# Patient Record
Sex: Male | Born: 1970 | Race: White | Hispanic: No | Marital: Married | Smoking: Current every day smoker
Health system: Southern US, Community
[De-identification: ages and names within clinical notes are randomized; demographics above are authoritative.]

## PROBLEM LIST (undated history)

## (undated) DIAGNOSIS — A0472 Enterocolitis due to Clostridium difficile, not specified as recurrent: Secondary | ICD-10-CM

## (undated) DIAGNOSIS — F10931 Alcohol use, unspecified with withdrawal delirium: Secondary | ICD-10-CM

## (undated) DIAGNOSIS — E039 Hypothyroidism, unspecified: Secondary | ICD-10-CM

## (undated) DIAGNOSIS — F10231 Alcohol dependence with withdrawal delirium: Secondary | ICD-10-CM

## (undated) DIAGNOSIS — B1920 Unspecified viral hepatitis C without hepatic coma: Secondary | ICD-10-CM

## (undated) DIAGNOSIS — R569 Unspecified convulsions: Secondary | ICD-10-CM

## (undated) DIAGNOSIS — G629 Polyneuropathy, unspecified: Secondary | ICD-10-CM

## (undated) DIAGNOSIS — D61818 Other pancytopenia: Secondary | ICD-10-CM

## (undated) DIAGNOSIS — I10 Essential (primary) hypertension: Secondary | ICD-10-CM

## (undated) DIAGNOSIS — K769 Liver disease, unspecified: Secondary | ICD-10-CM

## (undated) DIAGNOSIS — K7031 Alcoholic cirrhosis of liver with ascites: Secondary | ICD-10-CM

## (undated) HISTORY — PX: JOINT REPLACEMENT: SHX530

---

## 2007-05-10 ENCOUNTER — Ambulatory Visit: Payer: Self-pay | Admitting: Emergency Medicine

## 2010-03-19 ENCOUNTER — Ambulatory Visit: Payer: Self-pay | Admitting: Gastroenterology

## 2010-08-07 ENCOUNTER — Ambulatory Visit: Payer: Self-pay | Admitting: Gastroenterology

## 2011-03-28 ENCOUNTER — Ambulatory Visit: Payer: Self-pay | Admitting: Gastroenterology

## 2011-04-22 ENCOUNTER — Ambulatory Visit: Payer: Self-pay | Admitting: Gastroenterology

## 2011-04-23 LAB — PATHOLOGY REPORT

## 2011-09-05 ENCOUNTER — Ambulatory Visit: Payer: Self-pay | Admitting: Internal Medicine

## 2011-09-12 ENCOUNTER — Ambulatory Visit: Payer: Self-pay | Admitting: Internal Medicine

## 2011-09-15 ENCOUNTER — Ambulatory Visit: Payer: Self-pay | Admitting: Internal Medicine

## 2011-10-16 ENCOUNTER — Ambulatory Visit: Payer: Self-pay | Admitting: Internal Medicine

## 2012-10-03 ENCOUNTER — Emergency Department: Payer: Self-pay | Admitting: Emergency Medicine

## 2012-10-03 LAB — CBC WITH DIFFERENTIAL/PLATELET
Basophil #: 0 10*3/uL (ref 0.0–0.1)
Eosinophil #: 0 10*3/uL (ref 0.0–0.7)
Lymphocyte %: 7.4 %
MCH: 38.6 pg — ABNORMAL HIGH (ref 26.0–34.0)
MCV: 111 fL — ABNORMAL HIGH (ref 80–100)
Monocyte %: 8.3 %
Neutrophil %: 83.5 %
Platelet: 64 10*3/uL — ABNORMAL LOW (ref 150–440)
RBC: 3.38 10*6/uL — ABNORMAL LOW (ref 4.40–5.90)
RDW: 14.1 % (ref 11.5–14.5)

## 2012-10-03 LAB — DRUG SCREEN, URINE
Barbiturates, Ur Screen: NEGATIVE (ref ?–200)
Benzodiazepine, Ur Scrn: NEGATIVE (ref ?–200)
Methadone, Ur Screen: NEGATIVE (ref ?–300)
Phencyclidine (PCP) Ur S: NEGATIVE (ref ?–25)
Tricyclic, Ur Screen: NEGATIVE (ref ?–1000)

## 2012-10-03 LAB — BASIC METABOLIC PANEL
Anion Gap: 15 (ref 7–16)
BUN: 15 mg/dL (ref 7–18)
Calcium, Total: 9.3 mg/dL (ref 8.5–10.1)
Creatinine: 1.22 mg/dL (ref 0.60–1.30)
EGFR (African American): >60
EGFR (Non-African Amer.): >60
Glucose: 160 mg/dL — ABNORMAL HIGH (ref 65–99)
Sodium: 139 mmol/L (ref 136–145)

## 2012-10-03 LAB — ETHANOL: Ethanol: <3 mg/dL

## 2012-10-03 LAB — URINALYSIS, COMPLETE
Bacteria: NONE SEEN
Bilirubin,UR: NEGATIVE
Glucose,UR: NEGATIVE mg/dL (ref 0–75)
Nitrite: NEGATIVE
Ph: 9 (ref 4.5–8.0)
Specific Gravity: 1.023 (ref 1.003–1.030)
WBC UR: 2 /HPF (ref 0–5)

## 2012-10-12 ENCOUNTER — Ambulatory Visit: Payer: Self-pay | Admitting: Neurology

## 2012-10-13 ENCOUNTER — Ambulatory Visit: Payer: Self-pay | Admitting: Neurology

## 2014-01-05 ENCOUNTER — Inpatient Hospital Stay: Payer: Self-pay | Admitting: Internal Medicine

## 2014-01-05 LAB — PROTIME-INR
INR: 1.3
PROTHROMBIN TIME: 15.5 s — AB (ref 11.5–14.7)

## 2014-01-05 LAB — COMPREHENSIVE METABOLIC PANEL
ALT: 119 U/L — AB (ref 12–78)
Albumin: 2 g/dL — ABNORMAL LOW (ref 3.4–5.0)
Alkaline Phosphatase: 278 U/L — ABNORMAL HIGH
Anion Gap: 12 (ref 7–16)
BUN: 33 mg/dL — AB (ref 7–18)
Bilirubin,Total: 10.7 mg/dL — ABNORMAL HIGH (ref 0.2–1.0)
CALCIUM: 9.4 mg/dL (ref 8.5–10.1)
CHLORIDE: 86 mmol/L — AB (ref 98–107)
CO2: 23 mmol/L (ref 21–32)
CREATININE: 1.06 mg/dL (ref 0.60–1.30)
EGFR (Non-African Amer.): >60
GLUCOSE: 110 mg/dL — AB (ref 65–99)
OSMOLALITY: 252 (ref 275–301)
POTASSIUM: 4.7 mmol/L (ref 3.5–5.1)
SGOT(AST): 267 U/L — ABNORMAL HIGH (ref 15–37)
Sodium: 121 mmol/L — ABNORMAL LOW (ref 136–145)
Total Protein: 6.5 g/dL (ref 6.4–8.2)

## 2014-01-05 LAB — CBC WITH DIFFERENTIAL/PLATELET
BANDS NEUTROPHIL: 4 %
HCT: 29.1 % — AB (ref 40.0–52.0)
HGB: 9.7 g/dL — ABNORMAL LOW (ref 13.0–18.0)
Lymphocytes: 9 %
MCH: 41.4 pg — ABNORMAL HIGH (ref 26.0–34.0)
MCHC: 33.3 g/dL (ref 32.0–36.0)
MCV: 124 fL — ABNORMAL HIGH (ref 80–100)
Metamyelocyte: 3 %
Monocytes: 5 %
Platelet: 118 10*3/uL — ABNORMAL LOW (ref 150–440)
RBC: 2.34 10*6/uL — ABNORMAL LOW (ref 4.40–5.90)
RDW: 16.5 % — AB (ref 11.5–14.5)
SEGMENTED NEUTROPHILS: 79 %
WBC: 16.4 10*3/uL — AB (ref 3.8–10.6)

## 2014-01-05 LAB — LIPASE, BLOOD: LIPASE: 808 U/L — AB (ref 73–393)

## 2014-01-05 LAB — MAGNESIUM: MAGNESIUM: 1.8 mg/dL

## 2014-01-05 LAB — APTT: Activated PTT: 42.7 secs — ABNORMAL HIGH (ref 23.6–35.9)

## 2014-01-05 LAB — FOLATE: FOLIC ACID: 2.6 ng/mL — AB (ref 3.1–100.0)

## 2014-01-05 LAB — ETHANOL: Ethanol: <3 mg/dL

## 2014-01-05 LAB — TROPONIN I: Troponin-I: <0.02 ng/mL

## 2014-01-05 LAB — AMMONIA: Ammonia, Plasma: 46 mcmol/L — ABNORMAL HIGH (ref 11–32)

## 2014-01-06 LAB — DRUG SCREEN, URINE
AMPHETAMINES, UR SCREEN: NEGATIVE (ref ?–1000)
Barbiturates, Ur Screen: NEGATIVE (ref ?–200)
Benzodiazepine, Ur Scrn: NEGATIVE (ref ?–200)
Cannabinoid 50 Ng, Ur ~~LOC~~: NEGATIVE (ref ?–50)
Cocaine Metabolite,Ur ~~LOC~~: NEGATIVE (ref ?–300)
MDMA (ECSTASY) UR SCREEN: NEGATIVE (ref ?–500)
METHADONE, UR SCREEN: NEGATIVE (ref ?–300)
OPIATE, UR SCREEN: NEGATIVE (ref ?–300)
Phencyclidine (PCP) Ur S: NEGATIVE (ref ?–25)
TRICYCLIC, UR SCREEN: NEGATIVE (ref ?–1000)

## 2014-01-06 LAB — IRON AND TIBC
IRON: 111 ug/dL (ref 65–175)
Iron Bind.Cap.(Total): 96 ug/dL — ABNORMAL LOW (ref 250–450)

## 2014-01-06 LAB — CBC WITH DIFFERENTIAL/PLATELET
Bands: 8 %
HCT: 24.9 % — ABNORMAL LOW (ref 40.0–52.0)
HGB: 8.4 g/dL — ABNORMAL LOW (ref 13.0–18.0)
Lymphocytes: 14 %
MCH: 41.6 pg — ABNORMAL HIGH (ref 26.0–34.0)
MCHC: 33.7 g/dL (ref 32.0–36.0)
MCV: 124 fL — ABNORMAL HIGH (ref 80–100)
Metamyelocyte: 2 %
Monocytes: 3 %
NRBC/100 WBC: 2 /
Platelet: 99 10*3/uL — ABNORMAL LOW (ref 150–440)
RBC: 2.02 10*6/uL — ABNORMAL LOW (ref 4.40–5.90)
RDW: 16.5 % — ABNORMAL HIGH (ref 11.5–14.5)
Segmented Neutrophils: 73 %
WBC: 17.4 10*3/uL — ABNORMAL HIGH (ref 3.8–10.6)

## 2014-01-06 LAB — COMPREHENSIVE METABOLIC PANEL
ALBUMIN: 1.7 g/dL — AB (ref 3.4–5.0)
Alkaline Phosphatase: 236 U/L — ABNORMAL HIGH
Anion Gap: 9 (ref 7–16)
BILIRUBIN TOTAL: 10 mg/dL — AB (ref 0.2–1.0)
BUN: 39 mg/dL — ABNORMAL HIGH (ref 7–18)
CALCIUM: 8.8 mg/dL (ref 8.5–10.1)
Chloride: 89 mmol/L — ABNORMAL LOW (ref 98–107)
Co2: 23 mmol/L (ref 21–32)
Creatinine: 1.14 mg/dL (ref 0.60–1.30)
EGFR (African American): >60
EGFR (Non-African Amer.): >60
Glucose: 126 mg/dL — ABNORMAL HIGH (ref 65–99)
OSMOLALITY: 255 (ref 275–301)
Potassium: 5.1 mmol/L (ref 3.5–5.1)
SGOT(AST): 232 U/L — ABNORMAL HIGH (ref 15–37)
SGPT (ALT): 100 U/L — ABNORMAL HIGH (ref 12–78)
Sodium: 121 mmol/L — ABNORMAL LOW (ref 136–145)
Total Protein: 5.9 g/dL — ABNORMAL LOW (ref 6.4–8.2)

## 2014-01-06 LAB — URINALYSIS, COMPLETE
BACTERIA: NONE SEEN
GLUCOSE, UR: NEGATIVE mg/dL (ref 0–75)
LEUKOCYTE ESTERASE: NEGATIVE
Nitrite: NEGATIVE
PROTEIN: NEGATIVE
Ph: 6 (ref 4.5–8.0)
RBC,UR: 1 /HPF (ref 0–5)
Specific Gravity: 1.011 (ref 1.003–1.030)
Squamous Epithelial: 2
WBC UR: 4 /HPF (ref 0–5)

## 2014-01-06 LAB — FERRITIN: Ferritin (ARMC): 1303 ng/mL — ABNORMAL HIGH (ref 8–388)

## 2014-01-06 LAB — TSH: Thyroid Stimulating Horm: 24.4 u[IU]/mL — ABNORMAL HIGH

## 2014-01-06 LAB — OSMOLALITY, URINE: Osmolality: 291 mOsm/kg

## 2014-01-06 LAB — OSMOLALITY: Osmolality: 276 mOsm/kg (ref 275–295)

## 2014-01-07 LAB — BASIC METABOLIC PANEL
Anion Gap: 10 (ref 7–16)
BUN: 39 mg/dL — ABNORMAL HIGH (ref 7–18)
CHLORIDE: 94 mmol/L — AB (ref 98–107)
Calcium, Total: 9 mg/dL (ref 8.5–10.1)
Co2: 22 mmol/L (ref 21–32)
Creatinine: 1.19 mg/dL (ref 0.60–1.30)
EGFR (Non-African Amer.): >60
Glucose: 120 mg/dL — ABNORMAL HIGH (ref 65–99)
OSMOLALITY: 264 (ref 275–301)
POTASSIUM: 4.4 mmol/L (ref 3.5–5.1)
Sodium: 126 mmol/L — ABNORMAL LOW (ref 136–145)

## 2014-01-07 LAB — CBC WITH DIFFERENTIAL/PLATELET
Bands: 3 %
HCT: 24.1 % — AB (ref 40.0–52.0)
HGB: 8 g/dL — ABNORMAL LOW (ref 13.0–18.0)
LYMPHS PCT: 11 %
MCH: 40.9 pg — ABNORMAL HIGH (ref 26.0–34.0)
MCHC: 33.2 g/dL (ref 32.0–36.0)
MCV: 123 fL — AB (ref 80–100)
Monocytes: 10 %
NRBC/100 WBC: 11 /
PLATELETS: 100 10*3/uL — AB (ref 150–440)
RBC: 1.96 10*6/uL — ABNORMAL LOW (ref 4.40–5.90)
RDW: 16.5 % — ABNORMAL HIGH (ref 11.5–14.5)
Segmented Neutrophils: 76 %
WBC: 18.6 10*3/uL — AB (ref 3.8–10.6)

## 2014-01-07 LAB — URINALYSIS, COMPLETE
Glucose,UR: 50 mg/dL (ref 0–75)
Nitrite: NEGATIVE
Ph: 5 (ref 4.5–8.0)
Protein: NEGATIVE
RBC,UR: 78 /HPF (ref 0–5)
Specific Gravity: 1.036 (ref 1.003–1.030)
Squamous Epithelial: 1

## 2014-01-07 LAB — AMMONIA: Ammonia, Plasma: <10 mcmol/L (ref 11–32)

## 2014-01-10 LAB — CBC WITH DIFFERENTIAL/PLATELET
BANDS NEUTROPHIL: 3 %
HCT: 25.5 % — ABNORMAL LOW (ref 40.0–52.0)
HGB: 8.2 g/dL — ABNORMAL LOW (ref 13.0–18.0)
Lymphocytes: 3 %
MCH: 40.5 pg — ABNORMAL HIGH (ref 26.0–34.0)
MCHC: 32.1 g/dL (ref 32.0–36.0)
MCV: 126 fL — ABNORMAL HIGH (ref 80–100)
Monocytes: 3 %
NRBC/100 WBC: 1 /
PLATELETS: 92 10*3/uL — AB (ref 150–440)
RBC: 2.02 10*6/uL — ABNORMAL LOW (ref 4.40–5.90)
RDW: 18.1 % — ABNORMAL HIGH (ref 11.5–14.5)
Segmented Neutrophils: 91 %
WBC: 30.2 10*3/uL — ABNORMAL HIGH (ref 3.8–10.6)

## 2014-01-10 LAB — COMPREHENSIVE METABOLIC PANEL
ALT: 85 U/L — AB (ref 12–78)
Albumin: 1.9 g/dL — ABNORMAL LOW (ref 3.4–5.0)
Alkaline Phosphatase: 201 U/L — ABNORMAL HIGH
Anion Gap: 9 (ref 7–16)
BUN: 40 mg/dL — ABNORMAL HIGH (ref 7–18)
Bilirubin,Total: 11.5 mg/dL — ABNORMAL HIGH (ref 0.2–1.0)
CALCIUM: 8.8 mg/dL (ref 8.5–10.1)
Chloride: 98 mmol/L (ref 98–107)
Co2: 27 mmol/L (ref 21–32)
Creatinine: 1.18 mg/dL (ref 0.60–1.30)
EGFR (Non-African Amer.): >60
GLUCOSE: 151 mg/dL — AB (ref 65–99)
OSMOLALITY: 281 (ref 275–301)
POTASSIUM: 3.4 mmol/L — AB (ref 3.5–5.1)
SGOT(AST): 151 U/L — ABNORMAL HIGH (ref 15–37)
Sodium: 134 mmol/L — ABNORMAL LOW (ref 136–145)
Total Protein: 5.7 g/dL — ABNORMAL LOW (ref 6.4–8.2)

## 2014-01-10 LAB — OCCULT BLOOD X 1 CARD TO LAB, STOOL: OCCULT BLOOD, FECES: POSITIVE

## 2014-01-10 LAB — AMMONIA: Ammonia, Plasma: <10 mcmol/L (ref 11–32)

## 2014-01-11 LAB — CBC WITH DIFFERENTIAL/PLATELET
BANDS NEUTROPHIL: 5 %
Comment - H1-Com6: NORMAL
HCT: 29.4 % — ABNORMAL LOW (ref 40.0–52.0)
HGB: 9.3 g/dL — ABNORMAL LOW (ref 13.0–18.0)
Lymphocytes: 2 %
MCH: 39.5 pg — ABNORMAL HIGH (ref 26.0–34.0)
MCHC: 31.5 g/dL — ABNORMAL LOW (ref 32.0–36.0)
MCV: 126 fL — ABNORMAL HIGH (ref 80–100)
MYELOCYTE: 1 %
Metamyelocyte: 2 %
Monocytes: 2 %
NRBC/100 WBC: 1 /
Platelet: 110 10*3/uL — ABNORMAL LOW (ref 150–440)
RBC: 2.34 10*6/uL — ABNORMAL LOW (ref 4.40–5.90)
RDW: 17.3 % — AB (ref 11.5–14.5)
Segmented Neutrophils: 88 %
WBC: 31.1 10*3/uL — AB (ref 3.8–10.6)

## 2014-01-11 LAB — OCCULT BLOOD X 1 CARD TO LAB, STOOL: Occult Blood, Feces: POSITIVE

## 2014-01-13 LAB — CBC WITH DIFFERENTIAL/PLATELET
Bands: 2 %
HCT: 27.5 % — ABNORMAL LOW (ref 40.0–52.0)
HGB: 9 g/dL — AB (ref 13.0–18.0)
LYMPHS PCT: 2 %
MCH: 40.5 pg — AB (ref 26.0–34.0)
MCHC: 32.5 g/dL (ref 32.0–36.0)
MCV: 124 fL — AB (ref 80–100)
MONOS PCT: 4 %
Myelocyte: 2 %
NRBC/100 WBC: 3 /
PLATELETS: 122 10*3/uL — AB (ref 150–440)
RBC: 2.21 10*6/uL — ABNORMAL LOW (ref 4.40–5.90)
RDW: 17 % — ABNORMAL HIGH (ref 11.5–14.5)
SEGMENTED NEUTROPHILS: 90 %
WBC: 30.7 10*3/uL — ABNORMAL HIGH (ref 3.8–10.6)

## 2014-01-13 LAB — COMPREHENSIVE METABOLIC PANEL
ALT: 109 U/L — AB (ref 12–78)
AST: 172 U/L — AB (ref 15–37)
Albumin: 1.9 g/dL — ABNORMAL LOW (ref 3.4–5.0)
Alkaline Phosphatase: 208 U/L — ABNORMAL HIGH
Anion Gap: 8 (ref 7–16)
BILIRUBIN TOTAL: 11.3 mg/dL — AB (ref 0.2–1.0)
BUN: 47 mg/dL — ABNORMAL HIGH (ref 7–18)
Calcium, Total: 8.9 mg/dL (ref 8.5–10.1)
Chloride: 96 mmol/L — ABNORMAL LOW (ref 98–107)
Co2: 29 mmol/L (ref 21–32)
Creatinine: 1.47 mg/dL — ABNORMAL HIGH (ref 0.60–1.30)
EGFR (African American): >60
EGFR (Non-African Amer.): 58 — ABNORMAL LOW
GLUCOSE: 157 mg/dL — AB (ref 65–99)
Osmolality: 282 (ref 275–301)
Potassium: 3.1 mmol/L — ABNORMAL LOW (ref 3.5–5.1)
SODIUM: 133 mmol/L — AB (ref 136–145)
Total Protein: 5.8 g/dL — ABNORMAL LOW (ref 6.4–8.2)

## 2014-01-13 LAB — PROTIME-INR
INR: 1.3
Prothrombin Time: 15.9 secs — ABNORMAL HIGH (ref 11.5–14.7)

## 2014-01-14 LAB — BASIC METABOLIC PANEL
ANION GAP: 7 (ref 7–16)
BUN: 42 mg/dL — AB (ref 7–18)
CREATININE: 1.31 mg/dL — AB (ref 0.60–1.30)
Calcium, Total: 8.6 mg/dL (ref 8.5–10.1)
Chloride: 95 mmol/L — ABNORMAL LOW (ref 98–107)
Co2: 29 mmol/L (ref 21–32)
EGFR (African American): >60
EGFR (Non-African Amer.): >60
Glucose: 205 mg/dL — ABNORMAL HIGH (ref 65–99)
OSMOLALITY: 279 (ref 275–301)
Potassium: 3.1 mmol/L — ABNORMAL LOW (ref 3.5–5.1)
SODIUM: 131 mmol/L — AB (ref 136–145)

## 2014-01-14 LAB — OCCULT BLOOD X 1 CARD TO LAB, STOOL: Occult Blood, Feces: NEGATIVE

## 2014-01-14 LAB — CLOSTRIDIUM DIFFICILE(ARMC)

## 2014-01-15 LAB — BASIC METABOLIC PANEL
Anion Gap: 7 (ref 7–16)
BUN: 35 mg/dL — ABNORMAL HIGH (ref 7–18)
CALCIUM: 8.9 mg/dL (ref 8.5–10.1)
CHLORIDE: 97 mmol/L — AB (ref 98–107)
CO2: 31 mmol/L (ref 21–32)
Creatinine: 0.97 mg/dL (ref 0.60–1.30)
EGFR (Non-African Amer.): >60
Glucose: 162 mg/dL — ABNORMAL HIGH (ref 65–99)
OSMOLALITY: 282 (ref 275–301)
Potassium: 3.2 mmol/L — ABNORMAL LOW (ref 3.5–5.1)
SODIUM: 135 mmol/L — AB (ref 136–145)

## 2014-01-15 LAB — CBC WITH DIFFERENTIAL/PLATELET
Bands: 2 %
Comment - H1-Com3: NORMAL
HCT: 28.9 % — ABNORMAL LOW (ref 40.0–52.0)
HGB: 9.3 g/dL — ABNORMAL LOW (ref 13.0–18.0)
LYMPHS PCT: 3 %
MCH: 39.6 pg — ABNORMAL HIGH (ref 26.0–34.0)
MCHC: 32.1 g/dL (ref 32.0–36.0)
MCV: 124 fL — AB (ref 80–100)
METAMYELOCYTE: 2 %
MONOS PCT: 7 %
Myelocyte: 1 %
Platelet: 116 10*3/uL — ABNORMAL LOW (ref 150–440)
RBC: 2.34 10*6/uL — AB (ref 4.40–5.90)
RDW: 17 % — ABNORMAL HIGH (ref 11.5–14.5)
Segmented Neutrophils: 85 %
WBC: 32 10*3/uL — AB (ref 3.8–10.6)

## 2014-01-16 LAB — CBC WITH DIFFERENTIAL/PLATELET
BASOS ABS: 2.9 10*3/uL — AB (ref 0.0–0.1)
BASOS PCT: 9.8 %
Bands: 4 %
Comment - H1-Com1: NORMAL
Eosinophil #: 0 10*3/uL (ref 0.0–0.7)
Eosinophil %: 0 %
HCT: 26.9 % — AB (ref 40.0–52.0)
HGB: 8.8 g/dL — AB (ref 13.0–18.0)
LYMPHS ABS: 0.5 10*3/uL — AB (ref 1.0–3.6)
LYMPHS PCT: 4 %
Lymphocyte %: 1.8 %
MCH: 40.5 pg — AB (ref 26.0–34.0)
MCHC: 32.8 g/dL (ref 32.0–36.0)
MCV: 124 fL — AB (ref 80–100)
Metamyelocyte: 2 %
Monocyte #: 1.4 x10 3/mm — ABNORMAL HIGH (ref 0.2–1.0)
Monocyte %: 4.8 %
Monocytes: 4 %
NEUTROS ABS: 24.4 10*3/uL — AB (ref 1.4–6.5)
NEUTROS PCT: 83.6 %
Platelet: 109 10*3/uL — ABNORMAL LOW (ref 150–440)
RBC: 2.17 10*6/uL — AB (ref 4.40–5.90)
RDW: 16.6 % — ABNORMAL HIGH (ref 11.5–14.5)
Segmented Neutrophils: 82 %
WBC: 29.2 10*3/uL — ABNORMAL HIGH (ref 3.8–10.6)

## 2014-01-16 LAB — HEPATIC FUNCTION PANEL A (ARMC)
AST: 155 U/L — AB (ref 15–37)
Albumin: 1.8 g/dL — ABNORMAL LOW (ref 3.4–5.0)
Alkaline Phosphatase: 189 U/L — ABNORMAL HIGH
BILIRUBIN DIRECT: 8 mg/dL — AB (ref 0.00–0.20)
BILIRUBIN TOTAL: 9.9 mg/dL — AB (ref 0.2–1.0)
SGPT (ALT): 125 U/L — ABNORMAL HIGH (ref 12–78)
Total Protein: 5.6 g/dL — ABNORMAL LOW (ref 6.4–8.2)

## 2014-01-17 LAB — CBC WITH DIFFERENTIAL/PLATELET
BASOS ABS: 0.4 10*3/uL — AB (ref 0.0–0.1)
Basophil %: 1.2 %
Eosinophil #: 0 10*3/uL (ref 0.0–0.7)
Eosinophil %: 0 %
HCT: 28.1 % — ABNORMAL LOW (ref 40.0–52.0)
HGB: 9.3 g/dL — AB (ref 13.0–18.0)
LYMPHS PCT: 1.9 %
Lymphocyte #: 0.6 10*3/uL — ABNORMAL LOW (ref 1.0–3.6)
MCH: 40.7 pg — ABNORMAL HIGH (ref 26.0–34.0)
MCHC: 33 g/dL (ref 32.0–36.0)
MCV: 123 fL — ABNORMAL HIGH (ref 80–100)
MONO ABS: 1.2 x10 3/mm — AB (ref 0.2–1.0)
Monocyte %: 3.9 %
Neutrophil #: 28.5 10*3/uL — ABNORMAL HIGH (ref 1.4–6.5)
Neutrophil %: 93 %
Platelet: 100 10*3/uL — ABNORMAL LOW (ref 150–440)
RBC: 2.28 10*6/uL — ABNORMAL LOW (ref 4.40–5.90)
RDW: 16.4 % — ABNORMAL HIGH (ref 11.5–14.5)
WBC: 30.7 10*3/uL — ABNORMAL HIGH (ref 3.8–10.6)

## 2014-01-17 LAB — CULTURE, BLOOD (SINGLE)

## 2014-02-06 ENCOUNTER — Inpatient Hospital Stay: Payer: Self-pay | Admitting: Internal Medicine

## 2014-02-06 LAB — COMPREHENSIVE METABOLIC PANEL
ANION GAP: 6 — AB (ref 7–16)
AST: 116 U/L — AB (ref 15–37)
Albumin: 2.1 g/dL — ABNORMAL LOW (ref 3.4–5.0)
Alkaline Phosphatase: 213 U/L — ABNORMAL HIGH
BUN: 29 mg/dL — ABNORMAL HIGH (ref 7–18)
Bilirubin,Total: 3.8 mg/dL — ABNORMAL HIGH (ref 0.2–1.0)
Calcium, Total: 8.2 mg/dL — ABNORMAL LOW (ref 8.5–10.1)
Chloride: 103 mmol/L (ref 98–107)
Co2: 30 mmol/L (ref 21–32)
Creatinine: 0.84 mg/dL (ref 0.60–1.30)
Glucose: 166 mg/dL — ABNORMAL HIGH (ref 65–99)
Osmolality: 287 (ref 275–301)
Potassium: 3.5 mmol/L (ref 3.5–5.1)
SGPT (ALT): 127 U/L — ABNORMAL HIGH (ref 12–78)
Sodium: 139 mmol/L (ref 136–145)
Total Protein: 5.9 g/dL — ABNORMAL LOW (ref 6.4–8.2)

## 2014-02-06 LAB — PROTIME-INR
INR: 1.2
PROTHROMBIN TIME: 14.7 s (ref 11.5–14.7)

## 2014-02-06 LAB — URINALYSIS, COMPLETE
BACTERIA: NONE SEEN
Bilirubin,UR: NEGATIVE
Blood: NEGATIVE
Glucose,UR: NEGATIVE mg/dL (ref 0–75)
Hyaline Cast: 3
KETONE: NEGATIVE
Leukocyte Esterase: NEGATIVE
NITRITE: NEGATIVE
PROTEIN: NEGATIVE
Ph: 5 (ref 4.5–8.0)
RBC,UR: <1 /HPF (ref 0–5)
Specific Gravity: 1.011 (ref 1.003–1.030)
Squamous Epithelial: <1
WBC UR: 1 /HPF (ref 0–5)

## 2014-02-06 LAB — CBC WITH DIFFERENTIAL/PLATELET
Basophil #: 0 10*3/uL (ref 0.0–0.1)
Basophil %: 0.2 %
EOS ABS: 0 10*3/uL (ref 0.0–0.7)
EOS PCT: 0.1 %
HCT: 29.6 % — ABNORMAL LOW (ref 40.0–52.0)
HGB: 9.3 g/dL — AB (ref 13.0–18.0)
Lymphocyte #: 0.6 10*3/uL — ABNORMAL LOW (ref 1.0–3.6)
Lymphocyte %: 2.4 %
MCH: 36.9 pg — AB (ref 26.0–34.0)
MCHC: 31.5 g/dL — AB (ref 32.0–36.0)
MCV: 117 fL — AB (ref 80–100)
MONO ABS: 0.7 x10 3/mm (ref 0.2–1.0)
Monocyte %: 3.2 %
NEUTROS ABS: 21.5 10*3/uL — AB (ref 1.4–6.5)
Neutrophil %: 94.1 %
Platelet: 78 10*3/uL — ABNORMAL LOW (ref 150–440)
RBC: 2.53 10*6/uL — ABNORMAL LOW (ref 4.40–5.90)
RDW: 14.7 % — ABNORMAL HIGH (ref 11.5–14.5)
WBC: 22.9 10*3/uL — ABNORMAL HIGH (ref 3.8–10.6)

## 2014-02-06 LAB — ETHANOL

## 2014-02-06 LAB — DRUG SCREEN, URINE

## 2014-02-06 LAB — LIPASE, BLOOD: Lipase: 169 U/L (ref 73–393)

## 2014-02-06 LAB — TROPONIN I: Troponin-I: 0.03 ng/mL

## 2014-02-06 LAB — AMMONIA: Ammonia, Plasma: 43 mcmol/L — ABNORMAL HIGH (ref 11–32)

## 2014-02-06 LAB — APTT: Activated PTT: 35.7 secs (ref 23.6–35.9)

## 2014-02-06 LAB — MAGNESIUM: Magnesium: 1.2 mg/dL — ABNORMAL LOW

## 2014-02-07 LAB — COMPREHENSIVE METABOLIC PANEL
ALBUMIN: 1.8 g/dL — AB (ref 3.4–5.0)
ALK PHOS: 227 U/L — AB
Anion Gap: 6 — ABNORMAL LOW (ref 7–16)
BUN: 25 mg/dL — AB (ref 7–18)
Bilirubin,Total: 3.3 mg/dL — ABNORMAL HIGH (ref 0.2–1.0)
Calcium, Total: 7.9 mg/dL — ABNORMAL LOW (ref 8.5–10.1)
Chloride: 101 mmol/L (ref 98–107)
Co2: 32 mmol/L (ref 21–32)
Creatinine: 0.88 mg/dL (ref 0.60–1.30)
EGFR (African American): >60
Glucose: 134 mg/dL — ABNORMAL HIGH (ref 65–99)
Osmolality: 284 (ref 275–301)
POTASSIUM: 3.2 mmol/L — AB (ref 3.5–5.1)
SGOT(AST): 92 U/L — ABNORMAL HIGH (ref 15–37)
SGPT (ALT): 106 U/L — ABNORMAL HIGH (ref 12–78)
Sodium: 139 mmol/L (ref 136–145)
Total Protein: 5.5 g/dL — ABNORMAL LOW (ref 6.4–8.2)

## 2014-02-07 LAB — CBC WITH DIFFERENTIAL/PLATELET
Basophil #: 0.1 10*3/uL (ref 0.0–0.1)
Basophil %: 0.3 %
Eosinophil #: 0 10*3/uL (ref 0.0–0.7)
Eosinophil %: 0 %
HCT: 24.7 % — AB (ref 40.0–52.0)
HGB: 8.2 g/dL — AB (ref 13.0–18.0)
Lymphocyte #: 0.8 10*3/uL — ABNORMAL LOW (ref 1.0–3.6)
Lymphocyte %: 4 %
MCH: 38.6 pg — ABNORMAL HIGH (ref 26.0–34.0)
MCHC: 33 g/dL (ref 32.0–36.0)
MCV: 117 fL — ABNORMAL HIGH (ref 80–100)
MONO ABS: 0.9 x10 3/mm (ref 0.2–1.0)
MONOS PCT: 4.4 %
Neutrophil #: 17.7 10*3/uL — ABNORMAL HIGH (ref 1.4–6.5)
Neutrophil %: 91.3 %
PLATELETS: 63 10*3/uL — AB (ref 150–440)
RBC: 2.11 10*6/uL — AB (ref 4.40–5.90)
RDW: 14.6 % — ABNORMAL HIGH (ref 11.5–14.5)
WBC: 19.4 10*3/uL — AB (ref 3.8–10.6)

## 2014-02-07 LAB — AMMONIA: Ammonia, Plasma: 81 mcmol/L — ABNORMAL HIGH (ref 11–32)

## 2014-02-07 LAB — MAGNESIUM: Magnesium: 1.6 mg/dL — ABNORMAL LOW

## 2014-02-08 LAB — CBC WITH DIFFERENTIAL/PLATELET
Basophil #: 0 10*3/uL (ref 0.0–0.1)
Basophil %: 0.3 %
Eosinophil #: 0 10*3/uL (ref 0.0–0.7)
Eosinophil %: 0.1 %
HCT: 26.1 % — AB (ref 40.0–52.0)
HGB: 8.8 g/dL — AB (ref 13.0–18.0)
Lymphocyte #: 0.9 10*3/uL — ABNORMAL LOW (ref 1.0–3.6)
Lymphocyte %: 6.3 %
MCH: 38.9 pg — AB (ref 26.0–34.0)
MCHC: 33.6 g/dL (ref 32.0–36.0)
MCV: 116 fL — AB (ref 80–100)
Monocyte #: 0.9 x10 3/mm (ref 0.2–1.0)
Monocyte %: 5.9 %
NEUTROS ABS: 13.1 10*3/uL — AB (ref 1.4–6.5)
Neutrophil %: 87.4 %
Platelet: 59 10*3/uL — ABNORMAL LOW (ref 150–440)
RBC: 2.26 10*6/uL — ABNORMAL LOW (ref 4.40–5.90)
RDW: 14.4 % (ref 11.5–14.5)
WBC: 14.9 10*3/uL — ABNORMAL HIGH (ref 3.8–10.6)

## 2014-02-08 LAB — BODY FLUID CELL COUNT WITH DIFFERENTIAL
Basophil: 0 %
EOS PCT: 0 %
LYMPHS PCT: 21 %
Neutrophils: 56 %
Nucleated Cell Count: 277 /mm3
OTHER CELLS BF: 0 %
OTHER MONONUCLEAR CELLS: 23 %

## 2014-02-08 LAB — ALBUMIN, FLUID (OTHER): Body Fluid Albumin: 0.6 g/dL

## 2014-02-08 LAB — PROTEIN, BODY FLUID: Protein, Body Fluid: 1.2 g/dL

## 2014-02-09 LAB — MAGNESIUM: MAGNESIUM: 1 mg/dL — AB

## 2014-02-09 LAB — COMPREHENSIVE METABOLIC PANEL
ALK PHOS: 225 U/L — AB
ANION GAP: 3 — AB (ref 7–16)
Albumin: 2 g/dL — ABNORMAL LOW (ref 3.4–5.0)
BUN: 21 mg/dL — ABNORMAL HIGH (ref 7–18)
Bilirubin,Total: 2.8 mg/dL — ABNORMAL HIGH (ref 0.2–1.0)
Calcium, Total: 7.8 mg/dL — ABNORMAL LOW (ref 8.5–10.1)
Chloride: 101 mmol/L (ref 98–107)
Co2: 36 mmol/L — ABNORMAL HIGH (ref 21–32)
Creatinine: 0.91 mg/dL (ref 0.60–1.30)
EGFR (African American): >60
EGFR (Non-African Amer.): >60
Glucose: 177 mg/dL — ABNORMAL HIGH (ref 65–99)
Osmolality: 287 (ref 275–301)
Potassium: 3.2 mmol/L — ABNORMAL LOW (ref 3.5–5.1)
SGOT(AST): 74 U/L — ABNORMAL HIGH (ref 15–37)
SGPT (ALT): 86 U/L — ABNORMAL HIGH (ref 12–78)
Sodium: 140 mmol/L (ref 136–145)
TOTAL PROTEIN: 5.6 g/dL — AB (ref 6.4–8.2)

## 2014-02-09 LAB — AMMONIA: Ammonia, Plasma: 66 mcmol/L — ABNORMAL HIGH (ref 11–32)

## 2014-02-10 LAB — CBC WITH DIFFERENTIAL/PLATELET
BASOS PCT: 0.4 %
Basophil #: 0.1 10*3/uL (ref 0.0–0.1)
Eosinophil #: 0 10*3/uL (ref 0.0–0.7)
Eosinophil %: 0.1 %
HCT: 27 % — ABNORMAL LOW (ref 40.0–52.0)
HGB: 8.9 g/dL — ABNORMAL LOW (ref 13.0–18.0)
LYMPHS ABS: 1 10*3/uL (ref 1.0–3.6)
LYMPHS PCT: 4.7 %
MCH: 38.1 pg — ABNORMAL HIGH (ref 26.0–34.0)
MCHC: 33 g/dL (ref 32.0–36.0)
MCV: 115 fL — AB (ref 80–100)
MONO ABS: 1 x10 3/mm (ref 0.2–1.0)
Monocyte %: 4.7 %
Neutrophil #: 18.5 10*3/uL — ABNORMAL HIGH (ref 1.4–6.5)
Neutrophil %: 90.1 %
PLATELETS: 61 10*3/uL — AB (ref 150–440)
RBC: 2.34 10*6/uL — AB (ref 4.40–5.90)
RDW: 14.7 % — ABNORMAL HIGH (ref 11.5–14.5)
WBC: 20.6 10*3/uL — ABNORMAL HIGH (ref 3.8–10.6)

## 2014-02-10 LAB — COMPREHENSIVE METABOLIC PANEL
ALK PHOS: 236 U/L — AB
ALT: 87 U/L — AB (ref 12–78)
ANION GAP: 2 — AB (ref 7–16)
AST: 66 U/L — AB (ref 15–37)
Albumin: 2 g/dL — ABNORMAL LOW (ref 3.4–5.0)
BUN: 18 mg/dL (ref 7–18)
Bilirubin,Total: 2.6 mg/dL — ABNORMAL HIGH (ref 0.2–1.0)
CALCIUM: 8.3 mg/dL — AB (ref 8.5–10.1)
CO2: 38 mmol/L — AB (ref 21–32)
Chloride: 103 mmol/L (ref 98–107)
Creatinine: 0.79 mg/dL (ref 0.60–1.30)
EGFR (African American): >60
Glucose: 108 mg/dL — ABNORMAL HIGH (ref 65–99)
Osmolality: 287 (ref 275–301)
Potassium: 3.1 mmol/L — ABNORMAL LOW (ref 3.5–5.1)
SODIUM: 143 mmol/L (ref 136–145)
TOTAL PROTEIN: 6 g/dL — AB (ref 6.4–8.2)

## 2014-02-10 LAB — AMMONIA: Ammonia, Plasma: 49 mcmol/L — ABNORMAL HIGH (ref 11–32)

## 2014-02-11 LAB — POTASSIUM: POTASSIUM: 3.2 mmol/L — AB (ref 3.5–5.1)

## 2014-02-11 LAB — MAGNESIUM: MAGNESIUM: 1.1 mg/dL — AB

## 2014-02-11 LAB — CULTURE, BLOOD (SINGLE)

## 2014-02-12 ENCOUNTER — Ambulatory Visit: Payer: Self-pay | Admitting: Internal Medicine

## 2014-02-12 LAB — CBC WITH DIFFERENTIAL/PLATELET
BASOS PCT: 0.9 %
Basophil #: 0.2 10*3/uL — ABNORMAL HIGH (ref 0.0–0.1)
EOS ABS: 0.1 10*3/uL (ref 0.0–0.7)
EOS PCT: 0.5 %
HCT: 25.6 % — ABNORMAL LOW (ref 40.0–52.0)
HGB: 8.5 g/dL — ABNORMAL LOW (ref 13.0–18.0)
LYMPHS ABS: 1.1 10*3/uL (ref 1.0–3.6)
Lymphocyte %: 7 %
MCH: 38.3 pg — ABNORMAL HIGH (ref 26.0–34.0)
MCHC: 33.3 g/dL (ref 32.0–36.0)
MCV: 115 fL — AB (ref 80–100)
MONOS PCT: 4.1 %
Monocyte #: 0.7 x10 3/mm (ref 0.2–1.0)
Neutrophil #: 14.3 10*3/uL — ABNORMAL HIGH (ref 1.4–6.5)
Neutrophil %: 87.5 %
PLATELETS: 69 10*3/uL — AB (ref 150–440)
RBC: 2.23 10*6/uL — ABNORMAL LOW (ref 4.40–5.90)
RDW: 15.1 % — ABNORMAL HIGH (ref 11.5–14.5)
WBC: 16.3 10*3/uL — AB (ref 3.8–10.6)

## 2014-02-12 LAB — BASIC METABOLIC PANEL
Anion Gap: 1 — ABNORMAL LOW (ref 7–16)
BUN: 15 mg/dL (ref 7–18)
CHLORIDE: 101 mmol/L (ref 98–107)
CREATININE: 0.73 mg/dL (ref 0.60–1.30)
Calcium, Total: 7.9 mg/dL — ABNORMAL LOW (ref 8.5–10.1)
Co2: 39 mmol/L — ABNORMAL HIGH (ref 21–32)
EGFR (African American): >60
GLUCOSE: 93 mg/dL (ref 65–99)
Osmolality: 282 (ref 275–301)
POTASSIUM: 3.1 mmol/L — AB (ref 3.5–5.1)
SODIUM: 141 mmol/L (ref 136–145)

## 2014-02-12 LAB — MAGNESIUM: Magnesium: 1.2 mg/dL — ABNORMAL LOW

## 2014-02-12 LAB — TSH: Thyroid Stimulating Horm: 60.4 u[IU]/mL — ABNORMAL HIGH

## 2014-02-12 LAB — BODY FLUID CULTURE

## 2014-02-12 LAB — AMMONIA: Ammonia, Plasma: 44 mcmol/L — ABNORMAL HIGH (ref 11–32)

## 2014-02-13 LAB — MAGNESIUM: MAGNESIUM: 1.3 mg/dL — AB

## 2014-02-13 LAB — POTASSIUM: Potassium: 3.3 mmol/L — ABNORMAL LOW (ref 3.5–5.1)

## 2014-02-14 LAB — CBC WITH DIFFERENTIAL/PLATELET
Basophil #: 0 10*3/uL (ref 0.0–0.1)
Basophil %: 0.2 %
Eosinophil #: 0.1 10*3/uL (ref 0.0–0.7)
Eosinophil %: 0.5 %
HCT: 26.7 % — ABNORMAL LOW (ref 40.0–52.0)
HGB: 8.9 g/dL — ABNORMAL LOW (ref 13.0–18.0)
Lymphocyte #: 1.1 10*3/uL (ref 1.0–3.6)
Lymphocyte %: 5.7 %
MCH: 38.2 pg — ABNORMAL HIGH (ref 26.0–34.0)
MCHC: 33.1 g/dL (ref 32.0–36.0)
MCV: 115 fL — ABNORMAL HIGH (ref 80–100)
MONOS PCT: 3 %
Monocyte #: 0.6 x10 3/mm (ref 0.2–1.0)
NEUTROS PCT: 90.6 %
Neutrophil #: 18 10*3/uL — ABNORMAL HIGH (ref 1.4–6.5)
PLATELETS: 87 10*3/uL — AB (ref 150–440)
RBC: 2.32 10*6/uL — ABNORMAL LOW (ref 4.40–5.90)
RDW: 15.2 % — ABNORMAL HIGH (ref 11.5–14.5)
WBC: 19.8 10*3/uL — ABNORMAL HIGH (ref 3.8–10.6)

## 2014-02-14 LAB — BASIC METABOLIC PANEL
Anion Gap: 3 — ABNORMAL LOW (ref 7–16)
BUN: 13 mg/dL (ref 7–18)
CO2: 38 mmol/L — AB (ref 21–32)
Calcium, Total: 8.1 mg/dL — ABNORMAL LOW (ref 8.5–10.1)
Chloride: 99 mmol/L (ref 98–107)
Creatinine: 0.77 mg/dL (ref 0.60–1.30)
EGFR (African American): >60
EGFR (Non-African Amer.): >60
Glucose: 115 mg/dL — ABNORMAL HIGH (ref 65–99)
Osmolality: 280 (ref 275–301)
Potassium: 3.3 mmol/L — ABNORMAL LOW (ref 3.5–5.1)
Sodium: 140 mmol/L (ref 136–145)

## 2014-02-14 LAB — MAGNESIUM: MAGNESIUM: 1.3 mg/dL — AB

## 2014-02-15 LAB — BASIC METABOLIC PANEL
Anion Gap: 2 — ABNORMAL LOW (ref 7–16)
BUN: 14 mg/dL (ref 7–18)
Calcium, Total: 8.3 mg/dL — ABNORMAL LOW (ref 8.5–10.1)
Chloride: 100 mmol/L (ref 98–107)
Co2: 38 mmol/L — ABNORMAL HIGH (ref 21–32)
Creatinine: 0.72 mg/dL (ref 0.60–1.30)
EGFR (Non-African Amer.): >60
Glucose: 153 mg/dL — ABNORMAL HIGH (ref 65–99)
OSMOLALITY: 283 (ref 275–301)
Potassium: 3.7 mmol/L (ref 3.5–5.1)
Sodium: 140 mmol/L (ref 136–145)

## 2014-02-15 LAB — MAGNESIUM: MAGNESIUM: 1.9 mg/dL

## 2014-02-22 ENCOUNTER — Inpatient Hospital Stay: Payer: Self-pay | Admitting: Internal Medicine

## 2014-02-22 LAB — LIPASE, BLOOD: LIPASE: 248 U/L (ref 73–393)

## 2014-02-22 LAB — CBC WITH DIFFERENTIAL/PLATELET
BASOS ABS: 0.1 10*3/uL (ref 0.0–0.1)
Basophil %: 0.4 %
Eosinophil #: 0.1 10*3/uL (ref 0.0–0.7)
Eosinophil %: 0.3 %
HCT: 29.1 % — ABNORMAL LOW (ref 40.0–52.0)
HGB: 9.3 g/dL — ABNORMAL LOW (ref 13.0–18.0)
LYMPHS ABS: 0.6 10*3/uL — AB (ref 1.0–3.6)
Lymphocyte %: 2.3 %
MCH: 36.4 pg — ABNORMAL HIGH (ref 26.0–34.0)
MCHC: 32.1 g/dL (ref 32.0–36.0)
MCV: 113 fL — ABNORMAL HIGH (ref 80–100)
MONOS PCT: 1.9 %
Monocyte #: 0.5 x10 3/mm (ref 0.2–1.0)
Neutrophil #: 23.9 10*3/uL — ABNORMAL HIGH (ref 1.4–6.5)
Neutrophil %: 95.1 %
Platelet: 113 10*3/uL — ABNORMAL LOW (ref 150–440)
RBC: 2.57 10*6/uL — AB (ref 4.40–5.90)
RDW: 15.5 % — AB (ref 11.5–14.5)
WBC: 25.1 10*3/uL — ABNORMAL HIGH (ref 3.8–10.6)

## 2014-02-22 LAB — COMPREHENSIVE METABOLIC PANEL
ALBUMIN: 2.6 g/dL — AB (ref 3.4–5.0)
ALT: 54 U/L (ref 12–78)
ANION GAP: 5 — AB (ref 7–16)
Alkaline Phosphatase: 196 U/L — ABNORMAL HIGH
BUN: 8 mg/dL (ref 7–18)
Bilirubin,Total: 2.3 mg/dL — ABNORMAL HIGH (ref 0.2–1.0)
CHLORIDE: 101 mmol/L (ref 98–107)
CREATININE: 0.72 mg/dL (ref 0.60–1.30)
Calcium, Total: 8.6 mg/dL (ref 8.5–10.1)
Co2: 30 mmol/L (ref 21–32)
EGFR (Non-African Amer.): >60
GLUCOSE: 111 mg/dL — AB (ref 65–99)
Osmolality: 271 (ref 275–301)
Potassium: 4.4 mmol/L (ref 3.5–5.1)
SGOT(AST): 44 U/L — ABNORMAL HIGH (ref 15–37)
Sodium: 136 mmol/L (ref 136–145)
TOTAL PROTEIN: 6.2 g/dL — AB (ref 6.4–8.2)

## 2014-02-23 LAB — CBC WITH DIFFERENTIAL/PLATELET
BASOS ABS: 0 10*3/uL (ref 0.0–0.1)
Basophil %: 0.3 %
Eosinophil #: 0 10*3/uL (ref 0.0–0.7)
Eosinophil %: 0.1 %
HCT: 23.3 % — AB (ref 40.0–52.0)
HGB: 7.6 g/dL — AB (ref 13.0–18.0)
LYMPHS ABS: 0.8 10*3/uL — AB (ref 1.0–3.6)
Lymphocyte %: 5.3 %
MCH: 36.6 pg — AB (ref 26.0–34.0)
MCHC: 32.6 g/dL (ref 32.0–36.0)
MCV: 112 fL — AB (ref 80–100)
MONO ABS: 0.6 x10 3/mm (ref 0.2–1.0)
MONOS PCT: 4 %
Neutrophil #: 12.9 10*3/uL — ABNORMAL HIGH (ref 1.4–6.5)
Neutrophil %: 90.3 %
PLATELETS: 82 10*3/uL — AB (ref 150–440)
RBC: 2.08 10*6/uL — ABNORMAL LOW (ref 4.40–5.90)
RDW: 15.5 % — ABNORMAL HIGH (ref 11.5–14.5)
WBC: 14.3 10*3/uL — ABNORMAL HIGH (ref 3.8–10.6)

## 2014-02-23 LAB — BASIC METABOLIC PANEL
Anion Gap: 5 — ABNORMAL LOW (ref 7–16)
BUN: 10 mg/dL (ref 7–18)
Calcium, Total: 7.6 mg/dL — ABNORMAL LOW (ref 8.5–10.1)
Chloride: 102 mmol/L (ref 98–107)
Co2: 29 mmol/L (ref 21–32)
Creatinine: 0.73 mg/dL (ref 0.60–1.30)
EGFR (African American): >60
EGFR (Non-African Amer.): >60
GLUCOSE: 130 mg/dL — AB (ref 65–99)
Osmolality: 273 (ref 275–301)
Potassium: 3.6 mmol/L (ref 3.5–5.1)
Sodium: 136 mmol/L (ref 136–145)

## 2014-02-23 LAB — APTT: Activated PTT: 39.9 secs — ABNORMAL HIGH (ref 23.6–35.9)

## 2014-02-23 LAB — ALBUMIN, FLUID (OTHER): BODY FLUID ALBUMIN: 1.1 g/dL

## 2014-02-23 LAB — BODY FLUID CELL COUNT WITH DIFFERENTIAL
BASOS ABS: 0 %
EOS PCT: 0 %
Lymphocytes: 9 %
NEUTROS PCT: 32 %
Nucleated Cell Count: 256 /mm3
OTHER CELLS BF: 0 %
OTHER MONONUCLEAR CELLS: 59 %

## 2014-02-23 LAB — PROTIME-INR
INR: 1.5
PROTHROMBIN TIME: 17.7 s — AB (ref 11.5–14.7)

## 2014-02-23 LAB — TSH: THYROID STIMULATING HORM: 50 u[IU]/mL — AB

## 2014-02-23 LAB — FIBRINOGEN: FIBRINOGEN: 333 mg/dL (ref 210–470)

## 2014-02-24 LAB — COMPREHENSIVE METABOLIC PANEL
ALBUMIN: 2.3 g/dL — AB (ref 3.4–5.0)
ALK PHOS: 142 U/L — AB
ANION GAP: 4 — AB (ref 7–16)
AST: 23 U/L (ref 15–37)
BUN: 9 mg/dL (ref 7–18)
Bilirubin,Total: 1.6 mg/dL — ABNORMAL HIGH (ref 0.2–1.0)
Calcium, Total: 7.6 mg/dL — ABNORMAL LOW (ref 8.5–10.1)
Chloride: 102 mmol/L (ref 98–107)
Co2: 31 mmol/L (ref 21–32)
Creatinine: 0.78 mg/dL (ref 0.60–1.30)
EGFR (Non-African Amer.): >60
Glucose: 116 mg/dL — ABNORMAL HIGH (ref 65–99)
Osmolality: 273 (ref 275–301)
Potassium: 3 mmol/L — ABNORMAL LOW (ref 3.5–5.1)
SGPT (ALT): 43 U/L (ref 12–78)
Sodium: 137 mmol/L (ref 136–145)
TOTAL PROTEIN: 5.6 g/dL — AB (ref 6.4–8.2)

## 2014-02-24 LAB — CBC WITH DIFFERENTIAL/PLATELET
BASOS ABS: 0 10*3/uL (ref 0.0–0.1)
Basophil %: 0.4 %
EOS PCT: 2.3 %
Eosinophil #: 0.2 10*3/uL (ref 0.0–0.7)
HCT: 24.7 % — ABNORMAL LOW (ref 40.0–52.0)
HGB: 8.3 g/dL — ABNORMAL LOW (ref 13.0–18.0)
LYMPHS ABS: 0.6 10*3/uL — AB (ref 1.0–3.6)
LYMPHS PCT: 5.8 %
MCH: 37.6 pg — AB (ref 26.0–34.0)
MCHC: 33.7 g/dL (ref 32.0–36.0)
MCV: 112 fL — AB (ref 80–100)
Monocyte #: 0.5 x10 3/mm (ref 0.2–1.0)
Monocyte %: 5.5 %
NEUTROS PCT: 86 %
Neutrophil #: 8.4 10*3/uL — ABNORMAL HIGH (ref 1.4–6.5)
PLATELETS: 85 10*3/uL — AB (ref 150–440)
RBC: 2.21 10*6/uL — AB (ref 4.40–5.90)
RDW: 15.7 % — AB (ref 11.5–14.5)
WBC: 9.7 10*3/uL (ref 3.8–10.6)

## 2014-02-24 LAB — GLUCOSE, SEROUS FLUID: GLUCOSE, BODY FLUID: 121 mg/dL

## 2014-02-24 LAB — APTT: ACTIVATED PTT: 41.2 s — AB (ref 23.6–35.9)

## 2014-02-24 LAB — PROTEIN, BODY FLUID: Protein, Body Fluid: 2.2 g/dL

## 2014-02-24 LAB — PROTIME-INR
INR: 1.4
Prothrombin Time: 16.5 secs — ABNORMAL HIGH (ref 11.5–14.7)

## 2014-02-24 LAB — LACTATE DEHYDROGENASE, PLEURAL OR PERITONEAL FLUID: LDH, Body Fluid: 183 U/L

## 2014-02-25 LAB — URINALYSIS, COMPLETE
Bacteria: NONE SEEN
Bilirubin,UR: NEGATIVE
GLUCOSE, UR: NEGATIVE mg/dL (ref 0–75)
Ketone: NEGATIVE
Leukocyte Esterase: NEGATIVE
Nitrite: NEGATIVE
PH: 5 (ref 4.5–8.0)
PROTEIN: NEGATIVE
RBC,UR: 3 /HPF (ref 0–5)
SPECIFIC GRAVITY: 1.015 (ref 1.003–1.030)
Squamous Epithelial: 1

## 2014-02-25 LAB — BASIC METABOLIC PANEL
Anion Gap: 7 (ref 7–16)
BUN: 5 mg/dL — ABNORMAL LOW (ref 7–18)
CALCIUM: 7.7 mg/dL — AB (ref 8.5–10.1)
CREATININE: 0.64 mg/dL (ref 0.60–1.30)
Chloride: 101 mmol/L (ref 98–107)
Co2: 30 mmol/L (ref 21–32)
EGFR (African American): >60
EGFR (Non-African Amer.): >60
GLUCOSE: 100 mg/dL — AB (ref 65–99)
OSMOLALITY: 273 (ref 275–301)
POTASSIUM: 3.2 mmol/L — AB (ref 3.5–5.1)
Sodium: 138 mmol/L (ref 136–145)

## 2014-02-26 LAB — URINALYSIS, COMPLETE
BLOOD: NEGATIVE
Bacteria: NONE SEEN
Glucose,UR: NEGATIVE mg/dL (ref 0–75)
Leukocyte Esterase: NEGATIVE
Nitrite: NEGATIVE
PH: 5 (ref 4.5–8.0)
Protein: 30
RBC,UR: 10 /HPF (ref 0–5)
Specific Gravity: 1.028 (ref 1.003–1.030)
Squamous Epithelial: 2
WBC UR: 4 /HPF (ref 0–5)

## 2014-02-26 LAB — COMPREHENSIVE METABOLIC PANEL
ALBUMIN: 2.2 g/dL — AB (ref 3.4–5.0)
ALT: 35 U/L (ref 12–78)
ANION GAP: 5 — AB (ref 7–16)
Alkaline Phosphatase: 137 U/L — ABNORMAL HIGH
BUN: 3 mg/dL — ABNORMAL LOW (ref 7–18)
Bilirubin,Total: 1.3 mg/dL — ABNORMAL HIGH (ref 0.2–1.0)
Calcium, Total: 8 mg/dL — ABNORMAL LOW (ref 8.5–10.1)
Chloride: 100 mmol/L (ref 98–107)
Co2: 32 mmol/L (ref 21–32)
Creatinine: 0.7 mg/dL (ref 0.60–1.30)
Glucose: 118 mg/dL — ABNORMAL HIGH (ref 65–99)
Osmolality: 271 (ref 275–301)
POTASSIUM: 3.4 mmol/L — AB (ref 3.5–5.1)
SGOT(AST): 20 U/L (ref 15–37)
SODIUM: 137 mmol/L (ref 136–145)
Total Protein: 5.5 g/dL — ABNORMAL LOW (ref 6.4–8.2)

## 2014-02-26 LAB — AMMONIA: Ammonia, Plasma: 61 mcmol/L — ABNORMAL HIGH (ref 11–32)

## 2014-02-27 LAB — PROTIME-INR
INR: 1.2
PROTHROMBIN TIME: 15 s — AB (ref 11.5–14.7)

## 2014-02-27 LAB — POTASSIUM: POTASSIUM: 3.1 mmol/L — AB (ref 3.5–5.1)

## 2014-02-27 LAB — BODY FLUID CULTURE

## 2014-02-27 LAB — CULTURE, BLOOD (SINGLE)

## 2014-02-27 LAB — PLATELET COUNT: Platelet: 91 10*3/uL — ABNORMAL LOW (ref 150–440)

## 2014-02-28 LAB — COMPREHENSIVE METABOLIC PANEL
ALT: 33 U/L (ref 12–78)
ANION GAP: 3 — AB (ref 7–16)
Albumin: 2.2 g/dL — ABNORMAL LOW (ref 3.4–5.0)
Alkaline Phosphatase: 141 U/L — ABNORMAL HIGH
BUN: 4 mg/dL — AB (ref 7–18)
Bilirubin,Total: 1.2 mg/dL — ABNORMAL HIGH (ref 0.2–1.0)
CALCIUM: 7.6 mg/dL — AB (ref 8.5–10.1)
CHLORIDE: 100 mmol/L (ref 98–107)
CREATININE: 0.65 mg/dL (ref 0.60–1.30)
Co2: 32 mmol/L (ref 21–32)
EGFR (Non-African Amer.): >60
Glucose: 86 mg/dL (ref 65–99)
Osmolality: 266 (ref 275–301)
Potassium: 2.8 mmol/L — ABNORMAL LOW (ref 3.5–5.1)
SGOT(AST): 27 U/L (ref 15–37)
SODIUM: 135 mmol/L — AB (ref 136–145)
TOTAL PROTEIN: 5.7 g/dL — AB (ref 6.4–8.2)

## 2014-02-28 LAB — CULTURE, BLOOD (SINGLE)

## 2014-02-28 LAB — AMMONIA: AMMONIA, PLASMA: 52 umol/L — AB (ref 11–32)

## 2014-03-15 ENCOUNTER — Ambulatory Visit: Payer: Self-pay | Admitting: Internal Medicine

## 2014-05-01 ENCOUNTER — Ambulatory Visit: Payer: Self-pay | Admitting: Gastroenterology

## 2014-05-03 LAB — PATHOLOGY REPORT

## 2014-10-26 ENCOUNTER — Inpatient Hospital Stay: Payer: Self-pay | Admitting: Internal Medicine

## 2014-10-26 LAB — COMPREHENSIVE METABOLIC PANEL
Albumin: 3.2 g/dL — ABNORMAL LOW (ref 3.4–5.0)
Alkaline Phosphatase: 193 U/L — ABNORMAL HIGH
Anion Gap: 10 (ref 7–16)
BUN: 11 mg/dL (ref 7–18)
Bilirubin,Total: 4.2 mg/dL — ABNORMAL HIGH (ref 0.2–1.0)
Calcium, Total: 6.6 mg/dL — CL (ref 8.5–10.1)
Chloride: 98 mmol/L (ref 98–107)
Co2: 29 mmol/L (ref 21–32)
Creatinine: 0.73 mg/dL (ref 0.60–1.30)
Glucose: 124 mg/dL — ABNORMAL HIGH (ref 65–99)
Osmolality: 275 (ref 275–301)
Potassium: 3.4 mmol/L — ABNORMAL LOW (ref 3.5–5.1)
SGOT(AST): 367 U/L — ABNORMAL HIGH (ref 15–37)
SGPT (ALT): 88 U/L — ABNORMAL HIGH
Sodium: 137 mmol/L (ref 136–145)
Total Protein: 6.8 g/dL (ref 6.4–8.2)

## 2014-10-26 LAB — CBC
HCT: 36.1 % — ABNORMAL LOW (ref 40.0–52.0)
HGB: 12 g/dL — ABNORMAL LOW (ref 13.0–18.0)
MCH: 36.9 pg — AB (ref 26.0–34.0)
MCHC: 33.3 g/dL (ref 32.0–36.0)
MCV: 111 fL — ABNORMAL HIGH (ref 80–100)
Platelet: 54 10*3/uL — ABNORMAL LOW (ref 150–440)
RBC: 3.25 10*6/uL — AB (ref 4.40–5.90)
RDW: 16.4 % — ABNORMAL HIGH (ref 11.5–14.5)
WBC: 6.9 10*3/uL (ref 3.8–10.6)

## 2014-10-26 LAB — PROTIME-INR
INR: 1
PROTHROMBIN TIME: 13 s (ref 11.5–14.7)

## 2014-10-27 LAB — CBC WITH DIFFERENTIAL/PLATELET
BASOS ABS: 0 10*3/uL (ref 0.0–0.1)
BASOS PCT: 0.6 %
EOS ABS: 0 10*3/uL (ref 0.0–0.7)
Eosinophil %: 1.3 %
HCT: 29.7 % — AB (ref 40.0–52.0)
HGB: 10 g/dL — ABNORMAL LOW (ref 13.0–18.0)
LYMPHS ABS: 0.8 10*3/uL — AB (ref 1.0–3.6)
Lymphocyte %: 22.2 %
MCH: 37.4 pg — ABNORMAL HIGH (ref 26.0–34.0)
MCHC: 33.8 g/dL (ref 32.0–36.0)
MCV: 111 fL — ABNORMAL HIGH (ref 80–100)
MONO ABS: 0.3 x10 3/mm (ref 0.2–1.0)
Monocyte %: 8.5 %
NEUTROS PCT: 67.4 %
Neutrophil #: 2.4 10*3/uL (ref 1.4–6.5)
PLATELETS: 35 10*3/uL — AB (ref 150–440)
RBC: 2.69 10*6/uL — AB (ref 4.40–5.90)
RDW: 15.9 % — ABNORMAL HIGH (ref 11.5–14.5)
WBC: 3.5 10*3/uL — AB (ref 3.8–10.6)

## 2014-10-27 LAB — BASIC METABOLIC PANEL
ANION GAP: 7 (ref 7–16)
BUN: 8 mg/dL (ref 7–18)
CALCIUM: 6.9 mg/dL — AB (ref 8.5–10.1)
CHLORIDE: 101 mmol/L (ref 98–107)
CO2: 30 mmol/L (ref 21–32)
CREATININE: 0.67 mg/dL (ref 0.60–1.30)
EGFR (Non-African Amer.): >60
GLUCOSE: 90 mg/dL (ref 65–99)
OSMOLALITY: 274 (ref 275–301)
Potassium: 2.9 mmol/L — ABNORMAL LOW (ref 3.5–5.1)
SODIUM: 138 mmol/L (ref 136–145)

## 2014-10-27 LAB — DRUG SCREEN, URINE

## 2014-10-27 LAB — POTASSIUM: Potassium: 3.8 mmol/L

## 2014-10-27 LAB — MAGNESIUM
Magnesium: 1.2 mg/dL — ABNORMAL LOW
Magnesium: 2.2 mg/dL

## 2015-01-12 ENCOUNTER — Emergency Department: Payer: Self-pay | Admitting: Emergency Medicine

## 2015-01-12 LAB — URINALYSIS, COMPLETE
BLOOD: NEGATIVE
GLUCOSE, UR: NEGATIVE mg/dL (ref 0–75)
Hyaline Cast: 5
Ketone: NEGATIVE
NITRITE: NEGATIVE
Ph: 5 (ref 4.5–8.0)
Protein: 30
Specific Gravity: 1.025 (ref 1.003–1.030)
WBC UR: 52 /HPF (ref 0–5)

## 2015-01-12 LAB — CBC
HCT: 37 % — ABNORMAL LOW (ref 40.0–52.0)
HGB: 12.5 g/dL — AB (ref 13.0–18.0)
MCH: 31.9 pg (ref 26.0–34.0)
MCHC: 33.7 g/dL (ref 32.0–36.0)
MCV: 95 fL (ref 80–100)
Platelet: 94 10*3/uL — ABNORMAL LOW (ref 150–440)
RBC: 3.91 10*6/uL — AB (ref 4.40–5.90)
RDW: 15.5 % — AB (ref 11.5–14.5)
WBC: 4.8 10*3/uL (ref 3.8–10.6)

## 2015-01-12 LAB — DRUG SCREEN, URINE
Amphetamines, Ur Screen: NEGATIVE (ref ?–1000)
BENZODIAZEPINE, UR SCRN: NEGATIVE (ref ?–200)
Barbiturates, Ur Screen: NEGATIVE (ref ?–200)
Cannabinoid 50 Ng, Ur ~~LOC~~: POSITIVE (ref ?–50)
Cocaine Metabolite,Ur ~~LOC~~: NEGATIVE (ref ?–300)
MDMA (Ecstasy)Ur Screen: NEGATIVE (ref ?–500)
METHADONE, UR SCREEN: NEGATIVE (ref ?–300)
OPIATE, UR SCREEN: NEGATIVE (ref ?–300)
PHENCYCLIDINE (PCP) UR S: NEGATIVE (ref ?–25)
TRICYCLIC, UR SCREEN: NEGATIVE (ref ?–1000)

## 2015-01-12 LAB — COMPREHENSIVE METABOLIC PANEL
ANION GAP: 12 (ref 7–16)
Albumin: 3.6 g/dL (ref 3.4–5.0)
Alkaline Phosphatase: 178 U/L — ABNORMAL HIGH (ref 46–116)
BILIRUBIN TOTAL: 1.8 mg/dL — AB (ref 0.2–1.0)
BUN: 5 mg/dL — AB (ref 7–18)
CHLORIDE: 102 mmol/L (ref 98–107)
Calcium, Total: 7.3 mg/dL — ABNORMAL LOW (ref 8.5–10.1)
Co2: 23 mmol/L (ref 21–32)
Creatinine: 0.67 mg/dL (ref 0.60–1.30)
EGFR (African American): >60
EGFR (Non-African Amer.): >60
GLUCOSE: 124 mg/dL — AB (ref 65–99)
Osmolality: 272 (ref 275–301)
POTASSIUM: 3.3 mmol/L — AB (ref 3.5–5.1)
SGOT(AST): 266 U/L — ABNORMAL HIGH (ref 15–37)
SGPT (ALT): 106 U/L — ABNORMAL HIGH (ref 14–63)
Sodium: 137 mmol/L (ref 136–145)
TOTAL PROTEIN: 7 g/dL (ref 6.4–8.2)

## 2015-01-12 LAB — ACETAMINOPHEN LEVEL: Acetaminophen: <2 ug/mL

## 2015-01-12 LAB — ETHANOL: Ethanol: 4 mg/dL

## 2015-01-12 LAB — SALICYLATE LEVEL: Salicylates, Serum: <1.7 mg/dL

## 2015-01-15 LAB — URINE CULTURE

## 2015-02-06 ENCOUNTER — Other Ambulatory Visit: Payer: Self-pay | Admitting: *Deleted

## 2015-02-06 ENCOUNTER — Ambulatory Visit
Admission: RE | Admit: 2015-02-06 | Discharge: 2015-02-06 | Disposition: A | Discharge status: Home / Self Care | Payer: PRIVATE HEALTH INSURANCE | Source: Ambulatory Visit | Attending: *Deleted | Admitting: *Deleted

## 2015-02-06 DIAGNOSIS — M25561 Pain in right knee: Secondary | ICD-10-CM

## 2015-04-07 NOTE — Discharge Summary (Signed)
PATIENT NAME:  Troy [name], CLONINGER MR#:  [id number] DATE OF BIRTH:  April 13, 1971  DATE OF ADMISSION:  02/06/2014 DATE OF DISCHARGE:  02/15/2014   ADMITTING DIAGNOSIS: Altered mental status.   DISCHARGE DIAGNOSES: 1. Altered mental status felt to be multifactorial, including acute encephalopathy due to hepatic encephalopathy as well as uremia, hypoxemia and mild hypercapnia.  2. Acute respiratory failure due to pulmonary edema and a possible pneumonia, status post treatment, and he is continued on oxygen. 3. Ascites, status post paracentesis.  4. Generalized deconditioning.  5. History of alcohol abuse with alcoholic hepatitis and cirrhosis.  6. Hypothyroidism.  7. Hypertension.  8. Severe malnutrition.  9. Anemia and thrombocytopenia due to alcohol abuse.  10. Hypomagnesemia.  11. Hypokalemia.  12. Insomnia.   PERTINENT LABORATORIES AND EVALUATION: BUN 25, creatinine 0.88, sodium 139, potassium 3.2, magnesium 1.6. Initial ammonia 81, last ammonia 44. Last magnesium 1.3, potassium was 3.3. Initial albumin of 1.8, bilirubin total was 3.3, alkaline phosphatase 227, AST was 92, alkaline phosphatase was 106. TSH 60.4. Initial WBC count 19.4. Abdominal fluid cytology showed negative for malignant cells. Thyroxine was low at 2.7, serum T3 was low at 1.9. ABG showed a pH of 7.39, pCO2 of 64, pO2 of 74. MRI of the brain without contrast showed no acute infarct. Scattered punctate and patchy white matter changes. Ultrasound of the abdomen showed hepatomegaly with coarse echotexture, likely representing fatty infiltrate and cirrhosis. Doppler on the 25th showed portal venous system is patent with normal direction of the flow, splenomegaly and ascites.   HOSPITAL COURSE: Please refer to H and P. The patient is a 44 year old white male who has liver cirrhosis, has ascites, who was admitted with altered mental status. The patient was noted to have an elevated ammonia. He had not been taking his lactulose at the  facility. The patient also has been on steroids for alcoholic hepatitis. He came in with increased confusion. He was felt to also have possible pneumonia due to hypoxia. The patient's mental status is improved. His respiratory status is stable. The patient, in light of his hepatic encephalopathy, is started on Xifaxan. His lactulose dose was increased. Neurology saw the patient. There was also a possibility or Wernicke's encephalopathy, but this is much unlikely since he has improved. In terms of his respiratory failure, it was felt to be due to pulmonary edema and possible pneumonia. He was treated with Zosyn, Levaquin and Zyvox. He has finished his course. He is still requiring some oxygen. He needs to have continued respiratory toileting and incentive spirometry. The patient also was noted to have ascites, and he did undergo paracentesis. It was a transudative fluid in the paracentesis. He has been treated with Lasix and spironolactone. The patient also was noted to have elevated TSH, so he was started on Synthroid, and was increased to 150 mcg per direction of endocrinology. At this time, the patient is stable for discharge.   DISCHARGE MEDICATIONS:  1. Allopurinol 300 daily.  2. Amlodipine 5 daily. 3. Protonix 40 daily.  4. Fish oil 1200 mg 1 tab p.o. t.i.d. 5. Iron sulfate 325 one tab p.o. t.i.d. 6. Levothyroxine 150 mcg daily. 7. Lasix 40 daily.  8. Lactulose 30 mL q.8. 9. Spironolactone 50 daily. 10. KCl 20 mEq daily.  11. Mag oxide 400 one tab p.o. b.i.d. 12. Rifaximin 550 one tab p.o. b.i.d. 13. Thiamine 100 daily.  14. Prednisone taper 30 mg p.o. daily for 4 days, 20 mg p.o. daily for 7 days, 10 mg p.o.  daily for 7 days, 5 mg daily for 7 days, then stop.   OXYGEN: Yes, 3 liters via nasal cannula.   DIET: Low sodium, low fat, low cholesterol, regular consistency.   ACTIVITY: As tolerated.   DISCHARGE INSTRUCTIONS: PT evaluation and treatment. Follow with primary MD in 1 to 2 weeks.  The patient needs close followup at the skilled nursing facility with the MD there. The patient also needs to continue incentive spirometry while awake. Follow up with primary MD in 5 to 7 days. Follow up with Dr. Rayann Heman of GI as needed.   TIME SPENT: Note, 40 minutes spent on the discharge.   ____________________________ Lafonda Mosses. Posey Pronto, MD shp:lb D: 02/15/2014 13:49:20 ET T: 02/15/2014 13:57:37 ET JOB#: [id number]  cc: Yehoshua Vitelli H. Posey Pronto, MD, <Dictator> Alric Seton MD ELECTRONICALLY SIGNED 02/21/2014 15:25

## 2015-04-07 NOTE — Consult Note (Signed)
Referring Physician:  Demetrios Loll :   Primary Care Physician:  Demetrios Loll : [facility], [address], [place], [place] [address], [place] [phone]  Reason for Consult: Admit Date: 25-Oct-2014  Chief Complaint: seizure  Reason for Consult: seizure   History of Present Illness: History of Present Illness:   44 yo RHD M presents to [facility] secondary to multiple witnessed seizures.  Pt is now back at baseline.  Pt reports that he had a drink one day ago.  Pt also reports seizures 2 years ago that were probably related to EtOH as well.  Pt has had multiple falls in the past as well and currently deals with cirrhosis.  ROS:  General fatigue    HEENT no complaints    Lungs no complaints    Cardiac no complaints    GI no complaints    GU no complaints    Musculoskeletal no complaints    Extremities swelling    Skin no complaints    Neuro no complaints    Endocrine no complaints    Past Medical/Surgical Hx:  Hepatitis:   hypothyroid:   Alcohol Abuse:   high cholesterol:   gout:   hypertension:   testicular torsion:   perirectal abscess:   left THR:   Past Medical/ Surgical Hx:  Past Medical History reviewed by me as above   Past Surgical History reviewed by me as above   Home Medications: Medication Instructions Last Modified Date/Time  rifaximin 550 mg oral tablet 1 tab(s) orally 2 times a day 12-Nov-15 16:22  allopurinol 300 mg oral tablet 1 tab(s) orally once a day 12-Nov-15 16:22  amlodipine 5 mg oral tablet 1 tab(s) orally once a day 12-Nov-15 16:22  pantoprazole 40 mg oral delayed release tablet 1 tab(s) orally once a day 12-Nov-15 16:22  Fish Oil 1200 mg oral capsule 1 cap(s) orally 3 times a day 12-Nov-15 16:22  ferrous sulfate 325 mg oral tablet 1 tab(s) orally 3 times a day 12-Nov-15 16:22  spironolactone 50 mg oral tablet 1 tab(s) orally once a day 12-Nov-15 16:22  traMADol 50 mg oral tablet 2 tab(s) orally every 6 hours, As Needed - for Pain  12-Nov-15 16:22  nadolol 20 mg oral tablet 1 tab(s) orally once a day 12-Nov-15 16:22  potassium chloride 20 mEq oral tablet, extended release 1 tab(s) orally 3 times a day 12-Nov-15 16:22  magnesium oxide 400 mg (241.3 mg elemental magnesium) oral tablet 1 tab(s) orally 2 times a day 12-Nov-15 16:22  levothyroxine 100 mcg (0.1 mg) oral tablet 1 tab(s) orally once a day 12-Nov-15 16:22  furosemide 40 mg oral tablet 1 tab(s) orally once a day 12-Nov-15 16:22  gabapentin 100 mg oral capsule 3 cap(s) orally once a day (at bedtime), As Needed for nerves 12-Nov-15 16:22   Allergies:  No Known Allergies:   Allergies:  Allergies NKDA    Social/Family History: Employment Status: currently employed  Lives With: alone  Living Arrangements: apartment  Social History: + tob, + EtOH, no illicits  Family History: no seizures, no stroke   Vital Signs:  **Vital Signs.:   12-Nov-15 17:39  Temperature Temperature (F) 98.5  Celsius 36.9  Temperature Source oral  Pulse Pulse 99  Respirations Respirations 13  Systolic BP Systolic BP 818  Diastolic BP (mmHg) Diastolic BP (mmHg) 70  Mean BP 86  Pulse Ox % Pulse Ox % 100  Oxygen Delivery Room Air/ 21 %    18:00  Vital Signs Type Routine  Pulse  Pulse 102  Respirations Respirations 21  Systolic BP Systolic BP 505  Diastolic BP (mmHg) Diastolic BP (mmHg) 74  Mean BP 87  Pulse Ox % Pulse Ox % 100  Oxygen Delivery Room Air/ 21 %   Physical Exam: General: overweight, NAD  HEENT: normocephalic, sclera icteric, oropharynx clear  Neck: supple, no JVD, no bruits  Chest: CTA B, no wheezing  Cardiac: RRR, no murmurs, + edema, 2+ pulses  Extremities: no C/C, FROM   Neurologic Exam: Mental Status: alert and oriented x 3, normal speech and language, follows complex commands  Cranial Nerves: PERRLA, EOMI, nl VF, face symmetric, tongue midline, shoulder shrug equal  Motor Exam: 4/5 B, nl tone  Deep Tendon Reflexes: 0/5 B, mute plantars  Sensory  Exam: decreased pinprick, temperature, and vibration intact B in stocking and glove pattern  Coordination: FTN and HTS WNL, nl RAM   Lab Results:  Hepatic:  12-Nov-15 15:18   Bilirubin, Total  4.2  Alkaline Phosphatase  193 (46-116 NOTE: New Reference Range 07/04/14)  SGPT (ALT)  88 (14-63 NOTE: New Reference Range 07/04/14)  SGOT (AST)  367  Total Protein, Serum 6.8  Albumin, Serum  3.2  Routine Chem:  12-Nov-15 15:18   Glucose, Serum  124  BUN 11  Creatinine (comp) 0.73  Sodium, Serum 137  Potassium, Serum  3.4  Chloride, Serum 98  CO2, Serum 29  Calcium (Total), Serum  6.6  Osmolality (calc) 275  eGFR (African American) >60  eGFR (Non-African American) >60 (eGFR values <30m/min/1.73 m2 may be an indication of chronic kidney disease (CKD). Calculated eGFR, using the MRDR Study equation, is useful in  patients with stable renal function. The eGFR calculation will not be reliable in acutely ill patients when serum creatinine is changing rapidly. It is not useful in patients on dialysis. The eGFR calculation may not be applicable to patients at the low and high extremes of body sizes, pregnant women, and vegetarians.)  Result Comment CALCIUM - RESULTS VERIFIED BY REPEAT TESTING.  - NOTIFIED OF CRITICAL VALUE  - CALLED TO GARY FLICK,RN <<[id number]>_7<\/[id number]>_2  - 10/26/14  - READ-BACK PROCESS PERFORMED.  - ..SRB  Result(s) reported on 26 Oct 2014 at 03:57PM.  Anion Gap 10  Routine Coag:  12-Nov-15 15:18   Prothrombin 13.0  INR 1.0 (INR reference interval applies to patients on anticoagulant therapy. A single INR therapeutic range for coumarins is not optimal for all indications; however, the suggested range for most indications is 2.0 - 3.0. Exceptions to the INR Reference Range may include: Prosthetic heart valves, acute myocardial infarction, prevention of myocardial infarction, and combinations of aspirin and anticoagulant. The need for a higher or lower target INR must be  assessed individually. Reference: The Pharmacology and Management of the Vitamin K  antagonists: the seventh ACCP Conference on Antithrombotic and Thrombolytic Therapy. CRFXJO.8325Sept:126 (3suppl): [id number] A HCT value >55% may artifactually increase the PT.  In one study,  the increase was an average of 25%. Reference:  "Effect on Routine and Special Coagulation Testing Values of Citrate Anticoagulant Adjustment in Patients with High HCT Values." American Journal of Clinical Pathology 2006;126:400-405.)  Routine Hem:  12-Nov-15 15:18   WBC (CBC) 6.9  RBC (CBC)  3.25  Hemoglobin (CBC)  12.0  Hematocrit (CBC)  36.1  Platelet Count (CBC)  54 (Result(s) reported on 26 Oct 2014 at 03:45PM.)  MCV  111  MCH  36.9  MCHC 33.3  RDW  16.4   Radiology Results: CT:  12-Nov-15 15:42, CT Head Without Contrast  CT Head Without Contrast   REASON FOR EXAM:    headache, seizures  COMMENTS:       PROCEDURE: CT  - CT HEAD WITHOUT CONTRAST  - Oct 26 2014  3:42PM     CLINICAL DATA:  Two seizures earlier in the day ; headaches    EXAM:  CT HEAD WITHOUT CONTRAST    TECHNIQUE:  Contiguous axial images were obtained from the base of the skull  through the vertex without intravenous contrast.    COMPARISON:  Head CT February 06, 2014 and brain MRI February 07, 2014    FINDINGS:  There is mild diffuse atrophy for age. There is no intracranial mass  hemorrhage, extra-axial fluid collection, or midline shift. 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 Slight small vessel disease adjacent to the  frontal horns of each lateral ventricle is stable. Elsewhere  gray-white compartments appear normal. No acute infarct apparent.  The bony calvarium appears intact. The mastoid air cells are clear.     IMPRESSION:  Mild atrophy for age. Prior small infarct left anterior centrum  semiovale. Slightsmall vessel disease adjacent to the frontal  horns  of each lateral ventricle. No intracranial mass, hemorrhage, or  acute appearing infarct.      Electronically Signed    By: Lowella Grip M.D.    On: 10/26/2014 16:10         Verified By: Leafy Kindle. 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 no EEG no MRI start Lyrica 42m BID x 1 week, then 1547mBID place on EtOH withdrawal protocol no driving or operating heavy machinery x 6 months will sign off, [facility]  Electronic Signatures: SmJamison NeighborMD)  (Signed [phone])  Authored: REFERRING PHYSICIAN, Primary Care Physician, Consult, History of Present Illness, Review of Systems, PAST MEDICAL/SURGICAL HISTORY, HOME MEDICATIONS, ALLERGIES, Social/Family History, NURSING VITAL SIGNS, Physical Exam-, LAB RESULTS, RADIOLOGY RESULTS, Recommendations   Last Updated: 12-Nov-15 22:50 by SmJamison NeighborMD)

## 2015-04-07 NOTE — Consult Note (Signed)
PATIENT NAME:  Troy [name], PENNING MR#:  [id number] DATE OF BIRTH:  1971-08-11  DATE OF CONSULTATION:  02/07/2014  REFERRING PHYSICIAN:  Bettey Costa, MD CONSULTING PHYSICIAN:  Arther Dames, MD  REASON FOR CONSULTATION: Altered mental status, hepatic encephalopathy.   HISTORY OF PRESENT ILLNESS: Mr. Bara is a 44 year old male with a history of alcohol cirrhosis who is presenting to the hospital for altered mental status. Of note, Mr. Helle was admitted to the hospital in January for altered mental status and was found to have acute alcohol hepatitis. He was started on prednisone and also was treated with detox. His altered mental status did gradually get better during the course of that hospitalization on large amounts of lactulose. This was eventually titrated to 2 bowel movements per day.   Mr. Wingerter is unable to give a history as he is very altered at this time. This history is per his wife. Per his wife, he overall was doing well at the rehab facility until several days ago. At the rehab he started becoming confused which was progressive. He was thus brought to the Emergency Room for further evaluation. She is not aware of him having any specific complaints such as abdominal pain or headache at the rehab facility. She is adamant that he did not have any access to alcohol at the rehab facility.   PAST MEDICAL HISTORY: 1.  Alcohol cirrhosis.  2.  Hypertension.  3.  Gout.  4.  Hypothyroid.   HOME MEDICATIONS:  1.  Allopurinol 300 daily.  2.  Norvasc 5 mg daily.  3.  Iron 325 t.i.d.  4.  Fish oil 1200 t.i.d.  5.  Lasix 40 mg daily.  6.  Lactulose 50 mg b.i.d.  7.  Synthroid 75 mcg daily.  8.  Gemfibrozil 40 mcg daily.  9.  Prednisone 13.3 mL daily currently.   PAST SURGICAL HISTORY: Left hip replacement.   ALLERGIES: NKDA.  SOCIAL HISTORY: His wife denies that he has been drinking any alcohol since discharge back in January and no history of tobacco.   FAMILY HISTORY: The wife is unaware of  any family history of GI malignancy.   REVIEW OF SYSTEMS: Unable to obtain a review as the patient is very altered at this time.   PHYSICAL EXAMINATION: VITAL SIGNS: Temperature is 97.3, pulse 95, respirations 20, blood pressure 127/74, and pulse oximetry 93% on 4 liters. GENERAL: Alert and oriented x 0.  No acute distress. Appears stated age. HEENT: Normocephalic/atraumatic. Extraocular movements are intact. Anicteric. NECK: Soft, supple. JVP appears normal. No adenopathy. CHEST: Clear to auscultation. No wheeze or crackle. Slightly labored breathing. HEART: Regular. No murmur, rub, or gallop.  Normal S1 and S2. ABDOMEN: Soft.  Normal active bowel sounds in all 4 quadrants.  No organomegaly. No masses. Positive for obesity, some distention, positive for diffuse tenderness to palpation. EXTREMITIES: 2+ swelling bilateral, well perfused. SKIN: No rash or lesion. Skin color, texture, turgor normal. NEUROLOGICAL: Unable to assess. MUSCULOSKELETAL: No joint swelling or erythema.   LABORATORY DATA: Currently his BUN is 25, creatinine 0.88, sodium 139, potassium 3.2, chloride 101, bicarb 32, and calcium 7.9. Mag is 1.6. Ammonia is 81. Liver enzymes: Albumin 1.8, total bili 3.3, alk phos 227, AST 92, ALT 106. His white count is 19, hemoglobin 8.2, hematocrit 25, and platelets are 63. His INR is 1.2.   ASSESSMENT AND PLAN:  1.  Hepatic encephalopathy: It seems as though he was not moving his bowels enough on the lactulose that he was  getting at the rehab and did develop hepatic encephalopathy. This lactulose will need to be titrated to 2 to 3 bouts per day, otherwise, he will likely develop hepatic encephalopathy again. We are also looking for other causes of the encephalopathy such as urinary tract infection or central nervous system injury. He did have mild changes on his CT, but I suspect this is not at all the cause of altered mental status. Plan: I would like to go ahead and obtain an ultrasound of  his abdomen to look for ascites, which may have built up in the interim. If he does have ascites, we would need to tap it to evaluate for spontaneous bacterial peritonitis. I will do an ultrasound of his liver and gallbladder to make sure he does not have any cause such as cholecystitis causing the altered mental status. In addition, I will increase his lactulose to 30 mL q. 4 hours until he starts moving his bowels frequently. In addition, I will start him on rifaximin 500 mg b.i.d. for treatment of the hepatic encephalopathy. He should continue on his current prednisone taper for treatment of the previous alcohol hepatitis.  2.  Cirrhosis: Given how severely altered he is, we will not be making any additional changes such as upper endoscopy at this time. We will further manage his cirrhosis as an outpatient. Currently, the priority is treating the hepatic encephalopathy and the complications that come along with it.  Thank you for this consult.   ____________________________ Arther Dames, MD mr:sb D: 02/07/2014 15:52:29 ET T: 02/07/2014 16:27:31 ET JOB#: [id number]  cc: Arther Dames, MD, <Dictator> Mellody Life MD ELECTRONICALLY SIGNED 02/14/2014 13:51

## 2015-04-07 NOTE — Consult Note (Signed)
Chief Complaint:  Subjective/Chief Complaint Covering for Dr. Rayann Heman. Lethargic but able to converse. Ammonia level down. On lactulose q6hrs, though patient apparently only had 2 BM's yest? No other complaints. LFT remains high. WBC going up but may be due to prednisone.   VITAL SIGNS/ANCILLARY NOTES: **Vital Signs.:   24-Jan-15 05:30  Vital Signs Type Routine  Temperature Temperature (F) 98.5  Celsius 36.9  Temperature Source oral  Pulse Pulse 110  Respirations Respirations 20  Systolic BP Systolic BP 978  Diastolic BP (mmHg) Diastolic BP (mmHg) 51  Mean BP 72  Pulse Ox % Pulse Ox % 92  Pulse Ox Activity Level  At rest  Oxygen Delivery Room Air/ 21 %   Brief Assessment:  GEN no acute distress   Cardiac Regular   Respiratory clear BS   Gastrointestinal Normal  distended   Lab Results: Routine Chem:  24-Jan-15 05:29   Ammonia, Plasma < 10 (Result(s) reported on 07 Jan 2014 at [facility].)  Glucose, Serum  120  BUN  39  Creatinine (comp) 1.19  Sodium, Serum  126  Potassium, Serum 4.4  Chloride, Serum  94  CO2, Serum 22  Calcium (Total), Serum 9.0  Anion Gap 10  Osmolality (calc) 264  eGFR (African American) >60  eGFR (Non-African American) >60 (eGFR values <63m/min/1.73 m2 may be an indication of chronic kidney disease (CKD). Calculated eGFR is useful in patients with stable renal function. The eGFR calculation will not be reliable in acutely ill patients when serum creatinine is changing rapidly. It is not useful in  patients on dialysis. The eGFR calculation may not be applicable to patients at the low and high extremes of body sizes, pregnant women, and vegetarians.)  Result Comment NRBCS - SLIDE PREVIOUSLY REVIEWED BY PATHOLOGIST WBC - CORRECTED FOR NRBCS PRESENT  Result(s) reported on 07 Jan 2014 at 06:31AM.  Routine Hem:  24-Jan-15 05:29   WBC (CBC)  18.6  RBC (CBC)  1.96  Hemoglobin (CBC)  8.0  Hematocrit (CBC)  24.1  Platelet Count (CBC)  100  MCV  123   MCH  40.9  MCHC 33.2  RDW  16.5  Bands 3  Segmented Neutrophils 76  Lymphocytes 11  Monocytes 10  NRBC 11  Diff Comment 1 ANISOCYTOSIS  Diff Comment 2 POLYCHROMASIA  Diff Comment 3 PLTS VARIED IN SIZE  Diff Comment 4 TARGET CELLS  Result(s) reported on 07 Jan 2014 at 06:31AM.   Assessment/Plan:  Assessment/Plan:  Assessment Alcoholic hepatitis. On steroids. Encephalopathy. On lactulose.   Plan Continue lactulose q 6hrs. Continue to watch for withdrawal. Continue steroids.   Electronic Signatures: OVerdie Shire(MD)  (Signed 24-Jan-15 10:18)  Authored: Chief Complaint, VITAL SIGNS/ANCILLARY NOTES, Brief Assessment, Lab Results, Assessment/Plan   Last Updated: 24-Jan-15 10:18 by OVerdie Shire(MD)

## 2015-04-07 NOTE — Discharge Summary (Signed)
PATIENT NAME:  Troy [name], Troy [name] MR#:  [id number] DATE OF BIRTH:  [birth year]  DATE OF ADMISSION:  02/22/2014 DATE OF DISCHARGE:  02/28/2014  DISCHARGE MEDICATIONS:  1.  Allopurinol 300 mg once a day.  2.  Amlodipine 5 mg once a day.  3.  Protonix 40 mg once a day.  4.  Fish oil 120 mg three times daily.  5.  Ferrous sulfate 325 mg once a day.  6.  Rifaximin 550 mg twice daily.  7.  Thiamine 100 mg once a day.  8.  Levothyroxine 75 mcg daily.  9.  Spironolactone 50 mg daily.  10.  Furosemide 40 mg twice daily.  11.  Lactulose 30 mL every six hours. Hold for more than 2 bowel movements. If less than 3 bowel movements, okay to decrease the dose to 5 times a day.  12.  Potassium chloride 20 mEq three times a day.  13.  Magnesium oxide 400 mg twice daily.  14. tramadol 50 mg two tablets every 6 hours as needed for pain.  15.  Nadolol 20 mg once a day.  DIET: Water restriction 1700 mL in 24 hours.   FOLLOWUP: With Dr. Rayann Heman and PCP.   REASON FOR ADMISSION: Abdominal pain and abdominal distention.   DISCHARGE DIAGNOSES:  1.  Abdominal pain secondary to distention, secondary to ascites and ileus, now resolved.  2.  Leukocytosis. 3.  Hepatic encephalopathy.  4.  Hypothyroidism.  5.  Morbid obesity.  6.  Generalized deconditioning and weakness.  7.  Cirrhosis with ascites and portal hypertension.  8.  Hypokalemia.   IMPORTANT DIAGNOSTIC TESTS: Labs at discharge: Potassium was 2.8, replace with 40 mEq p.o. and 20 IV. Sodium 135, glucose 86, ammonia 52, alkaline phosphatase 141, bilirubin 1.2, platelet count 91. Last hemoglobin 8.3. TSH is 50 for what dose of levofloxacin was increased from 75 to 100. Follow up TSH in the next 4 to 6 weeks. No growth on cultures. Acid-fast negative. INR 1.41. TSH more than 50.   HOSPITAL COURSE: This is a nice 44 year old gentleman with history of liver cirrhosis who  comes complaining of abdominal pain and abdominal distention, 02/22/2014. Had hospital previous  diagnosis of hepatic encephalopathy and acute respiratory failure with acidosis. The patient had a slight fever on admission of 99.6. His heart rate was 130, respirations 22, meeting criteria for systemic inflammatory response syndrome.   The patient was admitted with abdominal pain that was due to an ileus with worsening ascites. At the beginning, it was thought that the patient could have spontaneous bacterial peritonitis, but after paracentesis, the fluid showed that his white blood cells were in the 250s with only low % polymorphonuclears.   Cultures were taken and no growth was seen.   He also had a CT scan of the abdomen and pelvis with contrast showing areas of dilated small bowel and some areas of nondilated small bowel, although  somewhat distended distal ileum. The colon was distended. No mechanical obstruction. Diagnosed as an ileus.   The patient underwent a paracenteses with removal of about 2 L of fluid, but other than that, the patient did not have any fast relief. He still retained some fluid. I discussed this with Dr. Rayann Heman, who was trying to do more diuresis rather than paracentesis, for what we had scheduled a paracentesis treatment and canceled. his tx is base on maximize lasix  to 40 mg twice daily. Aldactone was added again.   As far as his hepatic encephalopathy, it was  stable. He had a slight increase of his ammonia of the 60s and he was getting a little bit lethargic, for what orders for more lactulose were given.   As far as his general deconditioning, the patient needs to go back to PT.   The patient has systemic inflammatory response syndrome with leukocytosis and low-grade fever. No signs of infection of the urine, fluid, from the ascites, or lungs, for which the patient was first covered with antibiotics, Zosyn, then changed to ceftriaxone . Now it is stopped as the patient did not have SBP. .   The patient did well during this hospitalization.   TOTAL TIME SPENT: I  spent about 45 minutes with this discharge.   ____________________________ [place] Sink, MD rsg:np D: 02/28/2014 13:48:53 ET T: 02/28/2014 16:17:02 ET JOB#: [id number]  cc:  Sink, MD, <Dictator> Aniyha Tate America Brown MD ELECTRONICALLY SIGNED 03/12/2014 13:35

## 2015-04-07 NOTE — Consult Note (Signed)
Details:   - GI follow up:  S: still confused.  No stool in 2 days.   No bleeding, no abd pain, n/v  O:  vss a and o x 2 + asterixis cta, no w/c rrr, no m/r/g obese, nabs, soft no swell, well perf  Labs: LFT unchanged. T. bili 10.   - A/P:  ETOH Hepatitis with hepatic encephalopathy.  - increase lactulose to 30 ml q4 hours.  Titrate to 2 - 3 stools per day. - cont prednisolone 40 mg daily - consider head CT for continued AMS although + asterixis makes this mostly likekly hepatic encephalopathy - substance abuse counseling.  - will cont to follow.   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  Routine Chem:  24-Jan-15 05:29   Glucose, Serum  120  BUN  39  Creatinine (comp) 1.19  Sodium, Serum  126  Potassium, Serum 4.4  Chloride, Serum  94  CO2, Serum 22  Calcium (Total), Serum 9.0  Osmolality (calc) 264  eGFR (African American) >60  eGFR (Non-African American) >60 (eGFR values <75m/min/1.73 m2 may be an indication of chronic kidney disease (CKD). Calculated eGFR is useful in patients with stable renal function. The eGFR calculation will not be reliable in acutely ill patients when serum creatinine is changing rapidly. It is not useful in  patients on dialysis. The eGFR calculation may not be applicable to patients at the low and high extremes of body sizes, pregnant women,  and vegetarians.)  Anion Gap 10  Ammonia, Plasma < 10 (Result(s) reported on 07 Jan 2014 at 06:26AM.)  Result Comment NRBCS - SLIDE PREVIOUSLY REVIEWED BY PATHOLOGIST WBC - CORRECTED FOR NRBCS PRESENT  Result(s) reported on 07 Jan 2014 at 06:31AM.  Routine UA:  24-Jan-15 11:04   Color (UA) Amber  Clarity (UA) Hazy  Glucose (UA) 50 mg/dL  Bilirubin (UA) 2+  Ketones (UA) Trace  Specific Gravity (UA) 1.036  Blood (UA) 3+  pH (UA) 5.0  Protein (UA) Negative  Nitrite (UA) Negative  Leukocyte Esterase (UA) Trace (Result(s) reported on 07 Jan 2014 at 11:59AM.)  RBC (UA) 78 /HPF  WBC (UA) 16 /HPF  Bacteria (UA) TRACE  Epithelial Cells (UA) 1 /HPF (Result(s) reported on 07 Jan 2014 at 11:59AM.)  Routine Hem:  24-Jan-15 05:29   WBC (CBC)  18.6  RBC (CBC)  1.96  Hemoglobin (CBC)  8.0  Hematocrit (CBC)  24.1  Platelet Count (CBC)  100  MCV  123  MCH  40.9  MCHC 33.2  RDW  16.5  Bands 3  Segmented Neutrophils 76  Lymphocytes 11  Monocytes 10  NRBC 11  Diff Comment 1 ANISOCYTOSIS  Diff Comment 2 POLYCHROMASIA  Diff Comment 3 PLTS VARIED IN SIZE  Diff Comment 4 TARGET CELLS  Result(s) reported on 07 Jan 2014 at 06:31AM.   Electronic Signatures: Arther Dames (MD)  (Signed 27-Jan-15 08:14)  Authored: Details, Lab Results   Last Updated: 27-Jan-15 08:14 by Arther Dames (MD)

## 2015-04-07 NOTE — Consult Note (Signed)
PATIENT NAME:  Troy [name], Troy [name] MR#:  [id number] DATE OF BIRTH:  11/27/1971  DATE OF CONSULTATION:  02/09/2014  CONSULTING PHYSICIAN:  Gonzella Lex, MD  IDENTIFYING INFORMATION AND REASON FOR CONSULTATION:  A 44 year old gentleman with cirrhosis and alcohol dependence. Consult for substance abuse rehab.   HISTORY OF PRESENT ILLNESS: Information obtained mostly from the chart and from the wife. The patient provided a little bit of information. The wife reports that the patient has been drinking for years. It has escalated over the last few years. She does even know how much he drinks, but takes his word for it, that it was about a liter and a half to 2 liters of wine per day on average. He has been getting increasingly sick from it. 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 She does not think he abuses any other drugs. He has not been involved in any kind of substance abuse treatment. The patient is also described as having been what the wife would call depressed recently. No report, however, of any suicidal ideation or psychotic symptoms. He has not been receiving any psychiatric care.   PAST PSYCHIATRIC HISTORY: No previous psychiatric treatment and, most notably, no history of any kind of treatment, whatsoever, for alcohol abuse. He has never been involved with AA, never been to a detox program, never been to a rehab program, has apparently never really made a very serious attempt to stop.   FAMILY HISTORY: Multiple family members going back 2 generations with alcohol dependence.   SOCIAL HISTORY: The patient is married. He and his wife have been together for about 10 years. It is just the 2 of them at home. 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   PAST MEDICAL HISTORY: The patient currently has hepatic encephalopathy related to cirrhosis with continued elevated ammonia levels. Also, has  respiratory failure from pulmonary edema, probably a pneumonia. Multiple other symptoms of malnutrition and cirrhosis.   REVIEW OF SYSTEMS: The patient reports that he is having some pain. His throat has some discomfort which causes his voice to be hoarse. He has some pain in his abdominal region. He does not feel that his mood is depressed. Denies any suicidal ideation. Denies any hallucinations or psychotic symptoms. Otherwise, rest of the relevant review of systems and full 10-point review of systems is negative.   MENTAL STATUS EXAM: The patient was asleep when I came in and slept through most of the conversation I had with his wife. With a little effort, I was able to awaken him. He made some attempt to be cooperative, but eye contact was still only intermittent. Psychomotor activity very sluggish. When he aroused himself, his speech was gruff, but clearly understandable, but decreased in total amount. Affect constricted. Mood stated as good. Thoughts appear to be somewhat slow, with little elaboration. A couple of times he seemed to get confused and made nonsensical answers, but for the most part stayed on task. He did not appear to be psychotic. Did not report any hallucinations. Totally denied suicidal or homicidal ideation. The patient's memory was unable to remember three words at three minutes. Longer-term memory seems shaky on a lot of details. He was alert and oriented to his situation, not to the exact date. He knew his wife. Basic fund of knowledge intact. Judgment and insight recently somewhat impaired.  ASSESSMENT: This is a 44 year old gentleman who has been drinking heavily for years. He has had cirrhosis, which is now advanced enough to cause multiple symptoms, including ascites and hepatic encephalopathy. His hepatic encephalopathy seems to wax and wane, as it normally does day to day. Apparently, I caught him at a relatively lucid time. He currently is deconditioned and weak, not able to  get up physically, not able to participate in anything at all ambulatory and mentally too sluggish to participate in any therapy. There is no question that he needs to stop drinking as a matter of life and death.   TREATMENT PLAN: I have discussed all of this with the patient. He made some effort to discuss some details about relapse prevention, but he is barely in any mental shape to have these kind of conversations. As might be expected, in these kind of cases, he seems a little over- confident in his ability to simply stop drinking. In any case, right now, he is not capable of engaging in any kind of substance abuse treatment in or outpatient. I would anticipate that he will still need to be discharged back to rehab and probably has a ways to go with rehabilitation, before he is even going to be able to consider going back home. If he were to improve his mental and physical symptoms to the point where he might be able to actually move himself around and participated in lucid conversation for extended periods of time, then he would be a good candidate for substance abuse rehab. He has private insurance through his wife. I will try to get some information to them regarding a facility such as [facility] or [facility]. The wife and patient are aware that he will not be able to go to treatment directly from his hospitalization here likely.   DIAGNOSIS; PRINCIPAL AND PRIMARY:    AXIS I:  1.  Delirium/encephalopathy, related to results of cirrhosis.  2.  Alcohol dependence.  AXIS II: No diagnosis.  AXIS III: Cirrhosis; multiple complications thereof.  AXIS IV: Severe, being out of work, severe illness.  AXIS V: Functioning at time of evaluation; 25.   ____________________________ Gonzella Lex, MD jtc:NTS D: 02/09/2014 22:28:44 ET T: 02/10/2014 03:45:45 ET JOB#: [id number]  cc: Gonzella Lex, MD, <Dictator> Gonzella Lex MD ELECTRONICALLY SIGNED 02/10/2014 9:39

## 2015-04-07 NOTE — Consult Note (Signed)
Details:   - GI follow up:  S: mental status continues to mirpove.  Moving stools frequently.  Denies abd pain, n/v,  O: a and o x 3 cta, no w/c rrr, no m/r/g obese, nabs, soft no swell, well perf  Labs: wbc still at 30 INR 1.3.  LIver enzymes unchanged  A/P: - cont prednisolone 40 mg daily - may need to decrease lactulose to once daily - monitor creat closely.  - will cont to follow.   Electronic Signatures: Arther Dames (MD)  (Signed 30-Jan-15 21:30)  Authored: Details   Last Updated: 30-Jan-15 21:30 by Arther Dames (MD)

## 2015-04-07 NOTE — Consult Note (Signed)
Brief Consult Note: Diagnosis: Acute hepatitis, AMS.   Patient was seen by consultant.   Consult note dictated.   Orders entered.   Discussed with Attending MD.   Comments: Patient seen and examined. hx alcohol use and abuse with last drank tuesday PM.  Labs and [place] reviewed, calculated Maddrey's score for alcoholic hepatitis is 36. MELD 19. Patients mental status significant altered, he is very confused. Per wife, has been saying "weird things" and acting strange for about 5-6 days now. Denies constipation. c/o constant hiccups.  Plan: Will start IV Prednisolone 40mg  QD.  Ordered ammonia level for hepatic encephalopathy and will start Lactulose. Recommend titrating therapy to 2-3 BMs in a 24hr period Ordered diagnostic paracentesis and will send fluid for analysis. Correct eletrolytes. CTM LFTs, CBC. Watch for alcohol withrawl and follow protocol. Encouraged alcohol cessation. note: patients last EGD/colonoscopy was 2012, negative for esophageal varices/portal HTN. patient has hx of Barretts esophagus and colon polyps, due for repeat surveillence 04/2014.   full consult dictated. will follow.  Electronic Signatures: Sherald Barge (PA-C)  (Signed 22-Jan-15 15:32)  Authored: Brief Consult Note   Last Updated: 22-Jan-15 15:32 by Sherald Barge (PA-C)

## 2015-04-07 NOTE — Consult Note (Signed)
Chief Complaint:  Subjective/Chief Complaint Still lethargic. Sl hand tremors. 2 BM's yest?   VITAL SIGNS/ANCILLARY NOTES: **Vital Signs.:   25-Jan-15 07:57  Telemetry pattern Cardiac Rhythm Tachycardia   Brief Assessment:  GEN no acute distress   Cardiac Regular   Respiratory normal resp effort   Gastrointestinal Normal   Lab Results: Routine Chem:  24-Jan-15 05:29   Ammonia, Plasma < 10 (Result(s) reported on 07 Jan 2014 at [facility].)  Glucose, Serum  120  BUN  39  Creatinine (comp) 1.19  Sodium, Serum  126  Potassium, Serum 4.4  Chloride, Serum  94  CO2, Serum 22  Calcium (Total), Serum 9.0  Anion Gap 10  Osmolality (calc) 264  eGFR (African American) >60  eGFR (Non-African American) >60 (eGFR values <64m/min/1.73 m2 may be an indication of chronic kidney disease (CKD). Calculated eGFR is useful in patients with stable renal function. The eGFR calculation will not be reliable in acutely ill patients when serum creatinine is changing rapidly. It is not useful in  patients on dialysis. The eGFR calculation may not be applicable to patients at the low and high extremes of body sizes, pregnant women, and vegetarians.)  Result Comment NRBCS - SLIDE PREVIOUSLY REVIEWED BY PATHOLOGIST WBC - CORRECTED FOR NRBCS PRESENT  Result(s) reported on 07 Jan 2014 at 06:31AM.  Routine Hem:  24-Jan-15 05:29   WBC (CBC)  18.6  RBC (CBC)  1.96  Hemoglobin (CBC)  8.0  Hematocrit (CBC)  24.1  Platelet Count (CBC)  100  MCV  123  MCH  40.9  MCHC 33.2  RDW  16.5  Bands 3  Segmented Neutrophils 76  Lymphocytes 11  Monocytes 10  NRBC 11  Diff Comment 1 ANISOCYTOSIS  Diff Comment 2 POLYCHROMASIA  Diff Comment 3 PLTS VARIED IN SIZE  Diff Comment 4 TARGET CELLS  Result(s) reported on 07 Jan 2014 at 06:31AM.   Assessment/Plan:  Assessment/Plan:  Assessment Alcoholic hepatitis. Hepatic encephalopathy.   Plan Continue prednisone. May need to increase lactulose to q 4 hrs so  patient can have min 3-4 BM's daily. Dr. RRayann Hemanwill see patient tomorrow. Thanks   Electronic Signatures: OVerdie Shire(MD)  (Signed 25-Jan-15 09:30)  Authored: Chief Complaint, VITAL SIGNS/ANCILLARY NOTES, Brief Assessment, Lab Results, Assessment/Plan   Last Updated: 25-Jan-15 09:30 by OVerdie Shire(MD)

## 2015-04-07 NOTE — Consult Note (Signed)
Details:   - GI follow up:  S: still confused today. One or 2 stools. NO abd pain, no f/c.   O: vss a and o x 2, nad + asterixis cta, no w/c rr, no m/r/g obese, soft, nabs  labs: wbc way up: 30 today, Left shift t.bili up slighlty to 11  A/P:  - perform infectious w/u given the elev wbc ( UA, cxr, repeat u/s to look for ascites).    - cont lactulose 30 ml q4 hours - titrate to 2 -3 stools per day.  - consider head CT for the persisting AMS - cont prednisolone 40 mg daily - no further recs currently.   Lab Results:  Hepatic:  27-Jan-15 04:41   Bilirubin, Total  11.5  Alkaline Phosphatase  201 (45-117 NOTE: New Reference Range 11/04/13)  SGPT (ALT)  85  SGOT (AST)  151  Total Protein, Serum  5.7  Albumin, Serum  1.9  Routine Chem:  27-Jan-15 04:41   Glucose, Serum  151  BUN  40  Creatinine (comp) 1.18  Sodium, Serum  134  Potassium, Serum  3.4  Chloride, Serum 98  CO2, Serum 27  Calcium (Total), Serum 8.8  Osmolality (calc) 281  eGFR (African American) >60  eGFR (Non-African American) >60 (eGFR values <75m/min/1.73 m2 may be an indication of chronic kidney disease (CKD). Calculated eGFR is useful in patients with stable renal function. The eGFR calculation will not be reliable in acutely ill patients when serum creatinine is changing rapidly. It is not useful in  patients on dialysis. The eGFR calculation may not be applicable to patients at the low and high extremes of body sizes, pregnant women, and vegetarians.)  Anion Gap 9  Ammonia, Plasma < 10 (Result(s) reported on 10 Jan 2014 at 06:18AM.)  Routine Sero:  27-Jan-15 04:36   Occult Blood, Feces POSITIVE (Result(s) reported on 10 Jan 2014 at 05:23AM.)  Routine Hem:  27-Jan-15 06:06   WBC (CBC)  30.2  RBC (CBC)  2.02  Hemoglobin (CBC)  8.2  Hematocrit (CBC)  25.5  Platelet Count (CBC)  92 (Result(s) reported on 10 Jan 2014 at 07:19AM.)  MCV  126  MCH  40.5  MCHC 32.1  RDW  18.1  Bands 3   Segmented Neutrophils 91  Lymphocytes 3  Monocytes 3  NRBC 1  Diff Comment 1 ANISOCYTOSIS  Diff Comment 2 POLYCHROMASIA  Diff Comment 3 PLTS VARIED IN SIZE  Result(s) reported on 10 Jan 2014 at 07:19AM.   Electronic Signatures: RArther Dames(MD)  (Signed 27-Jan-15 17:50)  Authored: Details, Lab Results   Last Updated: 27-Jan-15 17:50 by RArther Dames(MD)

## 2015-04-07 NOTE — Consult Note (Signed)
Brief Consult Note: Diagnosis: Alcohol dependence.   Patient was seen by consultant.   Consult note dictated.   Recommend further assessment or treatment.   Comments: Psychiatry: Patient seen and chart reviewed.  Note dictated.  Patient with alcohol dependence who has developed cirrhosis.  Currently he is extremely ill with hepatic encephalopathy and physical disability.  Patient has no history of treatment for alcohol use in the past.  Stopping use of alcohol is clearly a "life or death" matter.  Reviewed with patient his alcohol use and the importance of stopping drinking.  Reviewed basic treatment options.  Discussed the plan with the patient and the wife.  Patient is not currently able to participate in substance abuse rehabilitation treatment because of his medical problems.  My expectation is that he would go back to medical rehabilitation.  If he recovers sufficiently to a state mentally and physically where he can participate in substance abuse rehabilitation he can apply for admission to an inpatient program.  We will try to get some information to them about such programs.  Electronic Signatures: Gonzella Lex (MD)  (Signed 26-Feb-15 21:51)  Authored: Brief Consult Note   Last Updated: 26-Feb-15 21:51 by Gonzella Lex (MD)

## 2015-04-07 NOTE — Consult Note (Signed)
Chief Complaint:  Subjective/Chief Complaint Covering for Dr. Rayann Heman. Sl encephalopathic. Denies having BM's. No record of this yest or so far today.   VITAL SIGNS/ANCILLARY NOTES: **Vital Signs.:   28-Feb-15 03:48  Vital Signs Type Routine  Temperature Temperature (F) 97.7  Celsius 36.5  Pulse Pulse 85  Respirations Respirations 20  Systolic BP Systolic BP 465  Diastolic BP (mmHg) Diastolic BP (mmHg) 75  Mean BP 90  Pulse Ox % Pulse Ox % 94  Pulse Ox Activity Level  At rest  Oxygen Delivery Non-invasive ventilation (CPAP/BIPAP)   Brief Assessment:  GEN no acute distress   Cardiac Regular   Respiratory clear BS   Gastrointestinal Normal  distended   Lab Results: Routine Chem:  28-Feb-15 05:53   Magnesium, Serum  1.1 (1.8-2.4 THERAPEUTIC RANGE: 4-7 mg/dL TOXIC: > 10 mg/dL  -----------------------)  Potassium, Serum  3.2 (Result(s) reported on 11 Feb 2014 at 06:50AM.)   Assessment/Plan:  Assessment/Plan:  Assessment Cirrhosis. Hepatic encephalopathy.   Plan Continue xifaxan. Increase lactulose until patient has 3-4 BM's per day.   Electronic Signatures: Verdie Shire (MD)  (Signed [phone] 11:34)  Authored: Chief Complaint, VITAL SIGNS/ANCILLARY NOTES, Brief Assessment, Lab Results, Assessment/Plan   Last Updated: 28-Feb-15 11:34 by Verdie Shire (MD)

## 2015-04-07 NOTE — H&P (Signed)
PATIENT NAME:  Troy [name], REVOIR MR#:  [id number] DATE OF BIRTH:  [birth year]  DATE OF ADMISSION:  01/05/2014  PRIMARY CARE PROVIDER: Dr. Jacqualine Code   EMERGENCY DEPARTMENT REFERRING PHYSICIAN: Dr. Jimmye Norman.   CHIEF COMPLAINT: Weakness, fall, and difficulty with ambulation.   HISTORY OF PRESENT ILLNESS: The patient is a 44 year old white male with significant history of drinking heavily for years who has had progressive weakness and difficulty walking over the past few weeks. It has progressively got worse. He was planning to go see his doctor but earlier today he became very weak and needed help to go to the bathroom and then when he went to the bathroom he kind of slid down the wall and fell. The patient was brought to the ED. He is noted to have elevated bilirubin. He is jaundiced. He was also noticed to have significant hyponatremia. Therefore, we are asked to admit the patient. The patient also reports that he has been short of breath for the past 2 to 3 weeks. He has not had any fevers or chills. He did have lower extremity swelling, but improved that has improved. He feels his abdomen is distended as well. He otherwise denies any abdominal pain, nausea, vomiting, or diarrhea. He denies any urinary symptoms.   PAST MEDICAL HISTORY: Significant for:  1.  Hypertension.  2.  Gout.   PAST SURGICAL HISTORY: Status post left hip replacement.   ALLERGIES: None.   CURRENT MEDICATIONS: He is on allopurinol 300 daily, amlodipine 5 daily, Benicar 40 daily, iron sulfate 325 mg 1 tab p.o. t.i.d., fish oil 1200 mg daily, methocarbamol 1 tab p.o. daily as needed, Protonix 40 one tab p.o. daily, and trazodone 50 mg at bedtime.  SOCIAL HISTORY: Does not smoke. Drinks 1.5 liters of [place] for many years. No drug use.   FAMILY HISTORY: Mother with Crohn's disease.   REVIEW OF SYSTEMS: CONSTITUTIONAL: Denies any fevers. Complains of fatigue, weakness. No weight loss. No weight gain.  EYES: No blurred or  double vision. No pain. No redness. No inflammation. No glaucoma. No cataracts.  ENT: No tinnitus. No ear pain. No hearing loss. No seasonal or year-round allergies. No epistaxis. No nasal discharge. No postnasal drip.  RESPIRATORY: No cough, wheezing. No hemoptysis. Does have dyspnea. No painful respirations.  CARDIOVASCULAR: Denies any chest pain or  orthopnea. Does have history of edema. No arrhythmias. No palpitation. No syncope.  GASTROINTESTINAL: No nausea, vomiting, diarrhea. No abdominal pain. No hematemesis. No melena. No ulcer. No GERD. No IBS. Has been jaundiced, noticed 2 to 3 weeks ago by his wife. GENITOURINARY: Denies any dysuria, hematuria, renal calculus or frequency.  ENDOCRINE: Denies any polyuria, nocturia or thyroid problems.  HEMATOLOGIC AND LYMPHATIC: Has history of anemia. No easy bruisability or bleeding.  SKIN: No acne. No rash. No changes in mole, hair or skin.  MUSCULOSKELETAL: Denies any pain in the neck, back or shoulder.  NEUROLOGIC: No numbness. No CVA. No TIA. No seizures.  PSYCHIATRIC: No anxiety. Does have a history of insomnia. No ADD. No OCD.   PHYSICAL EXAMINATION: VITAL SIGNS: Temperature 97.9, pulse 99, respirations 18, blood pressure 115/63.  GENERAL: The patient is an obese male, mildly dyspneic.  HEENT: Head atraumatic, normocephalic. Pupils equally round and reactive to light and accommodation. There is scleral icterus. There is no conjunctival pallor. Nasal exam shows no drainage or ulceration. Oropharynx is clear without any exudate.  NECK: Supple without any JVD.  CARDIOVASCULAR: Regular rate and rhythm. No murmurs, rubs, clicks or gallops.  PMI is not displaced.  LUNGS: Clear to auscultation bilaterally without any rales, rhonchi, wheezing.  ABDOMEN: Mildly distended. There is no hepatosplenomegaly.  EXTREMITIES: No clubbing, cyanosis or edema.  SKIN: No rash.  LYMPHATICS: No lymph nodes palpable.  VASCULAR: Good DP and PT pulses.  PSYCHIATRIC:  Not anxious or depressed.  NEUROLOGIC: Awake, alert and oriented x3. No focal deficits.   LABORATORY AND DIAGNOSTICS: Glucose 100, BUN 33, creatinine 1.06, sodium 121, potassium 4.7, chloride 86, CO2 23, calcium 9.4. Lipase is 808. LFTs: Total protein 6.5, albumin 2, bili total 10.7, alk phos 278, AST 267, ALT 119. Troponin less than 0.02. WBC 16.4, hemoglobin 9.7, platelet count 118. INR 1.3. Prothrombin time 15.5.   Ultrasound of the abdomen shows trace free fluid in the abdomen, findings consistent with liver cirrhosis.   ASSESSMENT AND PLAN: The patient is a 44 year old white male with history of alcohol abuse who presents with weakness, difficulty walking, fell today.  1.  Generalized weakness. Likely due to acute hepatitis, mild dehydration.  2.  Acute hepatitis. Likely due to alcohol, but could be also related to underlying other hepatitis so we will get a hepatitis panel. We will have gastroenterology evaluation. I will also ask gastroenterology to decide whether the patient needs any prednisone therapy for his liver alcohol-induced acute hepatitis.  3.  Alcohol abuse. We will place him on CIWA protocol. 4.  Hyponatremia. Likely due to liver cirrhosis. We will give him normal saline. Follow sodium.  5.  Shortness of breath. I will check a chest x-ray and an echo and check a d-dimer.  6.  Hypertension. We will hold his blood pressure medication. 7.  Coagulopathy. Due to alcohol abuse. Monitor his Coags. No evidence of acute bleeding.  8.  Gout. Continue allopurinol.  9.  Insomnia. Continue trazodone at bedtime.  10.  Miscellaneous. We will use SCDs for deep vein thrombosis prophylaxis in light of abnormal Coag profile.  TIME SPENT: 50 minutes. ____________________________ Lafonda Mosses Posey Pronto, MD shp:sb D: 01/05/2014 09:23:31 ET T: 01/05/2014 09:40:14 ET JOB#: [id number]  cc: Mialynn Shelvin H. Posey Pronto, MD, <Dictator> Alric Seton MD ELECTRONICALLY SIGNED 01/06/2014 8:16

## 2015-04-07 NOTE — Consult Note (Signed)
Details:   - I was called by Mr Cassada's wife with concerns.  I have not seen Mr Aultman.    I have ordered u/s with dopplers to eval for portal vein thrombosis given the rapid onset ascitres.    This is not SBP based on the ascites fluid analysis.  Would consider other sources of infection.   Also, would minimize use of narcotics given the cirrhosis and ETOH abuse history, esp IV dilaudid.  He does not have a specific etiology for pain at this point so should not be requiring IV pain meds.    Cont lactulose titrating to 2 - 3 stools per day.    Low Na diet, and ok for PO diuresis if creat, K+ , Na are closely monitored.    No indication for nadolol given he does not have know esophageal varices so would d/c that. .  Does need EGD to screen for varices but likely as outpatient.   If further recommendations or input neeed, please have low threshold to consult Dr Candace Cruise over the weekend.   Electronic Signatures: Arther Dames (MD)  (Signed 13-Mar-15 16:08)  Authored: Details   Last Updated: 13-Mar-15 16:08 by Arther Dames (MD)

## 2015-04-07 NOTE — Consult Note (Signed)
PATIENT NAME:  Troy [name], Troy [name] MR#:  [id number] DATE OF BIRTH:  [birth year]  SURGICAL CONSULTATION   DATE OF CONSULTATION:  02/22/2014  REFERRING PHYSICIAN:  Dr. Posey Pronto, [place].  PRIMARY CARE PHYSICIAN: Dr. Randel Books.  CONSULTING PHYSICIAN:  Jamiee Milholland A. Marina Gravel, MD  HISTORY: This is a 44 year old white male recently discharged from the hospital approximately a week ago with acute respiratory failure, metabolic encephalopathy secondary to hepatic cirrhosis and ascites, who underwent a large-volume paracentesis at that time which did not appear to be infected. He was discharged on lactulose to a skilled nursing facility. The patient has not had any alcohol intake since January.   Over the last 24 to 48 hours, the patient had progressive abdominal pain and distention. He had 4 to 5 bowel movements today as well as passage of flatus. He has had no nausea, no vomiting. No urinary symptoms. No chest pain, no shortness of breath. Surgical services were contacted by Dr. Cinda Quest of the Emergency Room for surgical evaluation of possible small bowel obstruction based on the CT scan findings. The patient has now been admitted to medicine.   ALLERGIES: None.   MEDICATIONS: Allopurinol, amlodipine, iron, fish oil, Lasix, Klor-Con, lactulose, Synthroid, magnesium oxide, Protonix, spironolactone, thiamine.    SOCIAL HISTORY: The patient has a strong history of alcohol use. No drugs. No smoking.   PAST SURGICAL HISTORY: Significant for left hip replacement.   FAMILY HISTORY: Noncontributory.   PAST MEDICAL HISTORY: Significant for: 1.  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 2.  History of ascites requiring paracentesis.  3.  History of mental status changes secondary to #1.  4.  Hypothyroidism.  5.  Hypertension.  6.  Malnutrition.  7.  Anemia and thrombocytopenia.  8.  Gout.   PAST SURGICAL HISTORY: None.   REVIEW OF SYSTEMS: As described above and as per ten-point review,  otherwise unremarkable.   PHYSICAL EXAMINATION:  GENERAL: Ill-appearing obese white male. Sister and wife are at bedside. The patient is alert and oriented, cooperative and provides history.  VITAL SIGNS: Temperature is 98.9, pulse 118, blood pressure is 145/77, pulse oximetry is 94% on room air. Respiratory rate is 21.  HEENT: No signs of encephalopathy. Sclerae are slightly icteric.  LUNGS: Clear.  HEART: Tachycardic.  ABDOMEN: Distended, tympanitic with few bowel sounds and tender throughout all four quadrants. There are no scars, no hernias. There are minor petechiae on the skin throughout the abdomen. There are no obvious hernias.  EXTREMITIES: Demonstrate 1+ pitting edema in the lower extremities.  SKIN: Warm and well perfused.  NEUROLOGIC: As described above and otherwise unremarkable today.  PSYCHIATRIC: As described above and otherwise unremarkable today.   LABORATORY VALUES: Glucose 111, creatinine 0.72, sodium 136, potassium 4.4, lipase 248, serum albumin 2.6, total bilirubin 2.3, alkaline phosphatase 196, AST 44, ALT 54. White count is 25.1, hemoglobin 9.3, hematocrit 29.1, platelet count 113,000.   Review of CT scan demonstrates enlarged left hepatic lobe. There is air and fluid throughout the stomach. Small bowel, colon, and rectum with no obvious identifiable transition point. There is a moderate amount of ascites. 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   RECOMMENDATIONS: At present, I see no indication for surgical intervention. The patient would benefit from a diagnostic and therapeutic paracentesis.  He was already begun on broad-spectrum antibiotics. He is a high risk surgical patient.    We will follow the patient with  you.   TOTAL TIME SPENT: 60 minutes.   ____________________________ Jeannette How Marina Gravel, MD mab:np D: 02/22/2014 22:12:33 ET T: 02/22/2014 22:28:40 ET JOB#: [id number]  cc: Elta Guadeloupe A. Marina Gravel, MD, <Dictator> Milinda Pointer. Jacqualine Code, MD  Elta Guadeloupe Bettina Gavia MD ELECTRONICALLY SIGNED 02/23/2014 8:48

## 2015-04-07 NOTE — H&P (Signed)
PATIENT NAME:  Troy [name], Troy [name] MR#:  [id number] DATE OF BIRTH:  07-07-1971  DATE OF ADMISSION:  02/06/2014  PRIMARY CARE PHYSICIAN:  Fara Olden B. Jacqualine Code, MD  CHIEF COMPLAINT: Encephalopathy.   HISTORY OF PRESENT ILLNESS:  A 44 year old male who was recently discharged about 2 weeks ago, who presented initially with weakness at the last hospitalization, found to have acute hepatitis, started on steroids and at that time he had persistent leukocytosis thought to be secondary to steroids, who presents today with encephalopathy. Over the past 3 days, the patient has had increasing confusion, no neurological deficits although his wife does notice that his speech has been slurred through all of this. He was discharged on oxygen during his last admission; however, over the past week the patient has not had any oxygen in the rehab center. On Sunday, they put their oxygen back on. He presents with just altered mental status. He seems very confused. His sclerae are icteric, which the patient's wife says is actually improved since his last hospitalization. During the last hospitalization, also he was diagnosed with hypothyroidism with a TSH of 24.4. He was placed on Synthroid.   REVIEW OF SYSTEMS: Unable to obtain as the patient has significant confusion.   PAST MEDICAL HISTORY:   1.  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  2.  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  He also had persistently elevated leukocytosis thought to be secondary to his steroids.  3.  Hypertension.  4.  Gout.  5.  New diagnosis of hypothyroidism.    MEDICATIONS: 1.  Allopurinol 300 mg daily.  2.  Norvasc 5 mg daily.  3.  Ferrous sulfate 325 t.i.d.  4.  Fish oil 1200 mg t.i.d.  5.  Lasix 20 mg 2 tablets daily.  6.  Lactulose 15 mL b.i.d.  7.  Synthroid 75 mcg daily.  8.  Gemfibrozil 40 mg daily.  9.  Prednisolone 13.33 mL daily.   PAST SURGICAL  HISTORY:  Left hip replacement.   ALLERGIES: No known drug allergies.   SOCIAL HISTORY: The patient quit drinking during the last hospitalization. His last drink was January 05, 2014. No IV drug use or smoking.   FAMILY HISTORY: Positive for Crohn's disease.   LABORATORY, DIAGNOSTIC AND RADIOLOGICAL DATA:   1.  PH 7.37, pCO2 55, pO2 55, FiO2 36%, this was on 4 liters.  2.  White blood cell count 2.9 which is increased since his last hospitalization, hemoglobin 9.3, hematocrit 29.6, platelets are 78.  3.  Sodium 139, potassium 3.5, chloride 103, bicarb 30, BUN 29, creatinine 0.84, glucose 166, calcium 8.2. Bilirubin 3.8, alk phos 213, ALT 127, AST 116, total protein 5.9, albumin is 2.1, magnesium level is low at 1.2.  4.  Alcohol level less than 3.  5.  Lipase 169. INR is 1.2.  6.  Chest x-ray shows cardiomegaly with interstitial prominence, edema or pneumonitis cannot be excluded.  7.  EKG shows sinus rhythm with fusion complex.   ASSESSMENT AND PLAN:  A 44 year old male who was recently discharged from the hospital with alcoholic hepatitis and leukocytosis thought to be secondary to steroids for his alcohol hepatitis, who presents with encephalopathy.  1.  Encephalopathy. The patient could have many etiologies of encephalopathy at this time. He had an abdominal ultrasound during the last hospitalization which did not show a significant amount of ascites. He actually even had a paracentesis which showed no evidence of  abdominal ascites so I do not think the patient has encephalopathy secondary to subacute bacterial peritonitis. The patient has been constipated so this could cause encephalopathy. Also, his ammonia level is elevated. He may also have a urine infection which we are checking for and I have also ordered a CT of the head. The patient was supposed to be on oxygen and apparently, he had been taken off of oxygen and now is hypoxic, so hypoxia could be causing encephalopathy as well so there  are many things on the differential. At this time, I have ordered CT of the head, urinalysis. Last hospitalization a TSH already performed which he was started on Synthroid due to his elevated TSH. A B12 was also performed and so am not going to repeat these. He is currently on supplemental oxygen for his hypoxia. We will increase the lactulose to treat his constipation as well as his ammonia level. Would do neuro checks q.4 hours and we will have to monitor the patient carefully.  2.  Acute hypoxic respiratory failure on chronic respiratory failure. The patient was discharged with oxygen during his last hospitalization. Echocardiogram showed normal ejection fraction of approximately 50%.  The patient has evidence of a pneumonia on the chest x-ray. He had a CT scan to look for pulmonary embolism during the hospitalization which was negative as well. I will treat his pneumonia with Levaquin and Zosyn since he was recently hospitalized for over a week. We will continue oxygen therapy.  3.  Alcohol hepatitis. We will continue with prednisolone and obtain a gastroenterology consultation. His LFTs appear better than previous.  4.  Hypomagnesemia.  We will go ahead and replete with IV magnesium.  5.  Pneumonia. As mentioned, will place the patient on Zosyn and Levaquin. Blood cultures have been ordered.  6.  Hypothyroidism. Continue Synthroid. TFTs should be repeated sometime in mid March.  7.  Liver cirrhosis from alcohol. His last drink was prior to his last hospitalization in January. He does not need clinical institute withdrawal assessment of alcohol at this time.  8.  Hypertension. Continue Norvasc.  9.  Elevated white blood cell count. This is chronic, thought to be due to steroids. His white blood cells actually improved from last hospitalization.   CODE STATUS:  The patient is full code.    TIME SPENT: Approximately 50 minutes.     ____________________________ Donell Beers. Benjie Karvonen,  MD spm:cs D: 02/06/2014 16:50:16 ET T: 02/06/2014 18:15:19 ET JOB#: [id number]  cc: Aldwin Micalizzi P. Benjie Karvonen, MD, <Dictator> Milinda Pointer. Jacqualine Code, MD Arther Dames, MD Donell Beers Cimberly Stoffel MD ELECTRONICALLY SIGNED 02/06/2014 19:42

## 2015-04-07 NOTE — Consult Note (Signed)
Chief Complaint and History:  Primary Physician Dr. Debbora Dus   Referring Physician Dr. Bridgette Habermann   Chief Complaint hypothyroidism   Allergies:  No Known Allergies:   Assessment/Plan:  Assessment/Plan Patient was seen in consultation. 44 yo M with alcoholic cirrhosis admitted with encephalopathy. He was interviewed, examined and chart reviewed. He was diagnosed with hypothyroidism on 1/23 and started on levothyroxine 75 mcg daily. I confirmed that after last hosp d/c, he continued to recieve the medication daily at [facility]. A repeat TSH on 02/12/14 was 60. He does have dry skin and weight gain. No cold intolerance. No constipation, however he is on lactulose.   A/ Hypothyroidism, uncontrolled  P/ Degree of elevation of TSH ceratinly unexpected as he's been on levothyroxine for about 5 weeks now. Suspect an element of malabsorption of the levothyroxine. Will inc dose to 150 mcg daily. Ask that the LT4 be given each morning at same time, on empty stomach, and wait 60 min before eatinging/drinking anything other than water.  Full consult will be dictated.  Will schedule a clinic follow-up in 4-6 weeks.   Electronic Signatures: Judi Cong (MD)  (Signed 02-Mar-15 13:40)  Authored: Chief Complaint and History, ALLERGIES, Assessment/Plan   Last Updated: 02-Mar-15 13:40 by Judi Cong (MD)

## 2015-04-07 NOTE — Consult Note (Signed)
Chief Complaint:  Subjective/Chief Complaint The patient is awake and family startes he is back to baseline mentally. Wants to go to rehab upon discharge. Now with a lot of diarrhea as reported by patient.   VITAL SIGNS/ANCILLARY NOTES: **Vital Signs.:   31-Jan-15 04:33  Vital Signs Type Routine  Temperature Temperature (F) 97.6  Celsius 36.4  Temperature Source oral  Pulse Pulse 96  Respirations Respirations 20  Systolic BP Systolic BP 419  Diastolic BP (mmHg) Diastolic BP (mmHg) 79  Mean BP 96  Pulse Ox Activity Level  At rest  Oxygen Delivery 2L   Brief Assessment:  GEN well developed, well nourished, no acute distress   Respiratory normal resp effort   Gastrointestinal Distended   Lab Results: Routine Chem:  31-Jan-15 09:29   Glucose, Serum  205  BUN  42  Creatinine (comp)  1.31  Sodium, Serum  131  Potassium, Serum  3.1  Chloride, Serum  95  CO2, Serum 29  Calcium (Total), Serum 8.6  Anion Gap 7  Osmolality (calc) 279  eGFR (African American) >60  eGFR (Non-African American) >60 (eGFR values <60m/min/1.73 m2 may be an indication of chronic kidney disease (CKD). Calculated eGFR is useful in patients with stable renal function. The eGFR calculation will not be reliable in acutely ill patients when serum creatinine is changing rapidly. It is not useful in  patients on dialysis. The eGFR calculation may not be applicable to patients at the low and high extremes of body sizes, pregnant women, and vegetarians.)   Assessment/Plan:  Assessment/Plan:  Assessment Etoh Liver disease.   Plan Patient doing much better but is having diarrhea. Will decrease lactulose to 167mbid.   Electronic Signatures: WoLucilla LameMD)  (Signed 31-Jan-15 10:21)  Authored: Chief Complaint, VITAL SIGNS/ANCILLARY NOTES, Brief Assessment, Lab Results, Assessment/Plan   Last Updated: 31-Jan-15 10:21 by WoLucilla LameMD)

## 2015-04-07 NOTE — Discharge Summary (Signed)
PATIENT NAME:  Troy [name], Troy [name] MR#:  [id number] DATE OF BIRTH:  [birth year]  DATE OF ADMISSION:  01/05/2014 DATE OF DISCHARGE:  01/17/2014  This summary should cover from the 31st of January until February 3rd.   CONSULTANTS: Dr. Ola Spurr from infectious disease, Dr. Verdie Shire, Dr. Arther Dames, and Dr. Jiles Garter from gastroenterology.   PRIMARY CARE PHYSICIAN: Debbora Dus, MD   FINAL DIAGNOSES: 1.  Generalized weakness due to alcoholic hepatitis.  2.  Alcoholic hepatitis.  3.  Hyponatremia.  4.  Likely underlying cirrhosis.  5.  Alcohol abuse.  6.  Hypertension.  7.  Gout.  8.  Insomnia.  9.  Hepatic encephalopathy.  10.  Leukocytosis secondary to steroids as well as inflammatory response in the liver.  11.  Gout.  12.  Insomnia.   DISCHARGE MEDICATIONS: Allopurinol 300 mg once a day, amlodipine 5 mg daily, vitamin D3 4000 units daily, pantoprazole 40 mg once a day, fish oil 1200 mg 3 times a day, ferrous sulfate 325 mg 3 times a day, prednisolone 15 mg/5 mL please take 13.33 mL orally once a day, levothyroxine 75 mcg once a day, lactulose 15 mL 2 times a day, furosemide 20 mg daily.   DISCHARGE DIET: Low sodium, low fat, low cholesterol.   DISCHARGE ACTIVITY: As tolerated.   DISCHARGE FOLLOWUP: Please follow with Dr. Rayann Heman from GI in 1 to 2 weeks. Please follow with PCP within 1 to 2 weeks.   DISPOSITION: To rehab.   CODE STATUS: FULL code.   RESULTS: Significant laboratories since the 31st of January: BUN was 42 on January 31st and creatinine 1.31. Last BUN 35 and creatinine 0.97, on February 1st. Last bilirubin 9.9, direct bilirubin 8, alk phos 189, AST 155, and ALT 125. Total bilirubin has come down from the 11 range. Last white count 30.7, which was actually the white count on January 30th as well as February 3rd. Hemoglobin 9.3 and platelets 100. C. diff negative on the 31st of January.   HOSPITAL COURSE: Since the 31st of January, the patient has done relatively well. He  did have persistent significant leukocytosis and infectious disease was consulted. The patient was seen by Dr. Ola Spurr on the 2nd of February. We have negative blood cultures as well as C. diff. He is not short of breath, hypoxic, or have a productive cough to suggest he is having a pneumonia. He has no significant UTI symptoms as well and at this point per infectious disease if he does have fevers or change in mental status antibiotics could be considered, but at this point steroids as well as hepatitis and inflammatory change in the liver are likely causing the leukocytosis. The patient is to follow with Dr. Rayann Heman and continue the prednisolone for the next month and then taper by Dr. Rayann Heman would be started as an outpatient. In regards to his hepatic encephalopathy, this has improved and he is on lactulose. He is on room air and at this point will be discharged to a rehab facility.   PHYSICAL EXAMINATION: VITAL SIGNS: On the day of discharge, his temperature is 97.6, pulse rate 93, respiratory rate 20, blood pressure 142/90, and O2 sat 93% on room air.  GENERAL: The patient is a morbidly obese male lying in bed. He is jaundiced. HEENT: Normocephalic, atraumatic, pupils are equal, and the patient does have icteric sclerae. Moist mucous membranes.  NECK: Supple.  CARDIOVASCULAR: S1, S2 regular. No significant murmurs.  LUNGS: Clear.  ABDOMEN: Soft, slightly distended but nontender.  No rebound or guarding. EXTREMITIES: No lower extremity edema.   At this point, he will be discharged to a rehab facility, and he should follow with the GI clinic in the next 1 to 2 weeks and his white blood cell count should be monitored periodically. The patient is FULL code.   TOTAL TIME SPENT: 40 minutes.  ____________________________ Vivien Presto, MD sa:sb D: 01/17/2014 13:22:13 ET T: 01/17/2014 13:35:32 ET JOB#: [id number]  cc: Vivien Presto, MD, <Dictator> Vivien Presto MD ELECTRONICALLY SIGNED 01/28/2014  10:57

## 2015-04-07 NOTE — Consult Note (Signed)
I have seen and examined Troy [name] and agree with [name] a/p.  currently has ETOH hepatitis and discriminant function > 32 so warrants treatment with prednisolone.  is very large but very little ascites on u/s, not enough to do paracentesis.  asterixis on exam, unclear to me if this is his baseline mental status.    man needs to stop drinking alcohol.   If he doesn't already have cirrhosis, he will soon. Prednisolone 40 mg dailydaily LFT and INRlactulose 30 ml TID, titrate to 2 stols per day ( not clear he is really encephalopathic)monitor for ETOH withdrawal.  will cont to follow.   Electronic Signatures: Arther Dames (MD)  (Signed on 22-Jan-15 18:02)  Authored  Last Updated: 22-Jan-15 18:02 by Arther Dames (MD)

## 2015-04-07 NOTE — Consult Note (Signed)
Details:   - GI note.  S: still not at baseline mental status.  no complaints. No abd pain, n/v.  Moving bowels few times per day.   O: a and 0 x 3 asterixis improved cta, no w/c rrr, no m/r/g obese, soft   - A/P:  ETOH Hepatitis: mental status slowly improving.  - t. bili 11.5,  WBC still elevated at 30.  Asterixis improved. - cont prednisolone 40 mg daily - cont lactulose 30 ml q4 hr, titrate to 2 -3 stools per day.  - infectious w/u. (cxr, ua, bcx).   Lab Results:  Routine Hem:  28-Jan-15 13:27   WBC (CBC)  31.1  RBC (CBC)  2.34  Hemoglobin (CBC)  9.3  Hematocrit (CBC)  29.4  Platelet Count (CBC)  110 (Result(s) reported on 11 Jan 2014 at 02:01PM.)  MCV  126  MCH  39.5  MCHC  31.5  RDW  17.3  Bands 5  Segmented Neutrophils 88  Lymphocytes 2  Monocytes 2  Metamyelocyte 2  Myelocyte 1  NRBC 1  Diff Comment 1 ANISOCYTOSIS  Diff Comment 2 POLYCHROMASIA  Diff Comment 3 MACROCYTES PRESENT  Diff Comment 4 BASOPHILIC STIPPLING  Diff Comment 5 TOXIC GRANULATION  Diff Comment 6 NORMAL PLT MORPHOLGY  Result(s) reported on 11 Jan 2014 at 02:01PM.   Electronic Signatures: Arther Dames (MD)  (Signed 28-Jan-15 16:19)  Authored: Details, Lab Results   Last Updated: 28-Jan-15 16:19 by Arther Dames (MD)

## 2015-04-07 NOTE — H&P (Signed)
PATIENT NAME:  Troy [name], Troy [name] MR#:  [id number] DATE OF BIRTH:  09/14/1971  DATE OF ADMISSION:  10/26/2014  PRIMARY CARE PHYSICIAN:  Debroah Baller, MD    REFERRING PHYSICIAN:  Tommie Raymond, MD   CHIEF COMPLAINT: Seizure today.   HISTORY OF PRESENT ILLNESS: A 44 year old Caucasian male with a history of liver cirrhosis, alcohol abuse who presented to the ED with above chief complaint. The patient is an alert, awake now. According to patient and the patient's wife, the patient drank alcohol for the past 1 week, about 375 mL every day. He stopped drinking alcohol yesterday and developed a seizure today. The patient was noticed to have 2 episodes of seizure, 1 lasted 15 minutes with post seizure confusion about 10 minutes and another 1 lasted about 5 minutes with post confusion about 5 to 10 minutes. The patient did not fall, did not have any an injury, but the patient had loss of consciousness, bit his tongue. Seizure was generalized shaking all over; the patient was sent to the ED for further evaluation. The patient was treated with Ativan in the ED.   A CAT scan of head did not show any acute intracranial injury or abnormality but the patient was noted to have elevated liver function tests and bilirubin.   REVIEW OF SYSTEMS:  The patient denies any fever or chills; he complains of headache and weakness. He denies any incontinence, hematuria, or dysuria. He mentioned that had a dark stool today.   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   SOCIAL HISTORY: Denies any smoking, but drinks alcohol heavily on a daily basis; denies any drug abuse.   PAST SURGICAL HISTORY: Left hip replacement.   FAMILY HISTORY: Crohn disease.   ALLERGIES: None.   HOME MEDICATIONS: Allopurinol 300 mg p.o. daily, Norvasc 5 mg p.o. daily, ferrous sulfate 325 mg p.o.  t.i.d., fish oil 1200 capsule t.i.d., Lasix 40 mg p.o. daily, gabapentin 100 mg 3 caps at bedtime p.r.n. for nerves, levothyroxine 100 mcg p.o. daily, magnesium oxide 400 mg p.o. b.i.d., nadolol 20 mg p.o. daily, pantoprazole 40 mg p.o. daily, potassium chloride 20 mEq t.i.d.,    rifaximin 550 mg p.o. b.i.d., spironolactone 50 mg p.o. daily, tramadol 50 mg p.o. 2 tablets every 6 hours p.r.n.   REVIEW OF SYSTEMS:   CONSTITUTIONAL: The patient denies any fever or chills but has headache, dizziness, and weakness.  EYES: No double vision or blurred vision.  ENT: No postnasal drip, slurred speech or dysphagia.  CARDIOVASCULAR: No chest pain, palpitation, orthopnea, nocturnal dyspnea, no leg edema.  PULMONARY: No cough, sputum, shortness of breath or hematemesis.  GASTROINTESTINAL: No abdominal pain, nausea, vomiting, diarrhea, no melena or bloody stools.  GENITOURINARY: No dysuria, hematuria, or incontinence.  SKIN: No rash or jaundice.  NEUROLOGIC: Positive for seizure, loss of consciousness, and headache.  ENDOCRINE: No polyuria, polydipsia, heat or cold intolerance.  HEMATOLOGIC: No easy bruising, bleeding.   VITAL SIGNS: Temperature 98, blood pressure 124/78, pulse 104, oxygen saturation 96% on room air.   PHYSICAL EXAMINATION:  GENERAL: The patient is alert, awake, oriented, in no acute distress.  HEENT: Pupils round, equal and reactive to light and accommodation, moist oral mucosa, clear oropharynx, tongue bruises due to tongue bite.   NECK: Supple. No JVD or carotid bruit, no lymphadenopathy.  CARDIOVASCULAR: S1, S2 regular rate, tachycardia, no murmurs or gallops.  PULMONARY: Bilateral air entry. No wheezing  or rales. No use of accessory muscle to breathe.  ABDOMEN: Soft, obese and distended, no tenderness, difficult to estimate whether the patient has organomegaly, bowel sounds present.  EXTREMITIES: No edema, clubbing or cyanosis, no calf tenderness, bilateral peripheral pulses present.   SKIN: No rash or jaundice.  NEUROLOGIC: The patient is alert, awake, oriented this time; no obvious intracranial deficit, but power about 1 to 2 out of 5, on bilateral lower extremities, sensation intact.   LABORATORY DATA: CAT scan of head, no intracranial abnormality, glucose 124, BUN 11, creatinine 0.73, sodium 137, potassium 3.4, chloride 98, bicarbonate 29, calcium 6.6, bilirubin 4.2, alkaline phosphatase 193, SGPT 88, SGOT 367, albumin 3.2, WBC 6.9, hemoglobin 12.2, platelets 454,000.   IMPRESSIONS: 1.  Seizure, possibly caused by delirium tremens and alcohol withdrawal.  2.  Delirium tremens.  3.  Alcohol withdrawal.  4.  Hypocalcemia.  5.  Hypokalemia.  6.  Abnormal liver function tests.  7.  Liver cirrhosis.  8.  Questionable ascites.   PLAN OF TREATMENT:   1.  The patient will be admitted to stepdown unit. We will closely monitor patient's vital signs, watch for withdrawal. We will start CIWA protocol with Ativan p.r.n. and a banana bag.  2.  Give magnesium and potassium supplement. The patient was treated with calcium in ED, follow-up calcium level, magnesium level, and potassium level.  3.  Start neuro checks; seizure, fall, and aspiration precautions.  4.  Continue patient's nadolol, Rifaximin, spironolactone, and pantoprazole.  5.  We will get abdominal ultrasound due to abnormal liver function tests and rule out ascites.  6.  For thrombocytopenia, we will monitor CBC.   I discussed the patient's condition and plan of treatment with the patient and the patient's wife.   TIME SPENT: About 63 minutes.    ____________________________ Demetrios Loll, MD qc:nt D: 10/26/2014 17:03:33 ET T: 10/26/2014 17:55:41 ET JOB#: [id number]  cc: Demetrios Loll, MD, <Dictator> Demetrios Loll MD ELECTRONICALLY SIGNED 10/27/2014 16:38

## 2015-04-07 NOTE — Discharge Summary (Signed)
PATIENT NAME:  Troy [name], Troy [name] MR#:  [id number] DATE OF BIRTH:  January 23, 1971  DATE OF ADMISSION:  10/26/2014 DATE OF DISCHARGE:  10/27/2014  PRESENTING COMPLAINT: Seizures.   DISCHARGE DIAGNOSES: 1. Seizures, suspected alcohol use.  2. Chronic alcoholism.  3. History of cirrhosis with ascites.  4. 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  5. History of peripheral neuropathy.   MEDICATIONS: 1. Allopurinol 300 mg daily.  2. Amlodipine 5 mg daily.  3. Protonix 40 mg daily.  4. Fish oil 1200 mg 1 capsule 3 times a day.  5. Ferrous sulfate 325, 1 tablet 3 times a day.  6. Rifaximin 550 mg 1 tablet b.i.d.  7. Spironolactone 50 mg daily.  8. Tramadol 150 mg 2 tablets every 6 hours as needed.  9. Nadolol 20 mg, 1 p.o. daily.  10. Potassium chloride 20 mEq extended release p.o. 3 times a day.  11. Mag-Ox 400 mg daily b.i.d.   12. Levothyroxine 100 mcg p.o. daily.  13. Lasix 40 mg daily.  14. Gabapentin 100 mg 3 capsules at bedtime as needed for nerves.  15. Lyrica 75 mg b.i.d. for 1 week and then increase to 2 tablets 2 times a week. This was started by Dr. Jayme Cloud for neuropathy.  16. Calcium with vitamin D 1 tablet daily.   DISCHARGE INSTRUCTIONS:  1. No exertional activity, heavy lifting, operating heavy machinery or driving for 6 months.  2. Follow up with Dr. Jacqualine Code in 1 to 2 weeks.  3. Follow up with [facility] in 2 to 4 weeks.   LABORATORY DATA: Potassium is 3.8, magnesium is 2.2. White count is 3.5, hemoglobin and hematocrit 10 and 29.7, platelet count 35,000. Calcium is 6.9. Urine drug screen negative. 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 No intracranial mass, hemorrhage or acute-appearing infarct.   CONSULTATIONS: Neurologic consultation with Dr. Jayme Cloud.   BRIEF SUMMARY OF HOSPITAL COURSE: Mr. Rutledge is a 44 year old,  Caucasian gentleman with history of chronic alcohol abuse, history of alcohol-induced seizures, cirrhosis of liver with ascites and paracentesis multiple times in the past, comes in with:  1. 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 The patient lately has been drinking more than usual. CT head negative. No acute changes. Chronic changes remain the same. No seizures since being in the ER or in the hospital. Alcohol cessation is recommended. Seen by neurology. Recommendations noted.  2. Chronic hepatitis due to alcohol abuse. Supportive care.  3. Pancytopenia due to chronic alcoholism.  4. Electrolyte abnormalities, repleted. The patient is prescribed potassium, magnesium and calcium as an outpatient.  5. Liver cirrhosis due to severe drinking problem. No withdrawal symptoms, was on CIWA protocol.  6. Hospital stay otherwise remained stable.  7. Discharge plan was discussed with the patient and the patient's wife.   TIME SPENT: 40 minutes.    ____________________________ Hart [place] Posey Pronto, MD sap:TT D: 10/27/2014 13:54:28 ET T: 10/27/2014 19:00:10 ET JOB#: [id number]  cc: Eduardo Wurth A. Posey Pronto, MD, <Dictator> Mila Homer. Tamala Julian, [place] Jacqualine Code, MD Ilda Basset MD ELECTRONICALLY SIGNED 11/16/2014 14:04

## 2015-04-07 NOTE — Consult Note (Signed)
Primary Care Physician:  Benjie Karvonen, Sital : [facility], [facility], [address] [place], [place] [address], [place] : [facility], [address], [place], [place] [address], [phone]  Reason for Consult: Admit Date: 06-Feb-2014  Chief Complaint: AMS and abnormal MRI brain   History of Present Illness: History of Present Illness:   History from pt and wife. 44 year old male who was recently discharged in early Feb 2015. who presented initially with weakness at the last hospitalization, found to have acute hepatitis, started on steroids and at that time he had persistent leukocytosis thought to be secondary to steroids, who presents today with encephalopathy. Pt was having confusion, no neurological deficits although his wife does notice that his speech has been slurred through all of this. He was discharged on oxygen during his last admission; however, over the past week the patient has not had any oxygen in the rehab center. On Sunday, they put their oxygen back on. He presents with just altered mental status. He seems very confused. His sclerae are icteric, which the patient's wife says is actually improved since his last hospitalization. During the last hospitalization, also he was diagnosed with hypothyroidism with a TSH of 24.4. He was placed on Synthroid.  admission his cofusion might have improved little bit but wife was very concern about increased WM changes on his MRI brain.   REVIEW OF SYSTEMS: Unable to obtain as the patient has significant confusion.  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  He also had persistently elevated leukocytosis thought to be secondary to his steroids.  Hypertension.  Gout.  New diagnosis of hypothyroidism.   SURGICAL HISTORY:  Left hip replacement.   No known drug allergies.  HISTORY: The patient quit drinking during the last hospitalization. His last drink was January 05, 2014. No IV drug use or smoking.  HISTORY: Positive for Crohn's disease.   ROS:  General fever   HEENT no complaints   Lungs no complaints   Cardiac no complaints   GI constipation   GU no complaints   Musculoskeletal muscle ache   Extremities swelling   Skin rash  bruising   Neuro numbness/tingling   Endocrine no complaints   Psych no complaints   Allergies:  No Known Allergies:   Vital Signs: **Vital Signs.:   27-Feb-15 19:30  Vital Signs Type Routine  Temperature Temperature (F) 97.6  Celsius 36.4  Temperature Source oral  Pulse Pulse 93  Respirations Respirations 20  Systolic BP Systolic BP 209  Diastolic BP (mmHg) Diastolic BP (mmHg) 75  Mean BP 90  Pulse Ox % Pulse Ox % 97  Pulse Ox Activity Level  At rest  Oxygen Delivery 2L   EXAM: General Exam  middle age, WM with beard, wife at bedside Patient looks appropriate of age, poorly nourished and appropriately groomed.   Cardiovascular Exam: S1, S2 heart sounds present Carotid exam revealed no bruit Lung exam was clear to auscultation belly soft UNUSUAL LESIONS ON HIS TONGUE  Neurological Exam      Mental Status:      Alert, not   Oriented to day/date but knows this is 01/2014, obama president, previous presidet bush,      Attention span and concentration seemed reduced     Memory seemed impaired.     Intact naming, repetition, comprehension.  Followed 2 step commands - no dysarthria     Fund of knowledge seemed appropriate for age and health status.       Cranial Nerves:      Olfactory and vagus nerves not are examined      Visual fields were full      Pupils were equal, round and reactive to light and accommodation      Extra-ocular movements are normal      Facial sensations are normal      Face is symmetric      Finger rub was heard symmetric in both  ears      Palate and uvular movements are normal and oral sensations are OK      Neck muscle strength and shoulder shrug is normal      Tongue protrusion and uvular elevation are normal       Motor Exam:      Tone is normal in all extremities      Muscle strength in all extremities is 5/5 IN UE, very weak in LE, tender to touch, strength 3/5 in LE. +ve asterixis       Deep Tendon Reflexes:      symmetric 1 + in UE      Right Toes are down going,  Left Toes are down going            Sensory Exam:      Sensations were intact to light touch in UE but reduced in LE.            Co-ordination:      Finger to nose is impaired            Gait:      Gait not checked due to profound weakness in LE - high fall risk.  Lab Results: BF Analysis:  25-Feb-15 11:13   Protein, BF 1.2  Body Fluid Source PARACENTESIS FLUID  Result(s) reported on 08 Feb 2014 at 01:11PM.  Albumin, Body Fluid 0.6 (Result(s) reported on 08 Feb 2014 at 01:11PM.)  Body Fluid Source (CC) PARACENTESIS FLUID  Color - (BF) YELLOW  Clarity (BF) CLEAR  NCC  (nucleated cell count) 277  Neutrophils  (BF) 56  Lymphocytes  (BF) 21  Monocytes/Macrophages  (BF) 23  Eosinophil  (BF) 0  Basophil  (BF) 0  Other Cells  (BF) 0  Hepatic:  27-Feb-15 06:33   Bilirubin, Total  2.6  Alkaline Phosphatase  236 (45-117 NOTE: New Reference Range 11/04/13)  SGPT (ALT)  87  SGOT (AST)  66  Total Protein, Serum  6.0  Albumin, Serum  2.0  LabUnknown:  25-Feb-15 11:13   Result Interpretation Cytospin slide of paracentesis fluid reviewed. There are mostly neutrophils, with some lymphocytes and mesothelial cells. No malignant cells are identified. Blain Pais, MD.  Routine Micro:  25-Feb-15 11:13   Micro Text Report BODY FLUID CULTURE   COMMENT                   NO GROWTH IN 36 HOURS   GRAM STAIN                MODERATE WHITE BLOOD CELLS   GRAM STAIN                FEW RED BLOOD CELLS   GRAM STAIN                NO ORGANISMS SEEN    ANTIBIOTIC        Specimen  Source PARACENTESIS  Culture Comment NO GROWTH IN 36 HOURS  Gram Stain 1 MODERATE WHITE BLOOD CELLS  Gram Stain 2 FEW RED BLOOD CELLS  Gram Stain 3 NO ORGANISMS SEEN  Result(s) reported on 10 Feb 2014 at 09:43AM.  General Ref:  25-Feb-15 11:13   Abdominal Fluid (Cytology) ========== TEST NAME ==========  ========= RESULTS =========  = REFERENCE RANGE =  ABDOMINAL FLUID (CYTO)  No. of containers.Marland Kitchen01 CYTYC Thin Prep Vial Abdominal Fluid Cytology Source:                         [   Final Report         ]       ABDOMINAL FLUID Clinician provided ICD9:        [   Final Report         ]                   TX DIAGNOSIS:                      [   Final Report         ]                   ABDOMINAL FLUID NEGATIVE FOR MALIGNANT CELLS. MESOTHELIAL CELLSARE PRESENT. THIS INTERPRETATION INCLUDES EVALUATION OF A CELL BLOCK. Signed out by:                  [   Final Report         ]                   Galvin Proffer, MD, Pathologist Performed by:                   [   Final Report         ]        Earma Reading, Cytotechnologist (ASCP) Gross description:              [   Final Report         ]                   700 ML, YELLOW, CLOUDY /LCS               [facility]            No: [id number]           [address] [place], [place] [address]           Lindon Romp, MD         [phone]   Result(s) reported on 10 Feb 2014 at 09:21AM.  Routine Chem:  23-Feb-15 14:24   Ethanol, S. < 3  Ethanol % (comp) < 0.003 (Result(s) reported on 06 Feb 2014 at 03:21PM.)  Lipase 169 (Result(s) reported on 06 Feb 2014 at 03:21PM.)  25-Feb-15 11:13   Result Comment Fluid - PATHOLOGIST TO REVIEW SMEAR. COMMENTS  - APPEAR ON REPORT WHEN COMPLETE.  Result(s) reported on 08 Feb 2014 at 12:58PM.  26-Feb-15 14:01   Magnesium, Serum  1.0 (1.8-2.4 THERAPEUTIC RANGE: 4-7 mg/dL TOXIC: > 10 mg/dL  -----------------------)  27-Feb-15 06:33   Ammonia, Plasma  49  (Result(s) reported on 10 Feb 2014 at 07:33AM.)  Glucose, Serum  108  BUN 18  Creatinine (comp) 0.79  Sodium, Serum 143  Potassium, Serum  3.1  Chloride, Serum 103  CO2, Serum  38  Calcium (Total), Serum  8.3  Osmolality (calc) 287  eGFR (African American) >60  eGFR (Non-African American) >60 (eGFR values <15m/min/1.73 m2 may be an indication of chronic kidney disease (CKD). Calculated eGFR is useful in patients with stable renal function. The eGFR calculation will not be reliable in acutely ill patients when serum creatinine is changing rapidly. It is not useful in  patients on dialysis. The eGFR calculation may not be applicable to patients at the low and high extremes of body sizes, pregnant women, and vegetarians.)  Anion Gap  2  Urine Drugs:  [id number]  Tricyclic Antidepressant, Ur Qual (comp) NEGATIVE (Result(s) reported on 06 Feb 2014 at 06:41PM.)  Amphetamines, Urine Qual. NEGATIVE  MDMA, Urine Qual. NEGATIVE  Cocaine Metabolite, Urine Qual. NEGATIVE  Opiate, Urine qual NEGATIVE  Phencyclidine, Urine Qual. NEGATIVE  Cannabinoid, Urine Qual. NEGATIVE  Barbiturates, Urine Qual. NEGATIVE  Benzodiazepine, Urine Qual. NEGATIVE (----------------- The URINE DRUG SCREEN provides only a preliminary, unconfirmed analytical test result and should not be used for non-medical  purposes.  Clinical consideration and professional judgment should be  applied to any positive drug screen result due to possible interfering substances.  A more specific alternate chemical method must be used in order to obtain a confirmed analytical result.  Gas chromatography/mass spectrometry (GC/MS) is the preferred confirmatory method.)  Methadone, Urine Qual. NEGATIVE  Cardiac:  23-Feb-15 14:24   Troponin I 0.03 (0.00-0.05 0.05 ng/mL or less: NEGATIVE  Repeat testing in 3-6 hrs  if clinically indicated. >0.05 ng/mL: POTENTIAL  MYOCARDIAL INJURY. Repeat  testing in 3-6 hrs if   clinically indicated. NOTE: An increase or decrease  of 30% or more on serial  testing suggests a  clinically important change)  Routine UA:  23-Feb-15 18:04   Color (UA) Amber  Clarity (UA) Clear  Glucose (UA) Negative  Bilirubin (UA) Negative  Ketones (UA) Negative  Specific Gravity (UA) 1.011  Blood (UA) Negative  pH (UA) 5.0  Protein (UA) Negative  Nitrite (UA) Negative  Leukocyte Esterase (UA) Negative (Result(s) reported on 06 Feb 2014 at 06:41PM.)  RBC (UA) <1 /HPF  WBC (UA) 1 /HPF  Bacteria (UA) NONE SEEN  Epithelial Cells (UA) <1 /HPF  Mucous (UA) PRESENT  Hyaline Cast (UA) 3 /LPF (Result(s) reported on 06 Feb 2014 at 06:41PM.)  Routine Coag:  23-Feb-15 14:24   Activated PTT (APTT) 35.7 (A HCT value >55% may artifactually increase the APTT. In one study, the increase was an average of 19%. Reference: "Effect on Routine and Special Coagulation Testing Values of Citrate Anticoagulant Adjustment in Patients with High HCT Values." American Journal of Clinical Pathology 2006;126:400-405.)  Prothrombin 14.7  INR 1.2 (INR reference interval applies to patients on anticoagulant therapy. A single INR therapeutic range for coumarins is not optimal for all indications; however, the suggested range for most indications is 2.0 - 3.0. Exceptions to the INR Reference Range may include: Prosthetic heart valves, acute myocardial infarction, prevention of myocardial infarction, and combinations of aspirin and anticoagulant. The need for a higher or lower target INR must be assessed individually. Reference: The Pharmacology and Management of the Vitamin K  antagonists: the seventh ACCP Conference on Antithrombotic and Thrombolytic Therapy. CWSFKC.1275Sept:126 (3suppl): [id number] A HCT value >55% may artifactually increase the PT.  In one study,  the increase was an average of 25%. Reference:  "Effect on Routine and Special Coagulation Testing Values of Citrate Anticoagulant  Adjustment in Patients  with High HCT Values." American Journal of Clinical Pathology 6834;196:222-979.)  Routine Hem:  27-Feb-15 06:33   WBC (CBC)  20.6  RBC (CBC)  2.34  Hemoglobin (CBC)  8.9  Hematocrit (CBC)  27.0  Platelet Count (CBC)  61  MCV  115  MCH  38.1  MCHC 33.0  RDW  14.7  Neutrophil % 90.1  Lymphocyte % 4.7  Monocyte % 4.7  Eosinophil % 0.1  Basophil % 0.4  Neutrophil #  18.5  Lymphocyte # 1.0  Monocyte # 1.0  Eosinophil # 0.0  Basophil # 0.1 (Result(s) reported on 10 Feb 2014 at [facility].)   Radiology Results: MRI:    24-Feb-15 15:56, MRI Brain Without Contrast  MRI Brain Without Contrast   REASON FOR EXAM:    cva  COMMENTS:       PROCEDURE: MR  - MR BRAIN WO CONTRAST  - Feb 07 2014  3:56PM     CLINICAL DATA:  Altered mental status.  Cirrhosis.    EXAM:  MRI HEAD WITHOUT CONTRAST    TECHNIQUE:  Multiplanar, multiecho pulse sequences of the brain and surrounding  structures were obtained without intravenous contrast.    COMPARISON:  02/07/2012 head CT.  10/13/2012 brain MR.  FINDINGS:  No acute infarct.    Scattered punctate and patchy white matter type changes have  progressed slightly since the prior examination. This may reflect  result of small vessel disease. In a patient of this age and gender,  result of demyelinating process cannot be excluded in the proper  clinical setting. Result of vasculitis or inflammatory process felt  to be less likely considerations.    No T1 hyperintensity in the basal ganglia is noted as can be seen in  patients with liver failure.    No intracranial mass lesion noted on this unenhanced exam.  Major intracranial vascular structures are patent with small left  vertebral artery.    Exophthalmos.    Opacification mastoid air cells and middle ear cavities bilaterally  without obstructing lesion noted in the region of the posterior  superior nasopharynx causing eustachian tube dysfunction.    Minimal to mild  paranasal sinus mucosal thickening.     IMPRESSION:  No acute infarct.    Scattered punctate and patchy white matter type changes have  progressed slightly since the prior examination as noted above.    Exophthalmos.    Opacificationmastoid air cells and middle ear cavities bilaterally.    Minimal to mild paranasal sinus mucosal thickening.      Electronically Signed    By: Chauncey Cruel M.D.    On: 02/07/2014 16:45         Verified By: Doug Sou, M.D.,  CT:    23-Feb-15 16:59, CT Head Without Contrast  CT Head Without Contrast   REASON FOR EXAM:    altered mental status  COMMENTS:       PROCEDURE: CT  - CT HEAD WITHOUT CONTRAST  - Feb 06 2014  4:59PM     CLINICAL DATA:  Increased confusion    EXAM:  CT HEAD WITHOUT CONTRAST    TECHNIQUE:  Contiguous axial images were obtained from the base of the skull  through the vertex without intravenous contrast.    COMPARISON:  10/13/2012  FINDINGS:  No skull fracture is noted. Paranasal sinuses and mastoid air cells  are unremarkable.    No intracranial hemorrhage, mass effect or midline shift.    In axial image 22 there is small area  of decreased attenuation in  left frontal lobe white matter measures about 8.4 mm. This may be  due to ischemia of indeterminate age. Clinical correlation is  necessary. If recent ischemia suspected further correlation with MRI  is recommended. No mass lesion is noted on this unenhanced scan.     IMPRESSION:  No intracranial hemorrhage, mass effect or midline shift. In axial  image 22 there is small area of decreased attenuation in left  frontal lobe white matter measures about 8.4 mm. This may be due to  ischemia of indeterminate age. Clinical correlation is necessary. If  recent ischemia suspected further correlation with MRI is  recommended.      Electronically Signed    By: Eston Esters.D.    On: 02/06/2014 17:04         Verified By: Ephraim Hamburger, M.D.,    Impression/Recommendations: Recommendations:   1) Encephalopathy - multifactorial, long standing history of alcoholism with recent cessation, hyperammonemia (hepatic encephalopathy - being treated appropriately), and mild hypoxia, uremia (getting better), long standing protein calorie malnutrition, hypomagnesemia (worsening).hypothyroidism (with exopthalmos)Vit B12 is OKper wife - his confusion is little better than beforeagree with current care including thiamin 100 mg qdayadd multivitamin Noted to have multiple tongue lesions - wife will discuss with hospitalist physician abnormal MRI brain - minimal increase in white matter FLAIR hyperintensities compared to previous MRI brain from 09/2012. Hepatic encephalopathy has been associted with similar changes. 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 to the wife pathophysiology of such change. will check out to moonlighting neurologist.  Electronic Signatures: Ray Church (MD)  (Signed 27-Feb-15 22:57)  Authored: Primary Care Physician, Consult, History of Present Illness, Review of Systems, ALLERGIES, NURSING VITAL SIGNS, Physical Exam-, LAB RESULTS, RADIOLOGY RESULTS, Recommendations   Last Updated: 27-Feb-15 22:57 by Ray Church (MD)

## 2015-04-07 NOTE — Consult Note (Signed)
Details:   - GI follow up  S: still altered. some withdrawl symptoms and given ativan. Sleeping currently.   O: vss cta, no w/c rrr, no m/r/g obese, nt, some distension  Labs:  liver enzymes unchanged today.  T.bili 10  A/P:   1. ) Alcoholic Hepatitis:    unclear if he has underlying cirrhosis in addition or if this is just all alcohol induced liver injury. The AMS is concerning.  - Cont prednisolone 40 mg daily - I am increasing the lactulose to 30 ml q6 hr since only one stool today, need to titrate this to 2 - 3 stools per day.  - no ascites to tap. - daily liver enzymes, INR - cont treating ETOH withdrawal as you are doing.  -  will cont to follow.   Lab Results:  Thyroid:  23-Jan-15 04:23   Thyroid Stimulating Hormone  24.4 (0.45-4.50 (International Unit)  ----------------------- Pregnant patients have  different reference  ranges for TSH:  - - - - - - - - - -  Pregnant, first trimetser:  0.36 - 2.50 uIU/mL)  Hepatic:  23-Jan-15 04:23   Bilirubin, Total  10.0  Alkaline Phosphatase  236 (45-117 NOTE: New Reference Range 11/04/13)  SGPT (ALT)  100  SGOT (AST)  232  Total Protein, Serum  5.9  Albumin, Serum  1.7  Routine Chem:  23-Jan-15 04:23   Iron Binding Capacity (TIBC)  96  Unbound Iron Binding Capacity SEE COMMENT  Iron, Serum 111  Iron Saturation SEE COMMENT  Result Comment - UNABLE TO CALCULATE VALUE DUE TO NON-  - NUMERIC VALUE WITHIN THE CALCULATION.  Result(s) reported on 06 Jan 2014 at 01:02PM.  Ferritin [facility])  1303 (Result(s) reported on 06 Jan 2014 at 01:03PM.)  Glucose, Serum  126  BUN  39  Creatinine (comp) 1.14  Sodium, Serum  121  Potassium, Serum 5.1  Chloride, Serum  89  CO2, Serum 23  Calcium (Total), Serum 8.8  Osmolality (calc) 255  eGFR (African American) >60  eGFR (Non-African American) >60 (eGFR values <41m/min/1.73 m2 may be an indication of chronic kidney disease (CKD). Calculated eGFR is useful in patients with  stable renal function. The eGFR calculation will not be reliable in acutely ill patients when serum creatinine is changing rapidly. It is not useful in  patients on dialysis. The eGFR calculation may not be applicable to patients at the low and high extremes of body sizes, pregnant women, and vegetarians.)  Anion Gap 9  Routine Hem:  23-Jan-15 04:23   WBC (CBC)  17.4  RBC (CBC)  2.02  Hemoglobin (CBC)  8.4  Hematocrit (CBC)  24.9  Platelet Count (CBC)  99  MCV  124  MCH  41.6  MCHC 33.7  RDW  16.5  Bands 8  Segmented Neutrophils 73  Lymphocytes 14  Monocytes 3  Metamyelocyte 2  NRBC 2  Diff Comment 1 ANISOCYTOSIS  Diff Comment 2 POLYCHROMASIA  Diff Comment 3 HYPOCHROMIA  Diff Comment 4 PLTS VARIED IN SIZE  Result(s) reported on 06 Jan 2014 at 08:32AM.   Electronic Signatures: RArther Dames(MD)  (Signed 23-Jan-15 17:31)  Authored: Details, Lab Results   Last Updated: 23-Jan-15 17:31 by RArther Dames(MD)

## 2015-04-07 NOTE — H&P (Signed)
PATIENT NAME:  Troy [name], Troy [name] MR#:  [id number] DATE OF BIRTH:  Nov 21, 1971  DATE OF ADMISSION:  02/22/2014  PRIMARY CARE PROVIDER: Milinda Pointer. Jacqualine Code, MD  EMERGENCY DEPARTMENT REFERRING DOCTOR:  Corinna Capra, MD  CHIEF COMPLAINT: Abdominal pain and abdominal distention.   HISTORY OF PRESENT ILLNESS: The patient is a 44 year old white male who is known to our service and was actually discharged by me on March 4 when he had presented with acute encephalopathy as well as acute respiratory failure and ascites.  He is currently residing at a nursing home and is back brought back with complaints of abdominal pain and worsening abdominal distention. The patient states that abdominal distention has gotten worse over the last 24 hours. He does have a history of alcohol abuse and cirrhosis.  His condition has progressively gotten worse. He has not had any fevers or chills. He is having some bowel movements. He is taking lactulose. He also has some nausea but has not thrown up. He denies any chest pain or shortness of breath. Denies any urinary symptoms.   PAST MEDICAL HISTORY: Significant for: 1.  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  2.  History of ascites requiring paracentesis.  3.  Hypothyroidism.  4.  Hypertension.  5.  Severe malnutrition.  6.  Anemia and thrombocytopenia due to alcohol abuse.  7.  Electrolyte imbalances.  8.  Gout.  9.  New diagnosis of hypothyroidism. Was seen by endocrinology during last admission for elevated TSH.   CURRENT MEDICATIONS: Allopurinol 300 daily, amlodipine 5 daily, iron sulfate 325 one tab by mouth 3 times a day, fish oil 1200 milligrams 1 tab by mouth 3 times a day, furosemide 20 two tabs daily, Klor-Con 20 milliequivalents daily, lactulose 30 milliequivalents every 8 hours, levothyroxine 75 micrograms daily, magnesium oxide 1 tab by mouth twice a day, Protonix 40 daily.  He is supposed to be on prednisone 2 tabs for 7 days, rifaximin 550 one tab by mouth twice daily,  spironolactone 50 one tab by mouth daily, thiamine 100 milligrams daily.   SOCIAL HISTORY: History of drinking heavily. He does not drink anymore. No IV drugs or smoking.   ALLERGIES: None.   PAST SURGICAL HISTORY: Status post left hip replacement.   FAMILY HISTORY: Positive for Crohn's disease.   REVIEW OF SYSTEMS: CONSTITUTIONAL: Denies any fevers. Complains of fatigue, weakness and severe abdominal pain. No weight loss. No weight gain.  EYES: No blurred or double vision, no pain, no redness, no inflammation and no glaucoma.  ENT: No tinnitus, no ear pain, no hearing loss and no seasonal or year-round allergies. No difficulty swallowing.  RESPIRATORY: Denies any cough, wheezing, hemoptysis, COPD or TB.  CARDIOVASCULAR: No chest pain, orthopnea, edema or arrhythmia. No palpitations or syncope.  GASTROINTESTINAL: Complains of nausea and abdominal pain. No hematemesis, no melena, no jaundice and no rectal bleeding.  GENITOURINARY: Denies any dysuria, hematuria, renal calculus or frequency.  ENDOCRINE: Denies any polyuria or nocturia.  Has hypothyroidism.  HEMATOLOGIC AND LYMPHATIC: Anemia and thrombocytopenia.  SKIN: No acne, no rash. No changes in mole, hair or skin.  MUSCULOSKELETAL: Denies any pain in the neck, back or shoulder.  NEUROLOGIC: No numbness, CVA or TIA.  PSYCHIATRIC: No anxiety, insomnia or ADD.   PHYSICAL EXAMINATION:  VITAL SIGNS: Temperature 99.6, pulse 130, respirations 22, blood pressure 108/62, oxygen saturation of 97%.  GENERAL: The patient is an obese male, appears chronically ill.  HEENT: Head atraumatic, normocephalic. Pupils equally round, reactive to light and accommodation.  There is no conjunctival pallor. No scleral icterus. Nasal exam shows no drainage or ulceration.  Oropharynx is clear without any exudate.   NECK: Supple without any JVD.  CARDIOVASCULAR: Regular rate and rhythm. No murmurs, rubs, clicks or gallops.  LUNGS: Clear to auscultation  bilaterally without any rales, rhonchi or wheezing.  ABDOMEN: Severely distended. There is tenderness, but there is no guarding. There is diminished bowel sounds. No hepatosplenomegaly.  EXTREMITIES: He has edema 2+.  Skin is dry, no rash.  LYMPHATICS: No lymph nodes palpable.  VASCULAR: Good DP and PT pulses.  PSYCHIATRIC: Not anxious or depressed.  NEUROLOGIC: Awake, alert and oriented x3. No focal deficits.   LABORATORY DATA: Glucose 111, BUN 8, creatinine 0.72, sodium 136, potassium 4.4, chloride 101, CO2 30, lipase 248. LFTs: Total protein 6.2, albumin 2.6, bilirubin total of 2.3, alkaline phosphatase 196, AST 44, ALT 54. WBC 25.1, Hemoccult 25.1, hemoglobin 9.3, platelet count 113,000. CT scan of the abdomen and pelvis shows marked enlargement of the left hepatic lobe compatible with a history of cirrhosis. There is left abdominal venous colorization fullness in the uncinate process of the pancreas, nonspecific. Body wall edema, moderate ascites, areas of dilated small bowel and areas of nondilated small bowel. The colon was also distended with fluid and air. No transition zone is identified.   ASSESSMENT AND PLAN: The patient is a 44 year old white male with history of liver cirrhosis and hepatic encephalopathy. He presents with abdominal pain and distention.  1.  Abdominal pain and distention likely due to ileus as well as worsening ascites. Surgery has been consulted. At this time, we will place an NG tube with low wall suctioning. We will hold by mouth medications.  2.  History of hepatic encephalopathy. We will hold medications for now due to ileus.  3.  Liver cirrhosis, progressive, prognosis very poor.  4.  Leukocytosis, possibly due to SVP.  I will give him IV Zosyn. We will follow blood cultures.  5.  Hypothyroidism. I will place him on IV Synthroid.  We will check a TSH.  6.  Thrombocytopenia and anemia which is chronic. Monitor these.   CODE STATUS: FULL.   I am going to ask  palliative care to see the patient. He appears to be progressively getting worse. His prognosis is very grim.  NOTE: Time spent on this is a 55 minutes.     ____________________________ Lafonda Mosses Posey Pronto, MD shp:mk D: 02/22/2014 19:45:47 ET T: 02/22/2014 20:05:46 ET JOB#: [id number]  cc: Zyairah Wacha H. Posey Pronto, MD, <Dictator> Alric Seton MD ELECTRONICALLY SIGNED 03/01/2014 8:27

## 2015-04-07 NOTE — Consult Note (Signed)
Chief Complaint:  Subjective/Chief Complaint Had 3 BM's yest. One so far today with lactulose now at q 4hrs. TSH very high. Synthroid ordered. Large tongue sores. Started on antiviral/mouth wash. Also, c/o redness/tearing of the eyes.   VITAL SIGNS/ANCILLARY NOTES: **Vital Signs.:   01-Mar-15 04:51  Vital Signs Type Routine  Temperature Temperature (F) 97.9  Celsius 36.6  Temperature Source oral  Pulse Pulse 91  Respirations Respirations 20  Systolic BP Systolic BP 229  Diastolic BP (mmHg) Diastolic BP (mmHg) 78  Mean BP 93  Pulse Ox % Pulse Ox % 93  Pulse Ox Activity Level  At rest  Oxygen Delivery Non-invasive ventilation (CPAP/BIPAP)   Brief Assessment:  GEN no acute distress   Cardiac Regular   Respiratory clear BS   Gastrointestinal Normal   Lab Results: Lab:  27-Feb-15 15:20   pH (ABG) 7.39  PCO2  64  PO2  74  FiO2 32  Base Excess  11.0  HCO3  38.7  O2 Saturation 97.1  O2 Device CANNULA  Specimen Site (ABG) RT RADIAL  Specimen Type (ABG) ARTERIAL  Patient Temp (ABG) 37.0 (Result(s) reported on 10 Feb 2014 at 03:29PM.)   Assessment/Plan:  Assessment/Plan:  Assessment alcoholic hepatitis on prednisone. Encephalopathy. On high dose lactulose. Signif hypothyroidism may be contributing to overall mental status.   Plan Can back down on lactulose by tomorrow if patient has signif diarrhea today. Dr. Rayann Heman will see patient tomorrow. Thanks.   Electronic Signatures: Verdie Shire (MD)  (Signed 01-Mar-15 11:59)  Authored: Chief Complaint, VITAL SIGNS/ANCILLARY NOTES, Brief Assessment, Lab Results, Assessment/Plan   Last Updated: 01-Mar-15 11:59 by Verdie Shire (MD)

## 2015-04-07 NOTE — Consult Note (Signed)
Psychiatry: Patient continues to be very confused and intermittantly delirious. far as treatment for alcohol dependence patient is still very far off from being able to participate in any kind of therapy. have asked the social work service to assist with providing information tothe family about longer term SA programs.check in again next week. Please call if there is any other need for my services sooner.  Electronic Signatures: Gonzella Lex (MD)  (Signed on 27-Feb-15 16:51)  Authored  Last Updated: 27-Feb-15 16:51 by Gonzella Lex (MD)

## 2015-04-07 NOTE — Consult Note (Signed)
PATIENT NAME:  Troy [name], Troy [name] MR#:  [id number] DATE OF BIRTH:  Sep 03, 1971  DATE OF CONSULTATION:  02/13/2014  REFERRING PHYSICIAN:  Vivien Presto, MD CONSULTING PHYSICIAN:  A. Lavone Orn, MD  PRIMARY CARE PHYSICIAN: Milinda Pointer. Moffett, MD  CHIEF COMPLAINT: Elevated TSH.   HISTORY OF PRESENT ILLNESS: This is a 44 year old male with a history of alcoholic cirrhosis, admitted on 02/06/2014 with altered mental status attributed to hepatic encephalopathy. He was also hospitalized at this facility in late January, during which time he was found to have an elevated TSH of 24.4. He had no known prior history of hypothyroidism. He was initiated on levothyroxine 75 mcg per day. At hospital discharge, he was sent to [facility]. I did review the MAR from [facility] and confirmed that levothyroxine 75 mcg per day was continued. Repeat labs on 02/12/2014 showed his TSH was 60. He may have missed a dose of levothyroxine on February 24 and February 25 as none were documented as being given here. The patient denies cold intolerance. He denies neck pain. He does report dry skin. He reports a weight gain of about 25 pounds in the last 6 weeks. Appetite is fair. He denies nausea. He also notes voice has gotten deeper in the last few weeks as well.   PAST MEDICAL HISTORY:  1. Alcoholic cirrhosis.  2. Hepatic encephalopathy.  3. Hypertension.  4. Gout.  5. Hypothyroidism.   CURRENT MEDICATIONS:  1. Levothyroxine was 75 mcg per day, increased to 112 mcg per day today.  2. Amlodipine 5 mg daily.  3. Docusate 100 mg b.i.d.  4. Ferrous sulfate 325 mg daily.  5. Allegra 180 mg daily.  6. Furosemide 40 mg b.i.d.  7. Lactulose 10 grams/15 mL syrup 30 mL q.8 hours.  8. Levaquin 750 mg q.24 hours.  9. Linezolid 600 mg q.12 hours.  10. Magnesium oxide 400 mg b.i.d.  11. Potassium chloride ER 20 mEq b.i.d.  12. Prednisolone 15 mg/5 mL syrup 30 mg p.o. daily.  13. Xifaxan 550 mg b.i.d.  14.  Thiamine 100 mg daily.  15. Heparin 5000 units q.8 hours.   ALLERGIES: No known drug allergies.   SOCIAL HISTORY: The patient is married. He has no biologic children. Wife has 2 biologic children that live with them. He does not smoke cigarettes, quit in 2005. History of heavy alcohol use and reported last drink was 01/05/2014.   FAMILY HISTORY: No known thyroid disease.   REVIEW OF SYSTEMS:  GENERAL: Weight gain as per HPI. No fever.  HEENT: Denies blurred vision. Denies sore throat.  NECK: Denies neck pain or dysphasia.  CARDIAC: Denies chest pain or palpitation.  PULMONARY: Denies cough or shortness of breath.  ABDOMEN: Reports abdominal distention. Reports fair appetite. Denies nausea.  EXTREMITIES: Reports leg swelling.  ENDOCRINE: Denies heat or cold intolerance.  GENITOURINARY: Denies dysuria or hematuria.  NEUROLOGIC: No recent falls. No headache.   PHYSICAL EXAMINATION:  VITAL SIGNS: Height 67.9 inches, weight 285 pounds, BMI 43.4. Temperature 98.3, pulse 87, respirations 18, blood pressure 120/77, pulse oximetry 96% on 2 liters O2 by nasal cannula.  GENERAL: Morbidly obese white male in no acute distress.  HEENT: EOMI. No periorbital edema. Oropharynx is clear. Mucous membranes moist.  NECK: No thyromegaly is present. No palpable thyroid nodules.  LYMPHATIC: No submandibular or anterior cervical lymphadenopathy.  CARDIAC: Regular rate and rhythm. No carotid bruit.  PULMONARY: Clear to auscultation bilaterally. No wheeze. Good inspiratory effort.  ABDOMEN: Distended. Positive bowel  sounds. Soft.  EXTREMITIES: Pitting edema to the knees is present bilaterally.  SKIN: Old ecchymotic lesions on the extremities, no acute rash, no scleral icterus or jaundice. Diffusely severely dry.  LABORATORY DATA: Glucose 93, BUN 15, creatinine 0.7, sodium 141, potassium 3.1. Lipase 7.9. Ammonia 1.2. TSH 60.4. Hematocrit 25.6, WBC 16.3, platelets 115.   ASSESSMENT: A 44 year old male with  alcoholic cirrhosis, hepatic encephalopathy, recently diagnosed with hypothyroidism. Despite initiation of levothyroxine 75 mcg per day on January 23, the patient has had worsening of his TSH level, and now it is up to 60. Hypothyroidism is severely uncontrolled. Certainly, hypothyroidism could be contributing to slowed mentation, although would not explain degree of mental status changes present on admission, which is rather due to his hepatic encephalopathy. High TSH indicates that he is not adequately absorbing the levothyroxine and also may not be getting a sufficient dose.   RECOMMENDATIONS:  1. Will increase levothyroxine to 150 mcg daily.  2. Ensure the patient gets levothyroxine each day at the same time, give on an empty stomach and weight 60 minutes before having anything to eat or drink other than water.  3. I will order an outpatient followup with me in 4 to 6 weeks to continue management of hypothyroidism.   Thank you for the kind request for consultation.   ____________________________ A. Lavone Orn, MD ams:lb D: 02/13/2014 17:37:55 ET T: 02/14/2014 06:08:17 ET JOB#: [id number]  cc: A. Lavone Orn, MD, <Dictator> Sherlon Handing MD ELECTRONICALLY SIGNED 02/20/2014 21:07

## 2015-04-07 NOTE — Consult Note (Signed)
PATIENT NAME:  Troy, [name] MR#:  [id number] DATE OF BIRTH:  [birth year]  DATE OF CONSULTATION:  01/16/2014  REFERRING PHYSICIAN:  Vivien Presto, MD CONSULTING PHYSICIAN:  Cheral Marker. Ola Spurr, MD  REASON FOR CONSULTATION: Leukocytosis.   HISTORY OF PRESENT ILLNESS: This is a 44 year old gentleman with history of heavy alcohol abuse admitted on January 22nd with weakness, jaundice, and falls. He has been a heavy drinker for many years. He was noted to have elevated bilirubin and hyponatremia. White blood count on admission was elevated at 16.4.   The patient was seen by GI and had work-up for his liver disease. He was also started on prednisolone for alcoholic hepatitis. Ultrasound showed cirrhosis, but not much ascites. He was also placed on CIWA protocol. He was also treated with lactulose for his hepatic encephalopathy and did have diarrhea but C. diff was negative. The patient really has not had any fevers and has clinically improved. He received Rocephin from January 23rd until January 29th. Blood cultures and other infectious disease work-up for negative. However, his white count has increased and remained persistently elevated. We are consulted for further evaluation.   Currently, the patient reports that he is feeling a significant amount better. It is noted he has quite slowed mentation when I speak with him. He denies any headache, sinus congestion, sore throat, dysphagia, chest pain, shortness of breath, or cough. He has had no abdominal pain, specifically no right upper quadrant abdominal pain. He has no bed sores or joint pain or swelling.  PAST MEDICAL HISTORY: 1.  Alcoholic cirrhosis and acute alcohol hepatitis as above.  2.  Hypertension.  3.  Gout.   PAST SURGICAL HISTORY: Status post left hip replacement.   ALLERGIES: None.  ANTIBIOTICS: Ceftriaxone as above.   ADDITIONAL MEDICATIONS: Currently include allopurinol 300 once a day, methylprednisolone 40 mg once a day,  morphine, Zofran, pantoprazole, Lasix, levothyroxine, alprazolam, potassium chloride, and lactulose.  ALLERGIES: No known drug allergies.   PHYSICAL EXAMINATION: VITAL SIGNS: T-max 98.1. The patient has not had any fevers over the last several days. Pulse is 108, blood pressure 112/67, respirations 20, and sat 91% on room air.  GENERAL: He is obese, lying in bed. He has quite slow mentation. He is jaundiced.  HEENT: Pupils equal, round, and reactive to light and accommodation. Extraocular movements are intact. Sclerae are icteric. Oropharynx is somewhat dry, but no oral lesions.  NECK: Supple.  HEART: Regular with no murmurs, rubs, or gallops.  LUNGS: Clear to auscultation bilaterally.  ABDOMEN: Soft, obese, and distended but nontender. There is no right upper quadrant pain  prominent veins  EXTREMITIES: 1+ edema bilaterally.  NEUROLOGIC: He has slowed mentation. He is moving all 4 extremities. He does have a positive mild asterixis.   LABORATORY AND DIAGNOSTICS: White blood count 29.2, down from 32 yesterday. Hemoglobin 8.8 and platelets 109. Diff has a neutrophil count of 24,000. INR is 1.3. Blood culture on 01/29 negative. C. diff on 01/31 negative. Urinalysis done 01/24 with 16 white cells. Creatinine 0.97 on 02/01. LFTs show T-bili 9.9, alk phos 189, AST 155, ALT 125.   Imaging: Ultrasound guided paracentesis on 01/29 revealed no evidence for abdominal ascites, so it was not done. Chest x-ray on 01/29 showed no active disease. CT for PE protocol on 01/23 showed no evidence of a PE. There was borderline cardiomegaly. An ultrasound of the abdomen on 01/22 showed the liver is diffusely heterogeneous without focal mass. There is trace free fluid in the lower abdomen  bilaterally. There was limited visualization of the gallbladder and no evidence of gallstones.   IMPRESSION: A 44 year old gentleman with a history of heavy alcohol use here with alcoholic hepatitis, but also noted to have persistent  and increased leukocytosis, up to 30. Blood cultures, chest x-ray, and Clostridium difficile have been negative. He has received approximately 5 to 6 days of ceftriaxone. He has had no fevers and is hemodynamically stable.   I suspect his leukocytosis is a stress response to the alcoholic hepatitis and the inflammation in his liver. One other possibility would be biliary process given his elevated alk phos, but his ultrasound done on admission did not show any evidence of gallstones or cholecystitis. He is not having any abdominal pain in the area. Cultures have been negative.   RECOMMENDATIONS: 1.  Continue off of antibiotics.  2.  Monitor for fevers or change in status. If he does have a fever would suggest repeat blood cultures, urinalysis, and urine culture.  3.  Steroids are likely also contributing to the elevated white count and can wean as appropriate, per gastroenterology.  Thank you for the consult. I will be glad to follow with you.  ____________________________ Cheral Marker. Ola Spurr, MD dpf:sb D: 01/16/2014 15:48:44 ET T: 01/16/2014 16:11:08 ET JOB#: [id number]  cc: Cheral Marker. Ola Spurr, MD, <Dictator> Brandilynn Taormina Ola Spurr MD ELECTRONICALLY SIGNED 01/16/2014 21:39

## 2015-04-07 NOTE — Discharge Summary (Signed)
PATIENT NAME:  GIL, INGWERSEN MR#:  [id number] DATE OF BIRTH:  1971/03/12  DATE OF ADMISSION:  02/22/2014 DATE OF DISCHARGE:  03/01/2014  ADDENDUM SUMMARY  For full H and P and discharge summary, please see the admission H and P and discharge summary dictated on March 17.   Since March 17, the patient has been doing well. There have been no acute issues. He does have a bed at the rehab facility and he has been working with physical therapy. He states that he has stood up for about a couple of times and feels stronger.   At this point, he will be discharged to [facility]. He has no significant lower extremity edema and his abdomen is less distended. He is mentally back to his baseline.   TOTAL TIME SPENT: 33 minutes.   CODE STATUS: FULL CODE.   ____________________________ Vivien Presto, MD sa:np D: 03/01/2014 15:08:09 ET T: 03/01/2014 15:39:37 ET JOB#: [id number]  cc: Vivien Presto, MD, <Dictator> Vivien Presto MD ELECTRONICALLY SIGNED 03/16/2014 15:48

## 2015-04-07 NOTE — Consult Note (Signed)
PATIENT NAME:  Troy [name], Troy [name] MR#:  [id number] DATE OF BIRTH:  1971-09-02  DATE OF CONSULTATION:  01/05/2014  REFERRING PHYSICIAN:  Valli Glance. Owens Shark, [place]:  Corky Sox. Meredeth Ide, for Arther Dames, MD ATTENDING GASTROENTEROLOGIST: Arther Dames, MD  REASON FOR CONSULTATION: Elevated liver enzymes and possible underlying alcohol-induced hepatitis.   HISTORY OF PRESENT ILLNESS: This is a pleasant 44 year old gentleman accompanied by his wife, who is still in a room in the ER, who initially presented to the hospital with concerns of progressive weakness. The patient does have a history of chronic daily alcohol use and he states that his last drink was Tuesday night 01/03/2014. Over the past 2 to 3 weeks, the patient has been noticing that he is becoming more and more unsteady on his feet and has not felt comfortable walking about his house, even just to get up to the bathroom, without some assistance because he is afraid of falling. His appetite has been somewhat diminished. He has also noticed increased abdominal girth periodically over the past few weeks, and over the past few days, he has been complaining of constant hiccups. There is no nausea or vomiting. No significant diarrhea or constipation. He has noticed trace blood per rectum in the past and is up-to-date on colonoscopies. Over the past week or so, the patient's wife states that the patient has been extremely confused and has been saying "weird things" and acting very strange. Upon presentation to the ER, labs were checked and he does have markedly elevated liver enzymes and a lipase level. He is also hyponatremic. Abdominal ultrasound reveals diffusely heterogeneous liver measuring 18.2 cm without a focal mass. No fever or chills. No chest pain or shortness of breath. No known history of hepatitis that he is aware of.   PAST MEDICAL HISTORY: Gout, alcohol use and abuse, hypertension, dyslipidemia.   PAST SURGICAL HISTORY:  Testicular torsion, left total hip replacement, perirectal abscess surgery.   ALLERGIES: No known drug allergies.   SOCIAL HISTORY: The patient does report heavy alcohol use on nearly a daily basis. Last sip of alcohol was Tuesday night, January 20.   FAMILY HISTORY: Mom has had Crohn's disease. No known liver disease, cirrhosis, alcoholism or IBD other than his mom that he knows of. No colon cancer.   REVIEW OF SYSTEMS: A 10-system review of systems was obtained on the patient. For a short while yesterday, he was complaining of shortness of breath, and an echocardiogram has been ordered. All other pertinent positives are mentioned above and otherwise negative.   OBJECTIVE: VITAL SIGNS: Stable at the present moment.  GENERAL: This is a pleasant 44 year old gentleman, accompanied by his wife at bedside in the Emergency Room, in no acute distress. He is alert and oriented but is acting somewhat strange and does seem to be very confused when I am trying to gather the history.  HEAD: Atraumatic, normocephalic.  NECK: Supple. No lymphadenopathy noted.  HEENT: Sclerae anicteric. Mucous membranes moist.  LUNGS: Respirations are even and unlabored. Clear to auscultation bilateral anterior lung fields.  CARDIAC: Regular rate and rhythm. S1, S2 noted.  ABDOMEN: Soft, significantly distended with a fluid wave, nontender to palpation. Normoactive bowel sounds noted in all 4 quadrants. Exam of the abdomen is significantly limited due to a profound amount of distention and likely ascites.  RECTAL: Deferred.  PSYCHIATRIC: Appropriate mood and affect, though he is acting somewhat strange and confused.  EXTREMITIES: Negative for lower extremity edema, 2+ pulses noted in bilateral upper  extremities.   LABORATORY DATA: White blood cells 16.4, hemoglobin 9.7, hematocrit 29.1, platelets 118, MCV 124. Sodium 121, potassium 4.7, BUN 33, creatinine 1.06, glucose 110. Hepatitis A, B, and C are pending. Troponin  negative. Bilirubin 10.7, alkaline phosphatase 278, AST 267, ALT 119, albumin 2.0. PT 15.5, INR 1.3. Lipase 808. Serum alcohol is negative. Folic acid 2.6. D-dimer 1.78.   IMAGING: Chest x-ray was obtained showing borderline cardiomegaly, but otherwise no acute process.   Ultrasound of the abdomen was obtained showing a diffusely heterogeneous liver measuring 18.2 cm. No focal liver mass. No signs of gallstones. There was trace free fluid in the lower abdomen, though this was a limited ultrasound.   Urine drug screen is pending.   Echocardiogram is pending.   ASSESSMENT: 1.  Alcohol-induced hepatitis.  2.  Altered mental status, likely underlying hepatic encephalopathy.  3.  Alcohol use and abuse.  4.  Elevated liver enzymes.  5.  Hyponatremia.  6.  Anemia of chronic disease, likely secondary to chronic liver disease.  7.  Abdominal distention/ascites.   PLAN: I have discussed this patient's case in detail with Dr. Arther Dames, who is involved in the development of the patient's plan of care. After reviewing the patient's labs and ultrasound and speaking with the patient, getting history from both him and his wife, this does appear to be an alcohol-induced hepatitis. His calculated Maddrey's discriminant score is 36, based on today's labs. His MELD score is 19. Therefore, we will start him on IV prednisolone 40 mg daily. Because of his altered mental status, I am concerned that he is encephalopathic and therefore, I will check an ammonia level. We will start him on lactulose 30 mg 3 times a day, but we recommend titrating to reach a goal of 2 to 3 bowel movements per day. Because of his chronic alcohol use, we do recommend following a withdrawal protocol and watch for signs of him being in withdrawal. Due to his significant abdominal distention, we will also order a diagnostic paracentesis and send the fluid for analysis. Continue to monitor his labs closely, correct electrolytes, and will also  await the remaining labs including hepatitis panel and urine drug screen. Echocardiogram is scheduled for later on today as well. We stressed the importance and encouraged alcohol cessation, although the patient does not seem all that interested in quitting. We will continue to monitor this patient closely and make further recommendations per clinical course and pending the above results. Of note, he has had an EGD and a colonoscopy most recently in 2012 for a history of Barrett's esophagus and colon polyps. He is due for a repeat colonoscopy in May 2015. There have not been any findings of esophageal varices or portal hypertension on prior EGDs. All questions were answered. We will continue to follow this patient along with you.   Thank you so much for this consultation and for allowing [place] to participate in the patient's plan of care.   ATTENDING GASTROENTEROLOGIST: Dr. Rayann Heman.    ____________________________ Corky Sox. Alexandr Oehler, PA-C kme:jcm D: 01/05/2014 16:15:49 ET T: 01/05/2014 16:38:23 ET JOB#: [id number]  cc: Corky Sox. Orlinda Slomski, PA-C, <Dictator> [place] PA ELECTRONICALLY SIGNED 01/06/2014 8:29

## 2015-04-15 NOTE — Consult Note (Signed)
PATIENT NAME:  Troy [name], Troy [name] MR#:  [id number] DATE OF BIRTH:  24-Sep-1971  DATE OF CONSULTATION:  01/13/2015  REFERRING PHYSICIAN:   CONSULTING PHYSICIAN:  Analis Distler K. Franchot Mimes, MD  PLACE OF DICTATION: [facility], [place], [place].   AGE: 44.   SEX: Male.   RACE: White.   SUBJECTIVE: The patient was seen in consultation in [facility] Emergency Room. The patient is a 44 year old white male who is currently employed as a Oceanographer. The patient is married for a second time and lives with his wife. The patient has a long history of alcohol dependence and he has been sober for some time. He started drinking again in November 2015 at the rate of a pint a day of hard liquor. The patient decided to get help for the same. His last drink of alcohol was on Thursday, 01/11/2015, when he decided to get help and came to [facility] for the same.  The patient reports that he had 1 DWI in December 2016 and he lost his driver's license for 30 days and got it back. Never arrested for public drunkenness. Denies any other street or prescription drug abuse. Denies smoking, nicotine, cigarettes.   MENTAL STATUS: The patient is seen lying in bed, alert and oriented, calm, pleasant, cooperative. No agitation. Affect and mood stable. Denies feeling depressed. Says he decided to quit drinking alcohol one again. Denies feeling hopeless or helpless. Denies feeling worthless or useless. Denies suicidal or homicidal plans and contracts for safety. No psychosis. Does not appear to be responding to internal stimuli. Cognition intact. General fund of information fair. Insight and judgment guarded. Impulse control is fair.   IMPRESSION:  1.  Alcohol dependence, chronic, continuous.  2.  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   RECOMMENDATIONS: Continue detoxification and await placement at an appropriate place. Social services contact [place] and hopefully he will be accepted for help  for his substance abuse and alcohol drinking problems.    ____________________________ Wallace Cullens. Franchot Mimes, MD skc:at D: 01/13/2015 13:23:59 ET T: 01/13/2015 16:33:32 ET JOB#: [id number]  cc: Arlyn Leak K. Franchot Mimes, MD, <Dictator> Dewain Penning MD ELECTRONICALLY SIGNED 01/14/2015 14:12

## 2015-05-13 ENCOUNTER — Emergency Department: Payer: 59

## 2015-05-13 ENCOUNTER — Inpatient Hospital Stay
Admission: EM | Admit: 2015-05-13 | Discharge: 2015-05-17 | DRG: 872 | Disposition: A | Discharge status: Home / Self Care | Payer: 59 | Attending: Internal Medicine | Admitting: Internal Medicine

## 2015-05-13 ENCOUNTER — Encounter: Payer: Self-pay | Admitting: Emergency Medicine

## 2015-05-13 DIAGNOSIS — K7031 Alcoholic cirrhosis of liver with ascites: Secondary | ICD-10-CM | POA: Diagnosis present

## 2015-05-13 DIAGNOSIS — R569 Unspecified convulsions: Secondary | ICD-10-CM | POA: Diagnosis present

## 2015-05-13 DIAGNOSIS — I10 Essential (primary) hypertension: Secondary | ICD-10-CM | POA: Diagnosis present

## 2015-05-13 DIAGNOSIS — Z87891 Personal history of nicotine dependence: Secondary | ICD-10-CM | POA: Diagnosis not present

## 2015-05-13 DIAGNOSIS — D72829 Elevated white blood cell count, unspecified: Secondary | ICD-10-CM | POA: Diagnosis present

## 2015-05-13 DIAGNOSIS — Z8489 Family history of other specified conditions: Secondary | ICD-10-CM | POA: Diagnosis not present

## 2015-05-13 DIAGNOSIS — F10239 Alcohol dependence with withdrawal, unspecified: Secondary | ICD-10-CM | POA: Diagnosis present

## 2015-05-13 DIAGNOSIS — A419 Sepsis, unspecified organism: Secondary | ICD-10-CM | POA: Diagnosis present

## 2015-05-13 DIAGNOSIS — F10939 Alcohol use, unspecified with withdrawal, unspecified: Secondary | ICD-10-CM | POA: Diagnosis present

## 2015-05-13 DIAGNOSIS — E872 Acidosis, unspecified: Secondary | ICD-10-CM | POA: Diagnosis present

## 2015-05-13 DIAGNOSIS — R739 Hyperglycemia, unspecified: Secondary | ICD-10-CM | POA: Diagnosis present

## 2015-05-13 DIAGNOSIS — E871 Hypo-osmolality and hyponatremia: Secondary | ICD-10-CM | POA: Diagnosis present

## 2015-05-13 DIAGNOSIS — E039 Hypothyroidism, unspecified: Secondary | ICD-10-CM | POA: Diagnosis present

## 2015-05-13 DIAGNOSIS — E876 Hypokalemia: Secondary | ICD-10-CM | POA: Diagnosis present

## 2015-05-13 DIAGNOSIS — Z96642 Presence of left artificial hip joint: Secondary | ICD-10-CM | POA: Diagnosis present

## 2015-05-13 DIAGNOSIS — R140 Abdominal distension (gaseous): Secondary | ICD-10-CM

## 2015-05-13 HISTORY — DX: Liver disease, unspecified: K76.9

## 2015-05-13 HISTORY — DX: Polyneuropathy, unspecified: G62.9

## 2015-05-13 HISTORY — DX: Alcohol use, unspecified with withdrawal delirium: F10.931

## 2015-05-13 HISTORY — DX: Alcohol dependence with withdrawal delirium: F10.231

## 2015-05-13 HISTORY — DX: Alcoholic cirrhosis of liver with ascites: K70.31

## 2015-05-13 HISTORY — DX: Essential (primary) hypertension: I10

## 2015-05-13 HISTORY — DX: Other pancytopenia: D61.818

## 2015-05-13 HISTORY — DX: Unspecified convulsions: R56.9

## 2015-05-13 HISTORY — DX: Hypothyroidism, unspecified: E03.9

## 2015-05-13 LAB — SALICYLATE LEVEL: Salicylate Lvl: <4 mg/dL (ref 2.8–30.0)

## 2015-05-13 LAB — CBC WITH DIFFERENTIAL/PLATELET
Basophils Absolute: 0.1 10*3/uL (ref 0–0.1)
Basophils Relative: 0 %
EOS ABS: 0 10*3/uL (ref 0–0.7)
Eosinophils Relative: 0 %
HCT: 31.6 % — ABNORMAL LOW (ref 40.0–52.0)
Hemoglobin: 10.5 g/dL — ABNORMAL LOW (ref 13.0–18.0)
LYMPHS ABS: 0.7 10*3/uL — AB (ref 1.0–3.6)
Lymphocytes Relative: 3 %
MCH: 31.9 pg (ref 26.0–34.0)
MCHC: 33.2 g/dL (ref 32.0–36.0)
MCV: 96.2 fL (ref 80.0–100.0)
MONOS PCT: 2 %
Monocytes Absolute: 0.7 10*3/uL (ref 0.2–1.0)
NEUTROS ABS: 27.1 10*3/uL — AB (ref 1.4–6.5)
Neutrophils Relative %: 95 %
PLATELETS: 161 10*3/uL (ref 150–440)
RBC: 3.29 MIL/uL — ABNORMAL LOW (ref 4.40–5.90)
RDW: 17.7 % — AB (ref 11.5–14.5)
WBC: 28.6 10*3/uL — ABNORMAL HIGH (ref 3.8–10.6)

## 2015-05-13 LAB — COMPREHENSIVE METABOLIC PANEL
ALT: 17 U/L (ref 17–63)
AST: 58 U/L — AB (ref 15–41)
Albumin: 2.1 g/dL — ABNORMAL LOW (ref 3.5–5.0)
Alkaline Phosphatase: 303 U/L — ABNORMAL HIGH (ref 38–126)
Anion gap: 21 — ABNORMAL HIGH (ref 5–15)
BUN: 15 mg/dL (ref 6–20)
CO2: 20 mmol/L — ABNORMAL LOW (ref 22–32)
CREATININE: 0.73 mg/dL (ref 0.61–1.24)
Calcium: 7 mg/dL — ABNORMAL LOW (ref 8.9–10.3)
Chloride: 89 mmol/L — ABNORMAL LOW (ref 101–111)
GFR calc non Af Amer: >60 mL/min (ref >60)
GLUCOSE: 232 mg/dL — AB (ref 65–99)
Potassium: 3 mmol/L — ABNORMAL LOW (ref 3.5–5.1)
Sodium: 130 mmol/L — ABNORMAL LOW (ref 135–145)
TOTAL PROTEIN: 5.7 g/dL — AB (ref 6.5–8.1)
Total Bilirubin: 4 mg/dL — ABNORMAL HIGH (ref 0.3–1.2)

## 2015-05-13 LAB — LACTIC ACID, PLASMA
LACTIC ACID, VENOUS: 2.6 mmol/L — AB (ref 0.5–2.0)
Lactic Acid, Venous: 8.9 mmol/L (ref 0.5–2.0)

## 2015-05-13 LAB — PHOSPHORUS: PHOSPHORUS: 3.3 mg/dL (ref 2.5–4.6)

## 2015-05-13 LAB — PROTIME-INR
INR: 1.23
PROTHROMBIN TIME: 15.7 s — AB (ref 11.4–15.0)

## 2015-05-13 LAB — MAGNESIUM: Magnesium: 1.4 mg/dL — ABNORMAL LOW (ref 1.7–2.4)

## 2015-05-13 LAB — AMMONIA: Ammonia: 74 umol/L — ABNORMAL HIGH (ref 9–35)

## 2015-05-13 LAB — ETHANOL: Alcohol, Ethyl (B): 7 mg/dL — ABNORMAL HIGH (ref <5)

## 2015-05-13 LAB — GLUCOSE, CAPILLARY: Glucose-Capillary: 118 mg/dL — ABNORMAL HIGH (ref 65–99)

## 2015-05-13 LAB — ACETAMINOPHEN LEVEL

## 2015-05-13 LAB — TROPONIN I: TROPONIN I: 0.04 ng/mL — AB (ref <0.031)

## 2015-05-13 LAB — LIPASE, BLOOD: Lipase: 36 U/L (ref 22–51)

## 2015-05-13 LAB — APTT: aPTT: 31 seconds (ref 24–36)

## 2015-05-13 MED ORDER — ONDANSETRON HCL 4 MG PO TABS: 4.0000 mg | ORAL_TABLET | Freq: Four times a day (QID) | ORAL | Status: DC | PRN

## 2015-05-13 MED ORDER — LORAZEPAM 2 MG/ML IJ SOLN
2.0000 mg | Freq: Once | INTRAMUSCULAR | Status: AC
  Administered 2015-05-13: 2 mg via INTRAVENOUS

## 2015-05-13 MED ORDER — INSULIN ASPART 100 UNIT/ML ~~LOC~~ SOLN
0.0000 [IU] | Freq: Three times a day (TID) | SUBCUTANEOUS | Status: DC
  Administered 2015-05-14: 1 [IU] via SUBCUTANEOUS

## 2015-05-13 MED ORDER — CEFTRIAXONE SODIUM IN DEXTROSE 40 MG/ML IV SOLN
2.0000 g | INTRAVENOUS | Status: AC
  Administered 2015-05-13: 2 g via INTRAVENOUS
  Filled 2015-05-13: qty 50

## 2015-05-13 MED ORDER — LORAZEPAM 2 MG/ML IJ SOLN: 2.0000 mg | INTRAMUSCULAR | Status: DC | PRN

## 2015-05-13 MED ORDER — VANCOMYCIN HCL 10 G IV SOLR
2000.0000 mg | Freq: Once | INTRAVENOUS | Status: AC
  Administered 2015-05-13: 2000 mg via INTRAVENOUS
  Filled 2015-05-13: qty 2000

## 2015-05-13 MED ORDER — INSULIN ASPART 100 UNIT/ML ~~LOC~~ SOLN
0.0000 [IU] | Freq: Every day | SUBCUTANEOUS | Status: DC
  Filled 2015-05-13: qty 1

## 2015-05-13 MED ORDER — THIAMINE HCL 100 MG/ML IJ SOLN
Freq: Once | INTRAVENOUS | Status: AC
  Administered 2015-05-13: 23:00:00 via INTRAVENOUS
  Filled 2015-05-13: qty 1000

## 2015-05-13 MED ORDER — SODIUM CHLORIDE 0.9 % IV BOLUS (SEPSIS)
1000.0000 mL | INTRAVENOUS | Status: AC
  Administered 2015-05-13: 1000 mL via INTRAVENOUS

## 2015-05-13 MED ORDER — LACTULOSE 10 GM/15ML PO SOLN
30.0000 g | Freq: Two times a day (BID) | ORAL | Status: DC
  Administered 2015-05-13 – 2015-05-15 (×4): 30 g via ORAL
  Filled 2015-05-13 (×5): qty 60

## 2015-05-13 MED ORDER — POTASSIUM CHLORIDE 10 MEQ/100ML IV SOLN
10.0000 meq | INTRAVENOUS | Status: AC
  Administered 2015-05-13 (×4): 10 meq via INTRAVENOUS
  Filled 2015-05-13 (×4): qty 100

## 2015-05-13 MED ORDER — LORAZEPAM 2 MG/ML IJ SOLN
INTRAMUSCULAR | Status: AC
  Filled 2015-05-13: qty 1

## 2015-05-13 MED ORDER — DEXMEDETOMIDINE HCL IN NACL 400 MCG/100ML IV SOLN: 0.4000 ug/kg/h | INTRAVENOUS | Status: DC

## 2015-05-13 MED ORDER — ONDANSETRON HCL 4 MG/2ML IJ SOLN: 4.0000 mg | Freq: Four times a day (QID) | INTRAMUSCULAR | Status: DC | PRN

## 2015-05-13 NOTE — ED Notes (Signed)
Per ICU request, pt to be taken to floor after 1900. ED Charge RN approved.

## 2015-05-13 NOTE — ED Provider Notes (Signed)
[facility] Emergency Department Provider Note  ____________________________________________  Time seen: Approximately 2:59 PM  I have reviewed the triage vital signs and the nursing notes.   HISTORY  Chief Complaint Weakness    HPI Troy [name] is a 44 y.o. male with a complicated medical history as a result of severe alcohol abuse and resulting liver damage.  He has had multiple admissions for complicated withdrawal including seizures and delirium tremens.  He presents today with generalized weakness that is been getting worse for several days.  He reports he is unable to support himself.  He denies chest pain, shortness of breath, abdominal pain,, dysuria, and respiratory symptoms.  He denies fever   Past Medical History  Diagnosis Date  . Liver damage   . Hypertension   . Neuropathy   . Seizures     complicated ETOH withdrawal  . Pancytopenia, acquired   . Ascites due to alcoholic cirrhosis   . DTs (delirium tremens)     There are no active problems to display for this patient.   History reviewed. No pertinent past surgical history.  No current outpatient prescriptions on file.  Allergies Review of patient's allergies indicates no known allergies.  History reviewed. No pertinent family history.  Social History History  Substance Use Topics  . Smoking status: Former Research scientist (life sciences)  . Smokeless tobacco: Not on file  . Alcohol Use: Yes     Comment: hx of chronic ETOH abuse    Review of Systems Constitutional: No fever/chills Eyes: No visual changes. ENT: No sore throat. Cardiovascular: Denies chest pain. Respiratory: Denies shortness of breath. Gastrointestinal: No abdominal pain.  No nausea, no vomiting.  No diarrhea.  No constipation. chronic ascites, no worse than usual. Genitourinary: Negative for dysuria. Musculoskeletal: Negative for back pain. Skin: Negative for rash. Neurological: generalized weakness  10-point ROS otherwise  negative.  ____________________________________________   PHYSICAL EXAM:  VITAL SIGNS: ED Triage Vitals  Enc Vitals Group     BP 05/13/15 1432 127/91 mmHg     Pulse Rate 05/13/15 1432 130     Resp 05/13/15 1432 20     Temp 05/13/15 1432 97.4 F (36.3 C)     Temp Source 05/13/15 1432 Oral     SpO2 05/13/15 1432 100 %     Weight 05/13/15 1432 238 lb 1.6 oz (108.001 kg)     Height 05/13/15 1432 5\' 7"  (1.702 m)     Head Cir --      Peak Flow --      Pain Score --      Pain Loc --      Pain Edu? --      Excl. in [place]? --     Constitutional: Alert and oriented. Disheveled but in no acute distress Eyes: Conjunctivae are normal. PERRL. EOMI. no scleral icterus Head: Atraumatic. Nose: No congestion/rhinnorhea. Mouth/Throat: Mucous membranes are moist.  Oropharynx non-erythematous. Neck: No stridor.   Cardiovascular: tachycardia in the 130s, regular rhythm. Grossly normal heart sounds.  Good peripheral circulation. Respiratory: Normal respiratory effort.  No retractions. Lungs CTAB. Gastrointestinal: Soft , minimal tenderness to palpation. Ascites which patient states is chronic. No abdominal bruits. No CVA tenderness. Musculoskeletal: trace lower extremity edema bilaterally.  No joint effusions. Neurologic:  Normal speech and language. No gross focal neurologic deficits are appreciated. Speech is normal. No gait instability. Skin:  Skin is warm, dry and intact. No rash noted. Psychiatric: Mood and affect are normal. Speech and behavior are normal.  ____________________________________________  LABS (all labs ordered are listed, but only abnormal results are displayed)  Labs Reviewed  COMPREHENSIVE METABOLIC PANEL - Abnormal; Notable for the following:    Sodium 130 (*)    Potassium 3.0 (*)    Chloride 89 (*)    CO2 20 (*)    Glucose, Bld 232 (*)    Calcium 7.0 (*)    Total Protein 5.7 (*)    Albumin 2.1 (*)    AST 58 (*)    Alkaline Phosphatase 303 (*)    Total  Bilirubin 4.0 (*)    Anion gap 21 (*)    All other components within normal limits  AMMONIA - Abnormal; Notable for the following:    Ammonia 74 (*)    All other components within normal limits  TROPONIN I - Abnormal; Notable for the following:    Troponin I 0.04 (*)    All other components within normal limits  CBC WITH DIFFERENTIAL/PLATELET - Abnormal; Notable for the following:    WBC 28.6 (*)    RBC 3.29 (*)    Hemoglobin 10.5 (*)    HCT 31.6 (*)    RDW 17.7 (*)    Neutro Abs 27.1 (*)    Lymphs Abs 0.7 (*)    All other components within normal limits  PROTIME-INR - Abnormal; Notable for the following:    Prothrombin Time 15.7 (*)    All other components within normal limits  ETHANOL - Abnormal; Notable for the following:    Alcohol, Ethyl (B) 7 (*)    All other components within normal limits  LACTIC ACID, PLASMA - Abnormal; Notable for the following:    Lactic Acid, Venous 8.9 (*)    All other components within normal limits  ACETAMINOPHEN LEVEL - Abnormal; Notable for the following:    Acetaminophen (Tylenol), Serum <10 (*)    All other components within normal limits  MAGNESIUM - Abnormal; Notable for the following:    Magnesium 1.4 (*)    All other components within normal limits  LACTIC ACID, PLASMA - Abnormal; Notable for the following:    Lactic Acid, Venous 2.6 (*)    All other components within normal limits  GLUCOSE, CAPILLARY - Abnormal; Notable for the following:    Glucose-Capillary 118 (*)    All other components within normal limits  CULTURE, BLOOD (ROUTINE X 2)  CULTURE, BLOOD (ROUTINE X 2)  MRSA CULTURE  LIPASE, BLOOD  APTT  SALICYLATE LEVEL  PHOSPHORUS  BLOOD GAS, ARTERIAL  URINALYSIS COMPLETEWITH MICROSCOPIC ([facility] ONLY)  URINE DRUG SCREEN, QUALITATIVE ([facility] ONLY)  BLOOD GAS, ARTERIAL  HEMOGLOBIN Y1V  BASIC METABOLIC PANEL  CBC   ____________________________________________  EKG  ED ECG REPORT I, Jrue Yambao, the attending physician,  personally viewed and interpreted this ECG.   Date: 05/13/2015  EKG Time: 14:45  Rate: 135  Rhythm: sinus tachycardia  Axis: normal  Intervals:none  ST&T Change: Non-specific ST segment / T-wave changes, but no evidence of acute ischemia.  ____________________________________________  RADIOLOGY  Dg Chest Portable 1 View  05/13/2015   CLINICAL DATA:  Generalized weakness today.  EXAM: PORTABLE CHEST - 1 VIEW  COMPARISON:  Single view of the chest 02/11/2014 and 02/06/2014.  FINDINGS: The lungs are clear. Heart size is normal. There is no pneumothorax or pleural fluid. No focal bony abnormality is identified.  IMPRESSION: Negative chest.   Electronically Signed   By: Inge Rise M.D.   On: 05/13/2015 17:43    ____________________________________________   PROCEDURES  Procedure(s) performed: none  Critical Care performed: Yes, see critical care note(s)   CRITICAL CARE Performed by: Hinda Kehr   Total critical care time: 60 minutes  Critical care time was exclusive of separately billable procedures and treating other patients.  Critical care was necessary to treat or prevent imminent or life-threatening deterioration.  Critical care was time spent personally by me on the following activities: development of treatment plan with patient and/or surrogate as well as nursing, discussions with consultants, evaluation of patient's response to treatment, examination of patient, obtaining history from patient or surrogate, ordering and performing treatments and interventions, ordering and review of laboratory studies, ordering and review of radiographic studies, pulse oximetry and re-evaluation of patient's condition.  ____________________________________________   INITIAL IMPRESSION / ASSESSMENT AND PLAN / ED COURSE  Pertinent labs & imaging results that were available during my care of the patient were reviewed by me and considered in my medical decision making (see chart for  details).  The patient's vital signs were immediately and obviously concerning given his tachycardia and tachypnea.  Given how he was in no acute distress, however, I thought this may be a result of alcohol withdrawal.  As soon as his labs came back, though, it was evident that he is septic from an unknown source.  He has a high leukocytosis and a lactate of greater than 7.  I initiated sepsis protocol treatment including 30 mils per kilo IV fluids, empiric broad-spectrum antibiotics targeted specifically towards the possibility of SBP.  While he was being treated I was managing another emergent patient but empiric antibiotics were more important than diagnostic paracentesis.  I then returned and used the bedside ultrasound and I did find a large pocket of ascites, but it was at a depth of at least 5 cm and I was concerned about overlying bowel.  He has already received antibiotics and I did not feel it was safe to perform a bedside paracentesis at that time.  Of note, Dr. Volanda Napoleon, the hospitalist, was present in the room with me during my ultrasound attempt.  The patient appeared to have a seizure while we were in the room which resolved before Ativan could be administered.  However, I did instruct the nurse to give him Ativan 2 mg IV given the probability of alcohol withdrawal.  ____________________________________________  FINAL CLINICAL IMPRESSION(S) / ED DIAGNOSES  Final diagnoses:  Sepsis, due to unspecified organism  Leukocytosis Complicated alcohol withdrawal Acute seizure due to alcohol withdrawal   Hinda Kehr, MD 05/14/15 1652

## 2015-05-13 NOTE — Progress Notes (Signed)
MD aware of lactic acid of 2.6 no new orders.

## 2015-05-13 NOTE — H&P (Signed)
[facility] [place] NAME: Troy [name]    MR#:  [id number]  DATE OF BIRTH:  04-25-1971  DATE OF ADMISSION:  05/13/2015  PRIMARY CARE PHYSICIAN: No primary care provider on file.   REQUESTING/REFERRING PHYSICIAN: Dr. Karma Greaser   CHIEF COMPLAINT:   Chief Complaint  Patient presents with  . Weakness    HISTORY OF PRESENT ILLNESS:  Troy [name]  is a 44 y.o. male with a known history of alcohol abuse, cirrhosis with ascites requiring paracentesis, seizures and DTs in the past for alcohol withdrawal who presents to the emergency room today with complaint of generalized weakness getting worse for 2 days. He usually ambulates with a cane. He has been having trouble getting around for the past 2 days and today found he was not even able to get up to walk to the car. He denies fevers, chills, chest pain, shortness of breath, nausea or vomiting. He states that he has been trying to cut down on alcohol and is down to 1 pint of liquor daily. His last drink was yesterday. On presentation to the emergency room he is found to be tachycardic 120-130, he has a leukocytosis of 28,000 and a lactic acid level of 8.9. During my examination he began seizing but stopped and was back to baseline within a few minutes.  PAST MEDICAL HISTORY:   Past Medical History  Diagnosis Date  . Liver damage   . Hypertension   . Neuropathy   . Seizures     complicated ETOH withdrawal  . Pancytopenia, acquired   . Ascites due to alcoholic cirrhosis   . DTs (delirium tremens)   . Hypothyroidism     PAST SURGICAL HISTORY:   Past Surgical History  Procedure Laterality Date  . Joint replacement      Left hip replacement    SOCIAL HISTORY:   History  Substance Use Topics  . Smoking status: Former Research scientist (life sciences)  . Smokeless tobacco: Not on file  . Alcohol Use: Yes     Comment: hx of chronic ETOH abuse    FAMILY HISTORY:   Family History  Problem Relation Age of  Onset  . Crohn's disease Father     DRUG ALLERGIES:  No Known Allergies  REVIEW OF SYSTEMS:   Review of Systems  Constitutional: Positive for malaise/fatigue. Negative for fever, chills and weight loss.  HENT: Negative for congestion and hearing loss.   Eyes: Negative for blurred vision and pain.  Respiratory: Negative for cough, hemoptysis, sputum production, shortness of breath and stridor.   Cardiovascular: Negative for chest pain, palpitations, orthopnea and leg swelling.  Gastrointestinal: Negative for nausea, vomiting, abdominal pain, diarrhea, constipation and blood in stool.  Genitourinary: Negative for dysuria and frequency.  Musculoskeletal: Negative for myalgias, back pain, joint pain and neck pain.  Skin: Negative for rash.  Neurological: Positive for weakness. Negative for focal weakness, loss of consciousness and headaches.  Endo/Heme/Allergies: Does not bruise/bleed easily.  Psychiatric/Behavioral: Negative for depression and hallucinations. The patient is not nervous/anxious.     MEDICATIONS AT HOME:   Prior to Admission medications   Not on File      VITAL SIGNS:  Blood pressure 135/90, pulse 127, temperature 97.4 F (36.3 C), temperature source Oral, resp. rate 18, height 5\' 7"  (1.702 m), weight 108.001 kg (238 lb 1.6 oz), SpO2 100 %.  PHYSICAL EXAMINATION:  GENERAL:  44 y.o.-year-old patient lying in the bed, disheveled, obese, no acute distress  EYES: Pupils  pinpoint equal, round, reactive to light and accommodation. No scleral icterus. Extraocular muscles intact.  HEENT: Head atraumatic, normocephalic. Oropharynx and nasopharynx clear. All mucous membranes are moist, fair dentition  NECK:  Supple, no jugular venous distention. No thyroid enlargement, no tenderness.  LUNGS: Normal breath sounds bilaterally, no wheezing, rales,rhonchi or crepitation. No use of accessory muscles of respiration.  CARDIOVASCULAR:  tachycardic S1, S2 normal. No murmurs, rubs,  or gallops.  ABDOMEN: Soft, nontender, nondistended. Bowel sounds present. No organomegaly or mass. Guarding no rebound  EXTREMITIES:+1  pedal edema,no  cyanosis, or clubbing.  NEUROLOGIC: Cranial nerves II through XII are intact. Muscle strength 5/5 in all extremities. Sensation intact. Gait not checked. He is confused. He had seizure-like activity during the examination with a fixed gaze, tense jerking right arm movements lasting about 1 minute.  PSYCHIATRIC: The patient is alert, disoriented, calm  SKIN: No obvious rash, lesion, or ulcer. No jaundice. He does have varicose veins over both ankles  LABORATORY PANEL:   CBC  Recent Labs Lab 05/13/15 1506  WBC 28.6*  HGB 10.5*  HCT 31.6*  PLT 161   ------------------------------------------------------------------------------------------------------------------  Chemistries   Recent Labs Lab 05/13/15 1506  NA 130*  K 3.0*  CL 89*  CO2 20*  GLUCOSE 232*  BUN 15  CREATININE 0.73  CALCIUM 7.0*  AST 58*  ALT 17  ALKPHOS 303*  BILITOT 4.0*   ------------------------------------------------------------------------------------------------------------------  Cardiac Enzymes  Recent Labs Lab 05/13/15 1506  TROPONINI 0.04*   ------------------------------------------------------------------------------------------------------------------  RADIOLOGY:  Dg Chest Portable 1 View  05/13/2015   CLINICAL DATA:  Generalized weakness today.  EXAM: PORTABLE CHEST - 1 VIEW  COMPARISON:  Single view of the chest 02/11/2014 and 02/06/2014.  FINDINGS: The lungs are clear. Heart size is normal. There is no pneumothorax or pleural fluid. No focal bony abnormality is identified.  IMPRESSION: Negative chest.   Electronically Signed   By: Inge Rise M.D.   On: 05/13/2015 17:43    EKG:  No orders found for this or any previous visit.  IMPRESSION AND PLAN:   Active Problems:   Alcohol withdrawal seizure   Lactic acidosis    Leukocytosis  #1 sepsis: Clinical picture of tachycardia, leukocytosis of 28,000, extreme lactic acidosis worrisome for sepsis. Chest x-ray negative, UA is pending, he was evaluated for bedside paracentesis in the emergency room but there was no accessible pocket of fluid. Blood cultures are pending. He is being covered empirically with vancomycin and Rocephin. In this patient with known ascites and confusion I'm concerned for SBP and though he does not have any tenderness on examination. He has received 2 L normal saline in the emergency room and will continue with 100 cc per hour. Would obtain paracentesis once he is no longer having seizures.  #2 alcohol withdrawal with seizure activity: Last drink was yesterday and he has been decreasing his alcohol intake for several days. Have ordered stepdown CIWA protocol with every hour assessments and when necessary Ativan. He is at high risk for additional seizures. Also started thiamine and folate replacement.  #3 high anion gap metabolic acidosis with high lactate: Likely due to sepsis. Baseline lactate and chronic alcoholics can be elevated as high as 4. UDS is pending. Ethylene glycol ingestion would also present as increased lactic acid, he denies any unusual ingestions. We'll check salicylate and acetaminophen and ethanol concentration. We will obtain an ABG. Also noted that he is hyperglycemic with a slightly low bicarbonate, UA pending to look for ketones.  #4  hyperglycemia: I do not see diabetes on his past medical history. We'll check hemoglobin A1c and start sliding scale insulin  #5 hypokalemia: Will replace, check mag and phosphorus as he is likely malnourished.  #6 hyponatremia: Likely due to hyperglycemia as well as poor nutrition. He is receiving normal saline. This should correct   All the records are reviewed and case discussed with ED provider. Management plans discussed with the patient, family and they are in agreement.  CODE STATUS:  full  TOTAL TIME TAKING CARE OF THIS PATIENT: 55 minutes.    Myrtis Ser M.D on 05/13/2015 at 6:38 PM  Between 7am to 6pm - Pager - [phone]  After 6pm go to [email or web address] - password [email or web address] [facility]  [facility]  Office  [phone]  CC: Primary care physician; No primary care provider on file.

## 2015-05-13 NOTE — ED Notes (Signed)
Patient to Ed with c.o generalized weakness. Patient states that he has been having difficulty with mobility. Patient states he felt weak using his walker today and slid down, landing on his knee. Patient states he did not feel comfortable going home alone with changes in mobility. Patient has laceration to the left knee from fall. Patient is alert and oriented.

## 2015-05-13 NOTE — ED Notes (Signed)
Spoke with CCU, received notification admitting RN had not arrived to unit. Will hold pt in ER unit until update from CCU on RN arrival.

## 2015-05-14 LAB — URINALYSIS COMPLETE WITH MICROSCOPIC (ARMC ONLY)
BACTERIA UA: NONE SEEN
Bilirubin Urine: NEGATIVE
GLUCOSE, UA: NEGATIVE mg/dL
Hgb urine dipstick: NEGATIVE
Ketones, ur: NEGATIVE mg/dL
LEUKOCYTES UA: NEGATIVE
NITRITE: NEGATIVE
PROTEIN: NEGATIVE mg/dL
SPECIFIC GRAVITY, URINE: 1.017 (ref 1.005–1.030)
SQUAMOUS EPITHELIAL / LPF: NONE SEEN
pH: 5 (ref 5.0–8.0)

## 2015-05-14 LAB — CBC
HCT: 28.5 % — ABNORMAL LOW (ref 40.0–52.0)
Hemoglobin: 9.5 g/dL — ABNORMAL LOW (ref 13.0–18.0)
MCH: 31.9 pg (ref 26.0–34.0)
MCHC: 33.3 g/dL (ref 32.0–36.0)
MCV: 96.1 fL (ref 80.0–100.0)
PLATELETS: 118 10*3/uL — AB (ref 150–440)
RBC: 2.97 MIL/uL — ABNORMAL LOW (ref 4.40–5.90)
RDW: 17.4 % — ABNORMAL HIGH (ref 11.5–14.5)
WBC: 20.4 10*3/uL — ABNORMAL HIGH (ref 3.8–10.6)

## 2015-05-14 LAB — BASIC METABOLIC PANEL
Anion gap: 9 (ref 5–15)
BUN: 16 mg/dL (ref 6–20)
CALCIUM: 6.6 mg/dL — AB (ref 8.9–10.3)
CO2: 27 mmol/L (ref 22–32)
CREATININE: 0.55 mg/dL — AB (ref 0.61–1.24)
Chloride: 95 mmol/L — ABNORMAL LOW (ref 101–111)
GFR calc non Af Amer: >60 mL/min (ref >60)
Glucose, Bld: 116 mg/dL — ABNORMAL HIGH (ref 65–99)
Potassium: 3.1 mmol/L — ABNORMAL LOW (ref 3.5–5.1)
Sodium: 131 mmol/L — ABNORMAL LOW (ref 135–145)

## 2015-05-14 LAB — C DIFFICILE QUICK SCREEN W PCR REFLEX
C Diff antigen: POSITIVE
C Diff toxin: NEGATIVE

## 2015-05-14 LAB — URINE DRUG SCREEN, QUALITATIVE (ARMC ONLY)
Amphetamines, Ur Screen: NOT DETECTED
Barbiturates, Ur Screen: NOT DETECTED
Benzodiazepine, Ur Scrn: NOT DETECTED
Cannabinoid 50 Ng, Ur ~~LOC~~: NOT DETECTED
Cocaine Metabolite,Ur ~~LOC~~: NOT DETECTED
MDMA (ECSTASY) UR SCREEN: NOT DETECTED
Methadone Scn, Ur: NOT DETECTED
OPIATE, UR SCREEN: POSITIVE — AB
PHENCYCLIDINE (PCP) UR S: NOT DETECTED
Tricyclic, Ur Screen: NOT DETECTED

## 2015-05-14 LAB — GLUCOSE, CAPILLARY
Glucose-Capillary: 119 mg/dL — ABNORMAL HIGH (ref 65–99)
Glucose-Capillary: 149 mg/dL — ABNORMAL HIGH (ref 65–99)
Glucose-Capillary: 104 mg/dL — ABNORMAL HIGH (ref 65–99)
Glucose-Capillary: 109 mg/dL — ABNORMAL HIGH (ref 65–99)

## 2015-05-14 LAB — MRSA PCR SCREENING: MRSA BY PCR: NEGATIVE

## 2015-05-14 LAB — HEMOGLOBIN A1C: HEMOGLOBIN A1C: 5.2 % (ref 4.0–6.0)

## 2015-05-14 LAB — PROTEIN, TOTAL: Total Protein: 5.2 g/dL — ABNORMAL LOW (ref 6.5–8.1)

## 2015-05-14 MED ORDER — POTASSIUM CHLORIDE CRYS ER 10 MEQ PO TBCR
30.0000 meq | EXTENDED_RELEASE_TABLET | ORAL | Status: AC
  Administered 2015-05-14 (×2): 30 meq via ORAL
  Filled 2015-05-14 (×2): qty 3

## 2015-05-14 MED ORDER — DILTIAZEM HCL 60 MG PO TABS
60.0000 mg | ORAL_TABLET | Freq: Three times a day (TID) | ORAL | Status: DC
  Administered 2015-05-14 – 2015-05-16 (×7): 60 mg via ORAL
  Filled 2015-05-14 (×7): qty 1

## 2015-05-14 MED ORDER — VANCOMYCIN HCL 10 G IV SOLR
1500.0000 mg | Freq: Two times a day (BID) | INTRAVENOUS | Status: DC
  Administered 2015-05-14 – 2015-05-16 (×4): 1500 mg via INTRAVENOUS
  Filled 2015-05-14 (×9): qty 1500

## 2015-05-14 MED ORDER — VITAMIN B-1 100 MG PO TABS
100.0000 mg | ORAL_TABLET | Freq: Every day | ORAL | Status: DC
  Administered 2015-05-15 – 2015-05-17 (×3): 100 mg via ORAL
  Filled 2015-05-14 (×3): qty 1

## 2015-05-14 MED ORDER — CEFTRIAXONE SODIUM IN DEXTROSE 40 MG/ML IV SOLN
2.0000 g | INTRAVENOUS | Status: DC
  Administered 2015-05-14 – 2015-05-16 (×3): 2 g via INTRAVENOUS
  Filled 2015-05-14 (×6): qty 50

## 2015-05-14 MED ORDER — FOLIC ACID 1 MG PO TABS
1.0000 mg | ORAL_TABLET | Freq: Every day | ORAL | Status: DC
  Administered 2015-05-15 – 2015-05-17 (×3): 1 mg via ORAL
  Filled 2015-05-14 (×3): qty 1

## 2015-05-14 MED ORDER — ADULT MULTIVITAMIN W/MINERALS CH
1.0000 | ORAL_TABLET | Freq: Every day | ORAL | Status: DC
  Administered 2015-05-15 – 2015-05-17 (×3): 1 via ORAL
  Filled 2015-05-14 (×3): qty 1

## 2015-05-14 NOTE — Progress Notes (Signed)
[place] at [facility]                                                                                                                                                                                            Patient Demographics   Troy [name], is a 44 y.o. male, DOB - 1971/01/27, SWN:[id number]  Admit date - 05/13/2015   Admitting Physician Aldean Jewett, MD  Outpatient Primary MD for the patient is No primary care provider on file.   LOS - 1  Subjective: Patient states that he was going go home today. No further seizures overnight. Denies any abdominal pain nausea vomiting or diarrhea.     Review of Systems:   CONSTITUTIONAL: No documented fever. No fatigue, weakness. No weight gain, no weight loss.  EYES: No blurry or double vision.  ENT: No tinnitus. No postnasal drip. No redness of the oropharynx.  RESPIRATORY: No cough, no wheeze, no hemoptysis. No dyspnea.  CARDIOVASCULAR: No chest pain. No orthopnea. No palpitations. No syncope.  GASTROINTESTINAL: No nausea, no vomiting or diarrhea. No abdominal pain. No melena or hematochezia.  GENITOURINARY: No dysuria or hematuria.  ENDOCRINE: No polyuria or nocturia. No heat or cold intolerance.  HEMATOLOGY: No anemia. No bruising. No bleeding.  INTEGUMENTARY: No rashes. No lesions.  MUSCULOSKELETAL: No arthritis. No swelling. No gout.  NEUROLOGIC: No numbness, tingling, or ataxia. No seizure-type activity.  PSYCHIATRIC: No anxiety. No insomnia. No ADD.    Vitals:   Filed Vitals:   05/14/15 0602 05/14/15 0700 05/14/15 0800 05/14/15 0900  BP:  136/77 122/82 130/83  Pulse:  129 130 129  Temp:  98.2 F (36.8 C)    TempSrc:  Oral    Resp:  15 13 16   Height:      Weight: 104.6 kg (230 lb 9.6 oz)     SpO2:  96% 95% 96%    Wt Readings from Last 3 Encounters:  05/14/15 104.6 kg (230 lb 9.6 oz)     Intake/Output Summary (Last 24 hours) at 05/14/15 1022 Last data filed at 05/14/15 0800  Gross per 24 hour  Intake   1400 ml  Output    200 ml  Net   1200 ml    Physical Exam:   GENERAL: Disheveled in no apparent distress.  HEAD, EYES, EARS, NOSE AND THROAT: Atraumatic, normocephalic. Extraocular muscles are intact. Pupils equal and reactive to light. Sclerae anicteric. No conjunctival injection. No oro-pharyngeal erythema.  NECK: Supple. There is no jugular venous distention. No bruits, no lymphadenopathy, no thyromegaly.  HEART: Regular rate and rhythm, tachycardic. No murmurs, no rubs, no  clicks.  LUNGS: Clear to auscultation bilaterally. No rales or rhonchi. No wheezes.  ABDOMEN: Soft, distended nontender, Has good bowel sounds. No hepatosplenomegaly appreciated.  EXTREMITIES: No evidence of any cyanosis, clubbing, or peripheral edema.  +2 pedal and radial pulses bilaterally.  NEUROLOGIC: The patient is alert, awake, and oriented x3 with no focal motor or sensory deficits appreciated bilaterally.  SKIN: Moist and warm with spider angioma Psych: Not anxious, depressed LN: No inguinal LN enlargement    Antibiotics   Anti-infectives    Start     Dose/Rate Route Frequency Ordered Stop   05/14/15 1700  cefTRIAXone (ROCEPHIN) 2 g in dextrose 5 % 50 mL IVPB - Premix     2 g 100 mL/hr over 30 Minutes Intravenous Every 24 hours 05/14/15 0023     05/14/15 0600  vancomycin (VANCOCIN) 1,500 mg in sodium chloride 0.9 % 500 mL IVPB     1,500 mg 250 mL/hr over 120 Minutes Intravenous Every 12 hours 05/14/15 0023     05/13/15 1700  cefTRIAXone (ROCEPHIN) 2 g in dextrose 5 % 50 mL IVPB - Premix     2 g 100 mL/hr over 30 Minutes Intravenous STAT 05/13/15 1653 05/13/15 1751   05/13/15 1700  vancomycin (VANCOCIN) 2,000 mg in sodium chloride 0.9 % 500 mL IVPB     2,000 mg 250 mL/hr over 120 Minutes Intravenous  Once 05/13/15 1653 05/13/15 2014      Medications   Scheduled Meds: . cefTRIAXone (ROCEPHIN)  IV  2 g Intravenous Q24H  . diltiazem  60 mg Oral 3 times per day  .  insulin aspart  0-5 Units Subcutaneous QHS  . insulin aspart  0-9 Units Subcutaneous TID WC  . lactulose  30 g Oral BID  . vancomycin  1,500 mg Intravenous Q12H   Continuous Infusions:  PRN Meds:.LORazepam, ondansetron **OR** ondansetron (ZOFRAN) IV   Data Review:   Micro Results Recent Results (from the past 240 hour(s))  MRSA PCR Screening     Status: None   Collection Time: 05/13/15  2:26 PM  Result Value Ref Range Status   MRSA by PCR NEGATIVE NEGATIVE Final    Comment:        The GeneXpert MRSA Assay (FDA approved for NASAL specimens only), is one component of a comprehensive MRSA colonization surveillance program. It is not intended to diagnose MRSA infection nor to guide or monitor treatment for MRSA infections.   Blood culture (routine x 2)     Status: None (Preliminary result)   Collection Time: 05/13/15  4:30 PM  Result Value Ref Range Status   Specimen Description BLOOD  Final   Special Requests BLOOD CULTURE RECEIVED  Final   Culture NO GROWTH < 12 HOURS  Final   Report Status PENDING  Incomplete  Blood culture (routine x 2)     Status: None (Preliminary result)   Collection Time: 05/13/15  4:35 PM  Result Value Ref Range Status   Specimen Description BLOOD  Final   Special Requests BLOOD CULTURE RECEIVED  Final   Culture NO GROWTH < 12 HOURS  Final   Report Status PENDING  Incomplete    Radiology Reports Dg Chest Portable 1 View  05/13/2015   CLINICAL DATA:  Generalized weakness today.  EXAM: PORTABLE CHEST - 1 VIEW  COMPARISON:  Single view of the chest 02/11/2014 and 02/06/2014.  FINDINGS: The lungs are clear. Heart size is normal. There is no pneumothorax or pleural fluid. No focal bony abnormality is identified.  IMPRESSION: Negative chest.   Electronically Signed   By: Inge Rise M.D.   On: 05/13/2015 17:43     CBC  Recent Labs Lab 05/13/15 1506 05/14/15 0421  WBC 28.6* 20.4*  HGB 10.5* 9.5*  HCT 31.6* 28.5*  PLT 161 118*  MCV 96.2  96.1  MCH 31.9 31.9  MCHC 33.2 33.3  RDW 17.7* 17.4*  LYMPHSABS 0.7*  --   MONOABS 0.7  --   EOSABS 0.0  --   BASOSABS 0.1  --     Chemistries   Recent Labs Lab 05/13/15 1506 05/13/15 1521 05/14/15 0421  NA 130*  --  131*  K 3.0*  --  3.1*  CL 89*  --  95*  CO2 20*  --  27  GLUCOSE 232*  --  116*  BUN 15  --  16  CREATININE 0.73  --  0.55*  CALCIUM 7.0*  --  6.6*  MG  --  1.4*  --   AST 58*  --   --   ALT 17  --   --   ALKPHOS 303*  --   --   BILITOT 4.0*  --   --    ------------------------------------------------------------------------------------------------------------------ estimated creatinine clearance is 138.2 mL/min (by C-G formula based on Cr of 0.55). ------------------------------------------------------------------------------------------------------------------ No results for input(s): HGBA1C in the last 72 hours. ------------------------------------------------------------------------------------------------------------------ No results for input(s): CHOL, HDL, LDLCALC, TRIG, CHOLHDL, LDLDIRECT in the last 72 hours. ------------------------------------------------------------------------------------------------------------------ No results for input(s): TSH, T4TOTAL, T3FREE, THYROIDAB in the last 72 hours.  Invalid input(s): FREET3 ------------------------------------------------------------------------------------------------------------------ No results for input(s): VITAMINB12, FOLATE, FERRITIN, TIBC, IRON, RETICCTPCT in the last 72 hours.  Coagulation profile  Recent Labs Lab 05/13/15 1506  INR 1.23    No results for input(s): DDIMER in the last 72 hours.  Cardiac Enzymes  Recent Labs Lab 05/13/15 1506  TROPONINI 0.04*   ------------------------------------------------------------------------------------------------------------------ Invalid input(s): POCBNP    Assessment & Plan   #1 sepsis: Concern for s spontaneous bacterial  peritonitis, continue Rocephin and vancomycin for now. I will plan on having paracentesis done tomorrow.  #2 alcohol withdrawal with seizure activity: No evidence of further seizures continue ciwa protocol.   #3 high anion gap metabolic acidosis with high lactate: Likely due to sepsis.  Now resolved  #4 hyperglycemia: I do not see diabetes on his past medical history. Possible reactive hemoglobin A1c currently pending   #5 hypokalemia: Improved past replacement continue to replace   #6 hyponatremia: Likely due to hyperglycemia as well as poor nutrition improved with IV hydration  #7 sinus tachycardia: Related to sepsis alcohol withdrawal I will start him on low-dose Cardizem monitor his heart rate on telemetry      Code Status Orders        Start     Ordered   05/13/15 2055  Full code   Continuous     05/13/15 2054           Consults    DVT Prophylaxis  SCDs   Lab Results  Component Value Date   PLT 118* 05/14/2015     Time Spent in minutes  27min     Dustin Flock M.D on 05/14/2015 at 10:22 AM  Between 7am to 6pm - Pager - [phone]  After 6pm go to [email or web address] - password [email or web address] [facility]   Office  [phone]

## 2015-05-14 NOTE — Progress Notes (Signed)
Dr Reece Levy notified of pt HR 120's-130's, pt is asystematic, BP stable, will cont to monitor.

## 2015-05-14 NOTE — Progress Notes (Signed)
Pt transferred to rm 217, report called to receiving nurse.  All belongings sent w/pt.  Pt cleaned up prior to be sent from having a BM.

## 2015-05-14 NOTE — Progress Notes (Signed)
Contacted by nursing about C dif results - toxin neg, antigen positive, PCR neg?  Will leave on isolation for now, will not start abx.  Day team to clarify this with lab.  Jacqulyn Bath [facility] 05/14/2015, 10:25 PM

## 2015-05-14 NOTE — Progress Notes (Signed)
Spoke with Dr. Jannifer Franklin about C.diff results - not changing abx, leave on isolation for tonight, day rounding MDs to follow up.

## 2015-05-14 NOTE — Consult Note (Signed)
[facility] Consult   Reason for Consult:  Follow up Referring Physician:  Floor Physician Patient Identification: Troy [name] MRN:  [id number] Principal Diagnosis: Alcohol dependence chronic continous  Diagnosis:   Patient Active Problem List   Diagnosis Date Noted  . Alcohol withdrawal seizure [F10.239] 05/13/2015  . Lactic acidosis [E87.2] 05/13/2015  . Leukocytosis [D72.829] 05/13/2015    Total Time spent with patient: 45 minutes  Subjective:   Troy [name] is a 44 y.o. male patient admitted with a long H/O alcohol dependence ie 32 yrs or more.Pt is married for 2 nd time and lives with wife that is 55 yrs old and is employed as a Oceanographer for grade school. CC" I went to visit my sister on Sunday as she is having a party and I feel as I have Neuropathy and weakness.".  HPI:  Long H/O alcohol drinking with physical problems. Was transferred from ICU where he was admitted for intoxication but pt denies as he calls himself a binge drniker. HPI Elements:   Had treatment at [facility] early part of this yr and been sober for 3 wks and started drinking again because of "boredom." Had one DWI and lost his DL and got it back. No h/O Inpt  To psychiatry. No H/O suicide attempts.  Past Medical History:  Past Medical History  Diagnosis Date  . Liver damage   . Hypertension   . Neuropathy   . Seizures     complicated ETOH withdrawal  . Pancytopenia, acquired   . Ascites due to alcoholic cirrhosis   . DTs (delirium tremens)   . Hypothyroidism     Past Surgical History  Procedure Laterality Date  . Joint replacement      Left hip replacement   Family History:  Family History  Problem Relation Age of Onset  . Crohn's disease Father    Social History:  History  Alcohol Use  . Yes    Comment: hx of chronic ETOH abuse     History  Drug Use Not on file    History   Social History  . Marital Status: Married    Spouse Name: N/A  . Number of  Children: N/A  . Years of Education: N/A   Social History Main Topics  . Smoking status: Former Research scientist (life sciences)  . Smokeless tobacco: Not on file  . Alcohol Use: Yes     Comment: hx of chronic ETOH abuse  . Drug Use: Not on file  . Sexual Activity: Not on file   Other Topics Concern  . None   Social History Narrative  . None   Additional Social History:                          Allergies:  No Known Allergies  Labs:  Results for orders placed or performed during the hospital encounter of 05/13/15 (from the past 48 hour(s))  MRSA PCR Screening     Status: None   Collection Time: 05/13/15  2:26 PM  Result Value Ref Range   MRSA by PCR NEGATIVE NEGATIVE    Comment:        The GeneXpert MRSA Assay (FDA approved for NASAL specimens only), is one component of a comprehensive MRSA colonization surveillance program. It is not intended to diagnose MRSA infection nor to guide or monitor treatment for MRSA infections.   Comprehensive metabolic panel     Status: Abnormal   Collection Time: 05/13/15  3:06 PM  Result Value Ref Range   Sodium 130 (L) 135 - 145 mmol/L   Potassium 3.0 (L) 3.5 - 5.1 mmol/L   Chloride 89 (L) 101 - 111 mmol/L   CO2 20 (L) 22 - 32 mmol/L   Glucose, Bld 232 (H) 65 - 99 mg/dL   BUN 15 6 - 20 mg/dL   Creatinine, Ser 0.73 0.61 - 1.24 mg/dL   Calcium 7.0 (L) 8.9 - 10.3 mg/dL   Total Protein 5.7 (L) 6.5 - 8.1 g/dL   Albumin 2.1 (L) 3.5 - 5.0 g/dL   AST 58 (H) 15 - 41 U/L   ALT 17 17 - 63 U/L   Alkaline Phosphatase 303 (H) 38 - 126 U/L   Total Bilirubin 4.0 (H) 0.3 - 1.2 mg/dL   GFR calc non Af Amer >60 >60 mL/min   GFR calc Af Amer >60 >60 mL/min    Comment: (NOTE) The eGFR has been calculated using the CKD EPI equation. This calculation has not been validated in all clinical situations. eGFR's persistently <60 mL/min signify possible Chronic Kidney Disease.    Anion gap 21 (H) 5 - 15  Lipase, blood     Status: None   Collection Time: 05/13/15   3:06 PM  Result Value Ref Range   Lipase 36 22 - 51 U/L  Troponin I     Status: Abnormal   Collection Time: 05/13/15  3:06 PM  Result Value Ref Range   Troponin I 0.04 (H) <0.031 ng/mL    Comment:        PERSISTENTLY INCREASED TROPONIN VALUES IN THE RANGE OF 0.04-0.49 ng/mL CAN BE SEEN IN:       -UNSTABLE ANGINA       -CONGESTIVE HEART FAILURE       -MYOCARDITIS       -CHEST TRAUMA       -ARRYHTHMIAS       -LATE PRESENTING MYOCARDIAL INFARCTION       -COPD   CLINICAL FOLLOW-UP RECOMMENDED. TROPONIN CALLED TO JENNIFER JONES @ 9417 ON 05/13/15 BY [facility]F RESULTS REPEATED AND RESULTS VERIFIED  AGAP RESULTS REPEATED   CBC with Differential/Platelet     Status: Abnormal   Collection Time: 05/13/15  3:06 PM  Result Value Ref Range   WBC 28.6 (H) 3.8 - 10.6 K/uL   RBC 3.29 (L) 4.40 - 5.90 MIL/uL   Hemoglobin 10.5 (L) 13.0 - 18.0 g/dL   HCT 31.6 (L) 40.0 - 52.0 %   MCV 96.2 80.0 - 100.0 fL   MCH 31.9 26.0 - 34.0 pg   MCHC 33.2 32.0 - 36.0 g/dL   RDW 17.7 (H) 11.5 - 14.5 %   Platelets 161 150 - 440 K/uL   Neutrophils Relative % 95 %   Neutro Abs 27.1 (H) 1.4 - 6.5 K/uL   Lymphocytes Relative 3 %   Lymphs Abs 0.7 (L) 1.0 - 3.6 K/uL   Monocytes Relative 2 %   Monocytes Absolute 0.7 0.2 - 1.0 K/uL   Eosinophils Relative 0 %   Eosinophils Absolute 0.0 0 - 0.7 K/uL   Basophils Relative 0 %   Basophils Absolute 0.1 0 - 0.1 K/uL  Protime-INR     Status: Abnormal   Collection Time: 05/13/15  3:06 PM  Result Value Ref Range   Prothrombin Time 15.7 (H) 11.4 - 15.0 seconds   INR 1.23   APTT     Status: None   Collection Time: 05/13/15  3:06 PM  Result Value Ref  Range   aPTT 31 24 - 36 seconds  Ethanol     Status: Abnormal   Collection Time: 05/13/15  3:06 PM  Result Value Ref Range   Alcohol, Ethyl (B) 7 (H) <5 mg/dL    Comment:        LOWEST DETECTABLE LIMIT FOR SERUM ALCOHOL IS 11 mg/dL FOR MEDICAL PURPOSES ONLY   Ammonia     Status: Abnormal   Collection Time: 05/13/15   3:21 PM  Result Value Ref Range   Ammonia 74 (H) 9 - 35 umol/L  Salicylate level     Status: None   Collection Time: 05/13/15  3:21 PM  Result Value Ref Range   Salicylate Lvl <7.1 2.8 - 30.0 mg/dL  Acetaminophen level     Status: Abnormal   Collection Time: 05/13/15  3:21 PM  Result Value Ref Range   Acetaminophen (Tylenol), Serum <10 (L) 10 - 30 ug/mL    Comment:        THERAPEUTIC CONCENTRATIONS VARY SIGNIFICANTLY. A RANGE OF 10-30 ug/mL MAY BE AN EFFECTIVE CONCENTRATION FOR MANY PATIENTS. HOWEVER, SOME ARE BEST TREATED AT CONCENTRATIONS OUTSIDE THIS RANGE. ACETAMINOPHEN CONCENTRATIONS >150 ug/mL AT 4 HOURS AFTER INGESTION AND >50 ug/mL AT 12 HOURS AFTER INGESTION ARE OFTEN ASSOCIATED WITH TOXIC REACTIONS.   Hemoglobin A1c     Status: None   Collection Time: 05/13/15  3:21 PM  Result Value Ref Range   Hgb A1c MFr Bld 5.2 4.0 - 6.0 %  Magnesium     Status: Abnormal   Collection Time: 05/13/15  3:21 PM  Result Value Ref Range   Magnesium 1.4 (L) 1.7 - 2.4 mg/dL  Phosphorus     Status: None   Collection Time: 05/13/15  3:21 PM  Result Value Ref Range   Phosphorus 3.3 2.5 - 4.6 mg/dL  Lactic acid, plasma     Status: Abnormal   Collection Time: 05/13/15  3:23 PM  Result Value Ref Range   Lactic Acid, Venous 8.9 (HH) 0.5 - 2.0 mmol/L    Comment: LACTIC ACID CRITICAL RESULT CALLED TO, READ BACK BY AND VERIFIED WITH JENNIFER JONES @ 1700 ON 05/13/2015 [facility]   Blood culture (routine x 2)     Status: None (Preliminary result)   Collection Time: 05/13/15  4:30 PM  Result Value Ref Range   Specimen Description BLOOD    Special Requests BLOOD CULTURE RECEIVED    Culture NO GROWTH < 12 HOURS    Report Status PENDING   Blood culture (routine x 2)     Status: None (Preliminary result)   Collection Time: 05/13/15  4:35 PM  Result Value Ref Range   Specimen Description BLOOD    Special Requests BLOOD CULTURE RECEIVED    Culture NO GROWTH < 12 HOURS    Report Status PENDING    Glucose, capillary     Status: Abnormal   Collection Time: 05/13/15  8:31 PM  Result Value Ref Range   Glucose-Capillary 118 (H) 65 - 99 mg/dL  Lactic acid, plasma     Status: Abnormal   Collection Time: 05/13/15  9:42 PM  Result Value Ref Range   Lactic Acid, Venous 2.6 (HH) 0.5 - 2.0 mmol/L    Comment: CRITICAL RESULT CALLED TO, READ BACK BY AND VERIFIED WITH RESULTS VERIFIED BY REPEAT TESTING [facility] AT 2234 05/13/15.PMH   Basic metabolic panel     Status: Abnormal   Collection Time: 05/14/15  4:21 AM  Result Value Ref Range   Sodium  131 (L) 135 - 145 mmol/L   Potassium 3.1 (L) 3.5 - 5.1 mmol/L   Chloride 95 (L) 101 - 111 mmol/L   CO2 27 22 - 32 mmol/L   Glucose, Bld 116 (H) 65 - 99 mg/dL   BUN 16 6 - 20 mg/dL   Creatinine, Ser 0.55 (L) 0.61 - 1.24 mg/dL   Calcium 6.6 (L) 8.9 - 10.3 mg/dL   GFR calc non Af Amer >60 >60 mL/min   GFR calc Af Amer >60 >60 mL/min    Comment: (NOTE) The eGFR has been calculated using the CKD EPI equation. This calculation has not been validated in all clinical situations. eGFR's persistently <60 mL/min signify possible Chronic Kidney Disease.    Anion gap 9 5 - 15  CBC     Status: Abnormal   Collection Time: 05/14/15  4:21 AM  Result Value Ref Range   WBC 20.4 (H) 3.8 - 10.6 K/uL   RBC 2.97 (L) 4.40 - 5.90 MIL/uL   Hemoglobin 9.5 (L) 13.0 - 18.0 g/dL   HCT 28.5 (L) 40.0 - 52.0 %   MCV 96.1 80.0 - 100.0 fL   MCH 31.9 26.0 - 34.0 pg   MCHC 33.3 32.0 - 36.0 g/dL   RDW 17.4 (H) 11.5 - 14.5 %   Platelets 118 (L) 150 - 440 K/uL  Protein, total     Status: Abnormal   Collection Time: 05/14/15  4:21 AM  Result Value Ref Range   Total Protein 5.2 (L) 6.5 - 8.1 g/dL  Urinalysis complete, with microscopic [facility])     Status: Abnormal   Collection Time: 05/14/15  6:50 AM  Result Value Ref Range   Color, Urine AMBER (A) YELLOW   APPearance CLEAR (A) CLEAR   Glucose, UA NEGATIVE NEGATIVE mg/dL   Bilirubin Urine NEGATIVE NEGATIVE    Ketones, ur NEGATIVE NEGATIVE mg/dL   Specific Gravity, Urine 1.017 1.005 - 1.030   Hgb urine dipstick NEGATIVE NEGATIVE   pH 5.0 5.0 - 8.0   Protein, ur NEGATIVE NEGATIVE mg/dL   Nitrite NEGATIVE NEGATIVE   Leukocytes, UA NEGATIVE NEGATIVE   RBC / HPF 0-5 0 - 5 RBC/hpf   WBC, UA 0-5 0 - 5 WBC/hpf   Bacteria, UA NONE SEEN NONE SEEN   Squamous Epithelial / LPF NONE SEEN NONE SEEN   Mucous PRESENT    Hyaline Casts, UA PRESENT   Urine Drug Screen, Qualitative ([facility] only)     Status: Abnormal   Collection Time: 05/14/15  6:50 AM  Result Value Ref Range   Tricyclic, Ur Screen NONE DETECTED NONE DETECTED   Amphetamines, Ur Screen NONE DETECTED NONE DETECTED   MDMA (Ecstasy)Ur Screen NONE DETECTED NONE DETECTED   Cocaine Metabolite,Ur [place] NONE DETECTED NONE DETECTED   Opiate, Ur Screen POSITIVE (A) NONE DETECTED   Phencyclidine (PCP) Ur S NONE DETECTED NONE DETECTED   Cannabinoid 50 Ng, Ur [place] NONE DETECTED NONE DETECTED   Barbiturates, Ur Screen NONE DETECTED NONE DETECTED   Benzodiazepine, Ur Scrn NONE DETECTED NONE DETECTED   Methadone Scn, Ur NONE DETECTED NONE DETECTED    Comment: (NOTE) 248  Tricyclics, urine               Cutoff 1000 ng/mL 200  Amphetamines, urine             Cutoff 1000 ng/mL 300  MDMA (Ecstasy), urine           Cutoff 500 ng/mL 400  Cocaine Metabolite, urine  Cutoff 300 ng/mL 500  Opiate, urine                   Cutoff 300 ng/mL 600  Phencyclidine (PCP), urine      Cutoff 25 ng/mL 700  Cannabinoid, urine              Cutoff 50 ng/mL 800  Barbiturates, urine             Cutoff 200 ng/mL 900  Benzodiazepine, urine           Cutoff 200 ng/mL 1000 Methadone, urine                Cutoff 300 ng/mL 1100 1200 The urine drug screen provides only a preliminary, unconfirmed 1300 analytical test result and should not be used for non-medical 1400 purposes. Clinical consideration and professional judgment should 1500 be applied to any positive drug screen result due  to possible 1600 interfering substances. A more specific alternate chemical method 1700 must be used in order to obtain a confirmed analytical result.  1800 Gas chromato graphy / mass spectrometry (GC/MS) is the preferred 1900 confirmatory method.   Glucose, capillary     Status: Abnormal   Collection Time: 05/14/15  7:14 AM  Result Value Ref Range   Glucose-Capillary 119 (H) 65 - 99 mg/dL  Glucose, capillary     Status: Abnormal   Collection Time: 05/14/15 11:41 AM  Result Value Ref Range   Glucose-Capillary 149 (H) 65 - 99 mg/dL  Glucose, capillary     Status: Abnormal   Collection Time: 05/14/15  4:18 PM  Result Value Ref Range   Glucose-Capillary 104 (H) 65 - 99 mg/dL    Vitals: Blood pressure 126/76, pulse 117, temperature 98.1 F (36.7 C), temperature source Oral, resp. rate 16, height 5' 8"  (1.727 m), weight 104.6 kg (230 lb 9.6 oz), SpO2 100 %.  Risk to Self: Is patient at risk for suicide?: No Risk to Others:   Prior Inpatient Therapy:   Prior Outpatient Therapy:    Current Facility-Administered Medications  Medication Dose Route Frequency Provider Last Rate Last Dose  . cefTRIAXone (ROCEPHIN) 2 g in dextrose 5 % 50 mL IVPB - Premix  2 g Intravenous Q24H Vira Blanco, RPH      . diltiazem (CARDIZEM) tablet 60 mg  60 mg Oral 3 times per day Dustin Flock, MD   60 mg at 05/14/15 1444  . insulin aspart (novoLOG) injection 0-5 Units  0-5 Units Subcutaneous QHS Aldean Jewett, MD   0 Units at 05/13/15 2205  . insulin aspart (novoLOG) injection 0-9 Units  0-9 Units Subcutaneous TID WC Aldean Jewett, MD   1 Units at 05/14/15 1224  . lactulose (CHRONULAC) 10 GM/15ML solution 30 g  30 g Oral BID Aldean Jewett, MD   30 g at 05/14/15 0818  . LORazepam (ATIVAN) injection 2-3 mg  2-3 mg Intravenous Q1H PRN Aldean Jewett, MD      . ondansetron [facility] Iv) tablet 4 mg  4 mg Oral Q6H PRN Aldean Jewett, MD       Or  . ondansetron [facility]) injection 4 mg  4 mg  Intravenous Q6H PRN Aldean Jewett, MD      . vancomycin (VANCOCIN) 1,500 mg in sodium chloride 0.9 % 500 mL IVPB  1,500 mg Intravenous Q12H Vira Blanco, RPH   1,500 mg at 05/14/15 0500    Musculoskeletal: Strength & Muscle Tone: not tested Gait &  Station: not tested Patient leans: not tested as pt was laying in bed.  Psychiatric Specialty Exam: Physical Exam  Review of Systems  Constitutional: Negative.   HENT: Negative.   Respiratory: Negative.   Cardiovascular: Negative.   Gastrointestinal: Positive for heartburn.  Genitourinary: Negative.   Musculoskeletal: Positive for falls.  Skin: Negative.   Neurological: Positive for sensory change.  Endo/Heme/Allergies: Negative.   Psychiatric/Behavioral: Positive for substance abuse.    Blood pressure 126/76, pulse 117, temperature 98.1 F (36.7 C), temperature source Oral, resp. rate 16, height 5' 8"  (1.727 m), weight 104.6 kg (230 lb 9.6 oz), SpO2 100 %.Body mass index is 35.07 kg/(m^2).  General Appearance: Casual  Eye Contact::  Fair  Speech:  Clear and Coherent  Volume:  Normal  Mood:  Anxious  Affect:  Appropriate  Thought Process:  Circumstantial  Orientation:  Full (Time, Place, and Person)  Thought Content:  Negative  Suicidal Thoughts:  No  Homicidal Thoughts:  No  Memory:  Immediate;   Fair Recent;   Fair Remote;   Fair adequate  Judgement:  Fair  Insight:  Lacking  Psychomotor Activity:  Normal  Concentration:  Fair  Recall:  [employer] [place]  Language: Fair  Akathisia:  No  Handed:  Right  AIMS (if indicated):     Assets:  Museum/gallery curator Social Support Transportation  ADL's:  Intact  Cognition: WNL  Sleep:      Medical Decision Making: Established Problem, Stable/Improving (1)  Treatment Plan Summary: Plan Pt is aware of consequences of his alcohol drinking but does not want to get help from any Program at this time and wants to cut down  his drinking  to moderate level without help from outside and feels that he can do it. If he wishes to get help he will get information about various programs in the [employer].  Plan:  No evidence of imminent risk to self or others at present.   Disposition: as above  Yuleimy Kretz K 05/14/2015 5:54 PM

## 2015-05-14 NOTE — Clinical Social Work Note (Signed)
Clinical Social Work Assessment  Patient Details  Name: Troy [name] MRN: [id number] Date of Birth: January 08, 1971  Date of referral:  05/14/15               Reason for consult:  Substance Use/ETOH Abuse                Permission sought to share information with:    Permission granted to share information::     Name::        Agency::     Relationship::     Contact Information:     Housing/Transportation Living arrangements for the past 2 months:  Apartment Source of Information:  Patient Patient Interpreter Needed:  None Criminal Activity/Legal Involvement Pertinent to Current Situation/Hospitalization:  Yes Significant Relationships:    Lives with:  Spouse Do you feel safe going back to the place where you live?  Yes Need for family participation in patient care:  No (Coment)  Care giving concerns:  None    Facilities manager / plan:  CSW spoke with patient this afternoon regarding his ETOH abuse. Patient had transferred from ICU this afternoon out to the medical floor. Patient was slow in his speech and had difficulty sometimes getting words out. Patient states he is married and that his wife is not a drinker and that she is supportive of him. Patient reports he got a DWI in January and that he is working on getting his license back. Patient reports that he has cut down from a fifth of vodka a day to a pint. He states he obtains his own alcohol. As CSW kept speaking to patient, his wording became slower and slower and he stated he was getting tired. CSW informed patient we would finish assessment tomorrow.   Employment status:  Disabled (Comment on whether or not currently receiving Disability) Insurance information:  Managed Medicare PT Recommendations:  Not assessed at this time Information / Referral to community resources:     Patient/Family's Response to care:  cooperative Patient/Family's Understanding of and Emotional Response to Diagnosis, Current Treatment, and  Prognosis:  Lack of full understanding how his drinking is impacting his health.   Emotional Assessment Appearance:  Appears older than stated age Attitude/Demeanor/Rapport:   (pleasant cooperative) Affect (typically observed):  Accepting, Hopeful Orientation:  Oriented to Self, Oriented to Place, Oriented to  Time, Oriented to Situation Alcohol / Substance use:  Alcohol Use, Illicit Drugs Psych involvement (Current and /or in the community):  No (Comment)  Discharge Needs  Concerns to be addressed:  Substance Abuse Concerns Readmission within the last 30 days:  No Current discharge risk:  Legal Concerns, Substance Abuse Barriers to Discharge:  No Barriers Identified   Shela Leff, LCSW 05/14/2015, 2:44 PM

## 2015-05-14 NOTE — Progress Notes (Signed)
ANTIBIOTIC CONSULT NOTE - INITIAL  Pharmacy Consult for Vancomycin and Ceftriaxone Indication: rule out sepsis  No Known Allergies  Patient Measurements: Height: 5\' 8"  (172.7 cm) Weight: 230 lb 9.6 oz (104.6 kg) IBW/kg (Calculated) : 68.4 Adjusted Body Weight: 82.9 kg  Vital Signs: Temp: 98.1 F (36.7 C) (05/29 2055) Temp Source: Oral (05/29 2055) BP: 133/85 mmHg (05/30 0000) Pulse Rate: 119 (05/30 0000) Intake/Output from previous day: 05/29 0701 - 05/30 0700 In: 300 [IV Piggyback:300] Out: 200 [Urine:200] Intake/Output from this shift: Total I/O In: 300 [IV Piggyback:300] Out: 200 [Urine:200]  Labs:  Recent Labs  05/13/15 1506  WBC 28.6*  HGB 10.5*  PLT 161  CREATININE 0.73   Estimated Creatinine Clearance: 138.2 mL/min (by C-G formula based on Cr of 0.73). No results for input(s): VANCOTROUGH, VANCOPEAK, VANCORANDOM, GENTTROUGH, GENTPEAK, GENTRANDOM, TOBRATROUGH, TOBRAPEAK, TOBRARND, AMIKACINPEAK, AMIKACINTROU, AMIKACIN in the last 72 hours.   Microbiology: No results found for this or any previous visit (from the past 720 hour(s)).  Medical History: Past Medical History  Diagnosis Date  . Liver damage   . Hypertension   . Neuropathy   . Seizures     complicated ETOH withdrawal  . Pancytopenia, acquired   . Ascites due to alcoholic cirrhosis   . DTs (delirium tremens)   . Hypothyroidism     Medications:  Anti-infectives    Start     Dose/Rate Route Frequency Ordered Stop   05/14/15 1700  cefTRIAXone (ROCEPHIN) 2 g in dextrose 5 % 50 mL IVPB - Premix     2 g 100 mL/hr over 30 Minutes Intravenous Every 24 hours 05/14/15 0023     05/14/15 0600  vancomycin (VANCOCIN) 1,500 mg in sodium chloride 0.9 % 500 mL IVPB     1,500 mg 250 mL/hr over 120 Minutes Intravenous Every 12 hours 05/14/15 0023     05/13/15 1700  cefTRIAXone (ROCEPHIN) 2 g in dextrose 5 % 50 mL IVPB - Premix     2 g 100 mL/hr over 30 Minutes Intravenous STAT 05/13/15 1653 05/13/15 1751    05/13/15 1700  vancomycin (VANCOCIN) 2,000 mg in sodium chloride 0.9 % 500 mL IVPB     2,000 mg 250 mL/hr over 120 Minutes Intravenous  Once 05/13/15 1653 05/13/15 2014     Assessment: Patient admitted for seizure. Patient with h/o alcoholic cirrhosis. Antibiotics initiated to cover for possible sepsis and SBP in patient with ascites. Received Vancomycin 2g IV once and Ceftriaxone 2g IV once.  Ke=0.09, t1/2=7.7hr, Vd=58L  Goal of Therapy:  Vancomycin trough level 15-20 mcg/ml  Plan:  Measure antibiotic drug levels at steady state Follow up culture results Vancomycin 1500mg  IV q12h  Ceftriaxone 2g IV q24h  Paulina Fusi, PharmD, BCPS 05/14/2015 12:50 AM

## 2015-05-15 ENCOUNTER — Inpatient Hospital Stay: Payer: 59

## 2015-05-15 LAB — BASIC METABOLIC PANEL
Anion gap: 8 (ref 5–15)
BUN: 9 mg/dL (ref 6–20)
CALCIUM: 6.8 mg/dL — AB (ref 8.9–10.3)
CO2: 22 mmol/L (ref 22–32)
CREATININE: 0.8 mg/dL (ref 0.61–1.24)
Chloride: 107 mmol/L (ref 101–111)
GFR calc Af Amer: >60 mL/min (ref >60)
GLUCOSE: 134 mg/dL — AB (ref 65–99)
Potassium: 2.9 mmol/L — CL (ref 3.5–5.1)
Sodium: 137 mmol/L (ref 135–145)

## 2015-05-15 LAB — CBC
HEMATOCRIT: 25.4 % — AB (ref 40.0–52.0)
Hemoglobin: 8.1 g/dL — ABNORMAL LOW (ref 13.0–18.0)
MCH: 31.6 pg (ref 26.0–34.0)
MCHC: 31.9 g/dL — AB (ref 32.0–36.0)
MCV: 98.9 fL (ref 80.0–100.0)
Platelets: 101 10*3/uL — ABNORMAL LOW (ref 150–440)
RBC: 2.57 MIL/uL — ABNORMAL LOW (ref 4.40–5.90)
RDW: 18 % — ABNORMAL HIGH (ref 11.5–14.5)
WBC: 13.3 10*3/uL — ABNORMAL HIGH (ref 3.8–10.6)

## 2015-05-15 LAB — BLOOD GAS, ARTERIAL
Acid-Base Excess: 2.6 mmol/L (ref 0.0–3.0)
Allens test (pass/fail): POSITIVE — AB
BICARBONATE: 24.2 meq/L (ref 21.0–28.0)
FIO2: 0.21 %
O2 Saturation: 97.7 %
PCO2 ART: 27 mmHg — AB (ref 32.0–48.0)
PO2 ART: 85 mmHg (ref 83.0–108.0)
Patient temperature: 37
pH, Arterial: 7.56 — ABNORMAL HIGH (ref 7.350–7.450)

## 2015-05-15 LAB — GLUCOSE, CAPILLARY
GLUCOSE-CAPILLARY: 88 mg/dL (ref 65–99)
Glucose-Capillary: 94 mg/dL (ref 65–99)
Glucose-Capillary: 114 mg/dL — ABNORMAL HIGH (ref 65–99)
Glucose-Capillary: 115 mg/dL — ABNORMAL HIGH (ref 65–99)

## 2015-05-15 LAB — POTASSIUM: Potassium: 2.7 mmol/L — CL (ref 3.5–5.1)

## 2015-05-15 LAB — MAGNESIUM: MAGNESIUM: 1.3 mg/dL — AB (ref 1.7–2.4)

## 2015-05-15 LAB — CLOSTRIDIUM DIFFICILE BY PCR: CDIFFPCR: POSITIVE — AB

## 2015-05-15 MED ORDER — POTASSIUM CHLORIDE 10 MEQ/100ML IV SOLN
10.0000 meq | INTRAVENOUS | Status: AC
  Administered 2015-05-15 (×4): 10 meq via INTRAVENOUS
  Filled 2015-05-15 (×4): qty 100

## 2015-05-15 MED ORDER — POTASSIUM CHLORIDE CRYS ER 20 MEQ PO TBCR
40.0000 meq | EXTENDED_RELEASE_TABLET | ORAL | Status: AC
  Administered 2015-05-15 (×2): 40 meq via ORAL
  Filled 2015-05-15 (×2): qty 2

## 2015-05-15 MED ORDER — MAGNESIUM SULFATE 2 GM/50ML IV SOLN
2.0000 g | Freq: Once | INTRAVENOUS | Status: AC
  Administered 2015-05-15: 2 g via INTRAVENOUS
  Filled 2015-05-15: qty 50

## 2015-05-15 MED ORDER — POTASSIUM CHLORIDE 10 MEQ/100ML IV SOLN
10.0000 meq | INTRAVENOUS | Status: AC
  Administered 2015-05-15 – 2015-05-16 (×4): 10 meq via INTRAVENOUS
  Filled 2015-05-15 (×4): qty 100

## 2015-05-15 MED ORDER — METRONIDAZOLE 500 MG PO TABS
500.0000 mg | ORAL_TABLET | Freq: Four times a day (QID) | ORAL | Status: DC
  Administered 2015-05-15 – 2015-05-17 (×8): 500 mg via ORAL
  Filled 2015-05-15 (×8): qty 1

## 2015-05-15 NOTE — Progress Notes (Signed)
Per Dr. Darvin Neighbours order for PT consult as well as order for BMP to recheck potassium.

## 2015-05-15 NOTE — Progress Notes (Signed)
Dr. Jannifer Franklin notified of 9 beat run of SVT and back to ST as reported by tele clerk - pt was asymptomatic -MD reviewed chart and stated to monitor for now - pt already on tele.

## 2015-05-15 NOTE — Progress Notes (Signed)
Per Dr. Vianne Bulls can discontinue contact. Also an order for CBC placed.

## 2015-05-15 NOTE — Plan of Care (Signed)
Problem: Phase I Progression Outcomes Goal: OOB as tolerated unless otherwise ordered Outcome: Progressing Still very weak

## 2015-05-15 NOTE — Progress Notes (Signed)
Notified MD Vianne Bulls of critical potassium 2.7.

## 2015-05-15 NOTE — Progress Notes (Signed)
Spoke with Dr. Reece Levy about critical K level this AM - K =2.4, on 5/30 K= 3.1 - pt already on tele monitoring, give 29mEq K IV and recheck K in 4hrs.

## 2015-05-15 NOTE — Progress Notes (Signed)
[place] at [facility]                                                                                                                                                                                            Patient Demographics   Troy [name], is a 44 y.o. male, DOB - March 07, 1971, OZH:[id number]  Admit date - 05/13/2015   Admitting Physician Aldean Jewett, MD  Outpatient Primary MD for the patient is No primary care provider on file.   LOS - 2  Subjective: for paracentesis  Today.Cdiff PCr is negative.so d/c isolation.no abdominal pain.no sob.alert,orineted.   Review of Systems:   CONSTITUTIONAL: No documented fever. No fatigue, weakness. No weight gain, no weight loss.  EYES: No blurry or double vision.  ENT: No tinnitus. No postnasal drip. No redness of the oropharynx.  RESPIRATORY: No cough, no wheeze, no hemoptysis. No dyspnea.  CARDIOVASCULAR: No chest pain. No orthopnea. No palpitations. No syncope.  GASTROINTESTINAL: No nausea, no vomiting or diarrhea. No abdominal pain. No melena or hematochezia.  GENITOURINARY: No dysuria or hematuria.  ENDOCRINE: No polyuria or nocturia. No heat or cold intolerance.  HEMATOLOGY: No anemia. No bruising. No bleeding.  INTEGUMENTARY: No rashes. No lesions.  MUSCULOSKELETAL: No arthritis. No swelling. No gout.  NEUROLOGIC: No numbness, tingling, or ataxia. No seizure-type activity.  PSYCHIATRIC: No anxiety. No insomnia. No ADD.    Vitals:   Filed Vitals:   05/15/15 0007 05/15/15 0543 05/15/15 0837 05/15/15 1059  BP: 116/59 105/69 107/61 120/72  Pulse: 109  103 106  Temp: 98.8 F (37.1 C)  98.4 F (36.9 C)   TempSrc: Oral  Oral   Resp: 17  18 18   Height:      Weight:      SpO2: 100%  100% 99%    Wt Readings from Last 3 Encounters:  05/14/15 104.6 kg (230 lb 9.6 oz)     Intake/Output Summary (Last 24 hours) at 05/15/15 1149 Last data filed at 05/15/15 0743  Gross per 24 hour  Intake     648 ml  Output      0 ml  Net    648 ml    Physical Exam:   GENERAL: Disheveled in no apparent distress.  HEAD, EYES, EARS, NOSE AND THROAT: Atraumatic, normocephalic. Extraocular muscles are intact. Pupils equal and reactive to light. Sclerae anicteric. No conjunctival injection. No oro-pharyngeal erythema.  NECK: Supple. There is no jugular venous distention. No bruits, no lymphadenopathy, no thyromegaly.  HEART: Regular rate and rhythm, tachycardic. No murmurs, no rubs, no clicks.  LUNGS: Clear to auscultation bilaterally. No  rales or rhonchi. No wheezes.  ABDOMEN: Soft, distended nontender, Has good bowel sounds. No hepatosplenomegaly appreciated.  EXTREMITIES: No evidence of any cyanosis, clubbing, or peripheral edema.  +2 pedal and radial pulses bilaterally.  NEUROLOGIC: The patient is alert, awake, and oriented x3 with no focal motor or sensory deficits appreciated bilaterally.  SKIN: Moist and warm with spider angioma Psych: Not anxious, depressed LN: No inguinal LN enlargement    Antibiotics   Anti-infectives    Start     Dose/Rate Route Frequency Ordered Stop   05/14/15 1700  cefTRIAXone (ROCEPHIN) 2 g in dextrose 5 % 50 mL IVPB - Premix     2 g 100 mL/hr over 30 Minutes Intravenous Every 24 hours 05/14/15 0023     05/14/15 0600  vancomycin (VANCOCIN) 1,500 mg in sodium chloride 0.9 % 500 mL IVPB     1,500 mg 250 mL/hr over 120 Minutes Intravenous Every 12 hours 05/14/15 0023     05/13/15 1700  cefTRIAXone (ROCEPHIN) 2 g in dextrose 5 % 50 mL IVPB - Premix     2 g 100 mL/hr over 30 Minutes Intravenous STAT 05/13/15 1653 05/13/15 1751   05/13/15 1700  vancomycin (VANCOCIN) 2,000 mg in sodium chloride 0.9 % 500 mL IVPB     2,000 mg 250 mL/hr over 120 Minutes Intravenous  Once 05/13/15 1653 05/13/15 2014      Medications   Scheduled Meds: . cefTRIAXone (ROCEPHIN)  IV  2 g Intravenous Q24H  . diltiazem  60 mg Oral 3 times per day  . folic acid  1 mg Oral Daily  .  insulin aspart  0-5 Units Subcutaneous QHS  . insulin aspart  0-9 Units Subcutaneous TID WC  . lactulose  30 g Oral BID  . multivitamin with minerals  1 tablet Oral Daily  . thiamine  100 mg Oral Daily  . vancomycin  1,500 mg Intravenous Q12H   Continuous Infusions:  PRN Meds:.LORazepam, ondansetron **OR** ondansetron (ZOFRAN) IV   Data Review:   Micro Results Recent Results (from the past 240 hour(s))  MRSA PCR Screening     Status: None   Collection Time: 05/13/15  2:26 PM  Result Value Ref Range Status   MRSA by PCR NEGATIVE NEGATIVE Final    Comment:        The GeneXpert MRSA Assay (FDA approved for NASAL specimens only), is one component of a comprehensive MRSA colonization surveillance program. It is not intended to diagnose MRSA infection nor to guide or monitor treatment for MRSA infections.   Blood culture (routine x 2)     Status: None (Preliminary result)   Collection Time: 05/13/15  4:30 PM  Result Value Ref Range Status   Specimen Description BLOOD  Final   Special Requests BLOOD CULTURE RECEIVED  Final   Culture NO GROWTH 2 DAYS  Final   Report Status PENDING  Incomplete  Blood culture (routine x 2)     Status: None (Preliminary result)   Collection Time: 05/13/15  4:35 PM  Result Value Ref Range Status   Specimen Description BLOOD  Final   Special Requests BLOOD CULTURE RECEIVED  Final   Culture NO GROWTH 2 DAYS  Final   Report Status PENDING  Incomplete  C difficile quick scan w PCR reflex [facility])     Status: None   Collection Time: 05/14/15  5:30 PM  Result Value Ref Range Status   C Diff antigen POSITIVE  Final   C Diff toxin NEGATIVE  Final   C Diff interpretation   Final    Positive for toxigenic C. difficile, active toxin production not detected. Patient has toxigenic C. difficile organisms present in the bowel, but toxin was not detected. The patient may be a carrier or the level of toxin in the sample was below the limit  of detection. This  information should be used in conjunction with the patient's clinical history when deciding on possible therapy.     Comment: CRITICAL RESULT CALLED TO, READ BACK BY AND VERIFIED WITH: AMBER THOMAS AT 2136 05/14/15 CTJ   Clostridium Difficile by PCR (not at [facility])     Status: None   Collection Time: 05/14/15  5:30 PM  Result Value Ref Range Status   C difficile by pcr Toxigenic C.diff POSITIVE NEGATIVE Final    Radiology Reports US Abdomen Limited  05/15/2015   CLINICAL DATA:  Evaluate for ascites  EXAM: LIMITED ABDOMINAL ULTRASOUND  COMPARISON:  None.  FINDINGS: Only a very trace amount of ascites is present in the right and left lower quadrants. The bladder is distended.  IMPRESSION: Trace ascites is present.  Paracentesis was not performed.  Bladder is distended.   Electronically Signed   By: Marybelle Killings M.D.   On: 05/15/2015 11:43   Dg Chest Portable 1 View  05/13/2015   CLINICAL DATA:  Generalized weakness today.  EXAM: PORTABLE CHEST - 1 VIEW  COMPARISON:  Single view of the chest 02/11/2014 and 02/06/2014.  FINDINGS: The lungs are clear. Heart size is normal. There is no pneumothorax or pleural fluid. No focal bony abnormality is identified.  IMPRESSION: Negative chest.   Electronically Signed   By: Inge Rise M.D.   On: 05/13/2015 17:43     CBC  Recent Labs Lab 05/13/15 1506 05/14/15 0421  WBC 28.6* 20.4*  HGB 10.5* 9.5*  HCT 31.6* 28.5*  PLT 161 118*  MCV 96.2 96.1  MCH 31.9 31.9  MCHC 33.2 33.3  RDW 17.7* 17.4*  LYMPHSABS 0.7*  --   MONOABS 0.7  --   EOSABS 0.0  --   BASOSABS 0.1  --     Chemistries   Recent Labs Lab 05/13/15 1506 05/13/15 1521 05/14/15 0421 05/15/15 0541  NA 130*  --  131* 136  K 3.0*  --  3.1* 2.4*  CL 89*  --  95* 101  CO2 20*  --  27 27  GLUCOSE 232*  --  116* 91  BUN 15  --  16 12  CREATININE 0.73  --  0.55* 0.58*  CALCIUM 7.0*  --  6.6* 6.8*  MG  --  1.4*  --  1.3*  AST 58*  --   --   --   ALT 17  --   --   --   ALKPHOS  303*  --   --   --   BILITOT 4.0*  --   --   --    ------------------------------------------------------------------------------------------------------------------ estimated creatinine clearance is 138.2 mL/min (by C-G formula based on Cr of 0.58). ------------------------------------------------------------------------------------------------------------------  Recent Labs  05/13/15 1521  HGBA1C 5.2   ------------------------------------------------------------------------------------------------------------------ No results for input(s): CHOL, HDL, LDLCALC, TRIG, CHOLHDL, LDLDIRECT in the last 72 hours. ------------------------------------------------------------------------------------------------------------------ No results for input(s): TSH, T4TOTAL, T3FREE, THYROIDAB in the last 72 hours.  Invalid input(s): FREET3 ------------------------------------------------------------------------------------------------------------------ No results for input(s): VITAMINB12, FOLATE, FERRITIN, TIBC, IRON, RETICCTPCT in the last 72 hours.  Coagulation profile  Recent Labs Lab 05/13/15 1506  INR 1.23    No results  for input(s): DDIMER in the last 72 hours.  Cardiac Enzymes  Recent Labs Lab 05/13/15 1506  TROPONINI 0.04*   ------------------------------------------------------------------------------------------------------------------ Invalid input(s): POCBNP    Assessment & Plan   #1 sepsis: Concern for s spontaneous bacterial peritonitis, continue Rocephin and vancomycin for now. paracentesis today. #2 alcohol withdrawal with seizure activity: No evidence of further seizures continue ciwa protocol.   #3 high anion gap metabolic acidosis with high lactate: Likely due to sepsis.  Now resolved  #4 hyperglycemia: I do not see diabetes on his past medical history. Possible reactive hemoglobin A1c currently pending   #5 hypokalemia:still low,continue to replace. #6  hyponatremia;;improved  #7 sinus tachycardia: Related to sepsis alcohol withdrawal ,on Cardizem.     Code Status Orders        Start     Ordered   05/13/15 2055  Full code   Continuous     05/13/15 2054           Consults    DVT Prophylaxis  SCDs   Lab Results  Component Value Date   PLT 118* 05/14/2015     Time Spent in minutes  37min     Tyrell Brereton M.D on 05/15/2015 at 11:49 AM  Between 7am to 6pm - Pager - [phone]  After 6pm go to [email or web address] - password [email or web address] [facility]   Office  [phone]

## 2015-05-15 NOTE — Progress Notes (Signed)
Notified Dr. Vianne Bulls of bladder scan results. Order for in and out cath.

## 2015-05-16 LAB — BASIC METABOLIC PANEL
Anion gap: 8 (ref 5–15)
Anion gap: 4 — ABNORMAL LOW (ref 5–15)
BUN: 12 mg/dL (ref 6–20)
BUN: 7 mg/dL (ref 6–20)
CHLORIDE: 108 mmol/L (ref 101–111)
CO2: 27 mmol/L (ref 22–32)
CO2: 23 mmol/L (ref 22–32)
Calcium: 6.8 mg/dL — ABNORMAL LOW (ref 8.9–10.3)
Calcium: 6.9 mg/dL — ABNORMAL LOW (ref 8.9–10.3)
Chloride: 101 mmol/L (ref 101–111)
Creatinine, Ser: 0.58 mg/dL — ABNORMAL LOW (ref 0.61–1.24)
Creatinine, Ser: 0.6 mg/dL — ABNORMAL LOW (ref 0.61–1.24)
GLUCOSE: 91 mg/dL (ref 65–99)
GLUCOSE: 90 mg/dL (ref 65–99)
POTASSIUM: 2.4 mmol/L — AB (ref 3.5–5.1)
Potassium: 3 mmol/L — ABNORMAL LOW (ref 3.5–5.1)
Sodium: 136 mmol/L (ref 135–145)
Sodium: 135 mmol/L (ref 135–145)

## 2015-05-16 LAB — GLUCOSE, CAPILLARY
GLUCOSE-CAPILLARY: 90 mg/dL (ref 65–99)
GLUCOSE-CAPILLARY: 95 mg/dL (ref 65–99)
Glucose-Capillary: 92 mg/dL (ref 65–99)
Glucose-Capillary: 113 mg/dL — ABNORMAL HIGH (ref 65–99)

## 2015-05-16 LAB — TSH: TSH: 9.819 u[IU]/mL — ABNORMAL HIGH (ref 0.350–4.500)

## 2015-05-16 LAB — VANCOMYCIN, TROUGH: Vancomycin Tr: 34 ug/mL (ref 10.0–20.0)

## 2015-05-16 LAB — VANCOMYCIN, RANDOM: Vancomycin Rm: 18 ug/mL

## 2015-05-16 MED ORDER — RIFAXIMIN 200 MG PO TABS
200.0000 mg | ORAL_TABLET | Freq: Three times a day (TID) | ORAL | Status: DC
  Administered 2015-05-16 – 2015-05-17 (×4): 200 mg via ORAL
  Filled 2015-05-16 (×5): qty 1

## 2015-05-16 MED ORDER — LACTULOSE 10 GM/15ML PO SOLN
10.0000 g | Freq: Every day | ORAL | Status: DC
  Filled 2015-05-16: qty 30

## 2015-05-16 MED ORDER — LEVOTHYROXINE SODIUM 25 MCG PO TABS
150.0000 ug | ORAL_TABLET | Freq: Every day | ORAL | Status: DC
  Administered 2015-05-17: 150 ug via ORAL
  Filled 2015-05-16: qty 1

## 2015-05-16 MED ORDER — POTASSIUM CHLORIDE CRYS ER 20 MEQ PO TBCR
40.0000 meq | EXTENDED_RELEASE_TABLET | Freq: Every day | ORAL | Status: AC
  Administered 2015-05-16 (×2): 40 meq via ORAL
  Filled 2015-05-16 (×2): qty 2

## 2015-05-16 MED ORDER — POTASSIUM CHLORIDE 10 MEQ/100ML IV SOLN
10.0000 meq | INTRAVENOUS | Status: AC
  Administered 2015-05-16 (×2): 10 meq via INTRAVENOUS
  Filled 2015-05-16 (×4): qty 100

## 2015-05-16 MED ORDER — NADOLOL 20 MG PO TABS
40.0000 mg | ORAL_TABLET | Freq: Two times a day (BID) | ORAL | Status: DC
  Administered 2015-05-16 – 2015-05-17 (×3): 40 mg via ORAL
  Filled 2015-05-16 (×3): qty 2

## 2015-05-16 MED ORDER — FUROSEMIDE 40 MG PO TABS
40.0000 mg | ORAL_TABLET | Freq: Every day | ORAL | Status: DC
  Administered 2015-05-16 – 2015-05-17 (×2): 40 mg via ORAL
  Filled 2015-05-16 (×2): qty 1

## 2015-05-16 MED ORDER — SPIRONOLACTONE 25 MG PO TABS
100.0000 mg | ORAL_TABLET | Freq: Every day | ORAL | Status: DC
  Administered 2015-05-16 – 2015-05-17 (×2): 100 mg via ORAL
  Filled 2015-05-16 (×2): qty 4

## 2015-05-16 MED ORDER — QUETIAPINE FUMARATE 100 MG PO TABS
100.0000 mg | ORAL_TABLET | Freq: Every evening | ORAL | Status: DC | PRN
  Administered 2015-05-16: 100 mg via ORAL
  Filled 2015-05-16: qty 1

## 2015-05-16 NOTE — Progress Notes (Signed)
ANTIBIOTIC CONSULT NOTE - FOLLOW UP s Pharmacy Consult for Vancomycin and Ceftriaxone Indication: rule out sepsis  No Known Allergies  Patient Measurements: Height: 5\' 8"  (172.7 cm) Weight: 230 lb 9.6 oz (104.6 kg) IBW/kg (Calculated) : 68.4  Vital Signs: Temp: 97.9 F (36.6 C) (06/01 0644) Temp Source: Oral (06/01 0644) BP: 111/65 mmHg (06/01 1053) Pulse Rate: 92 (06/01 1053) Intake/Output from previous day: 05/31 0701 - 06/01 0700 In: 802 [P.O.:358; IV Piggyback:444] Out: 500 [Urine:500] Intake/Output from this shift: Total I/O In: 365 [IV Piggyback:365] Out: 0   Labs:  Recent Labs  05/13/15 1506 05/14/15 0421 05/15/15 0541 05/15/15 1253 05/15/15 1907 05/16/15 0507  WBC 28.6* 20.4*  --  13.3*  --   --   HGB 10.5* 9.5*  --  8.1*  --   --   PLT 161 118*  --  101*  --   --   CREATININE 0.73 0.55* 0.58*  --  0.80 0.60*   Estimated Creatinine Clearance: 138.2 mL/min (by C-G formula based on Cr of 0.6).  Recent Labs  05/16/15 1014  [place] 34*     Microbiology: Recent Results (from the past 720 hour(s))  MRSA PCR Screening     Status: None   Collection Time: 05/13/15  2:26 PM  Result Value Ref Range Status   MRSA by PCR NEGATIVE NEGATIVE Final    Comment:        The GeneXpert MRSA Assay (FDA approved for NASAL specimens only), is one component of a comprehensive MRSA colonization surveillance program. It is not intended to diagnose MRSA infection nor to guide or monitor treatment for MRSA infections.   Blood culture (routine x 2)     Status: None (Preliminary result)   Collection Time: 05/13/15  4:30 PM  Result Value Ref Range Status   Specimen Description BLOOD  Final   Special Requests BLOOD CULTURE RECEIVED  Final   Culture NO GROWTH 2 DAYS  Final   Report Status PENDING  Incomplete  Blood culture (routine x 2)     Status: None (Preliminary result)   Collection Time: 05/13/15  4:35 PM  Result Value Ref Range Status   Specimen Description  BLOOD  Final   Special Requests BLOOD CULTURE RECEIVED  Final   Culture NO GROWTH 2 DAYS  Final   Report Status PENDING  Incomplete  C difficile quick scan w PCR reflex [facility])     Status: None   Collection Time: 05/14/15  5:30 PM  Result Value Ref Range Status   C Diff antigen POSITIVE  Final   C Diff toxin NEGATIVE  Final   C Diff interpretation   Final    Positive for toxigenic C. difficile, active toxin production not detected. Patient has toxigenic C. difficile organisms present in the bowel, but toxin was not detected. The patient may be a carrier or the level of toxin in the sample was below the limit  of detection. This information should be used in conjunction with the patient's clinical history when deciding on possible therapy.     Comment: CRITICAL RESULT CALLED TO, READ BACK BY AND VERIFIED WITH: AMBER THOMAS AT 2136 05/14/15 CTJ   Clostridium Difficile by PCR (not at [facility])     Status: Abnormal   Collection Time: 05/14/15  5:30 PM  Result Value Ref Range Status   C difficile by pcr POSITIVE (A) NEGATIVE Corrected    Comment: CORRECTED ON 05/31 AT 1519: PREVIOUSLY REPORTED AS Toxigenic C.diff POSITIVE  Anti-infectives    Start     Dose/Rate Route Frequency Ordered Stop   05/16/15 1000  rifaximin (XIFAXAN) tablet 200 mg     200 mg Oral 3 times daily 05/16/15 0905     05/15/15 1800  metroNIDAZOLE (FLAGYL) tablet 500 mg     500 mg Oral 4 times per day 05/15/15 1505     05/14/15 1700  cefTRIAXone (ROCEPHIN) 2 g in dextrose 5 % 50 mL IVPB - Premix     2 g 100 mL/hr over 30 Minutes Intravenous Every 24 hours 05/14/15 0023     05/14/15 0600  vancomycin (VANCOCIN) 1,500 mg in sodium chloride 0.9 % 500 mL IVPB  Status:  Discontinued     1,500 mg 250 mL/hr over 120 Minutes Intravenous Every 12 hours 05/14/15 0023 05/16/15 1203   05/13/15 1700  cefTRIAXone (ROCEPHIN) 2 g in dextrose 5 % 50 mL IVPB - Premix     2 g 100 mL/hr over 30 Minutes Intravenous STAT 05/13/15 1653 05/13/15  1751   05/13/15 1700  vancomycin (VANCOCIN) 2,000 mg in sodium chloride 0.9 % 500 mL IVPB     2,000 mg 250 mL/hr over 120 Minutes Intravenous  Once 05/13/15 1653 05/13/15 2014      Assessment: Patient on vancomycin and ceftriaxone for r/o sepsis/SBP prophylaxis to change to Cipro tomorrow per MD note. Patient also C. Diff positive on metronidazole po.  Blood cx: NGTD x 2. Vancomycin trough of 34 is supratherapeutic.   Goal of Therapy:  Vancomycin trough level 15-20 mcg/ml  Plan:  1. Continue ceftriaxone 2 g iv q 24 h.   2. Will stop vancomycin and check a random level this pm and redose based on that.   Ulice Dash D 05/16/2015,12:07 PM

## 2015-05-16 NOTE — Progress Notes (Signed)
Spoke with Dr. Vianne Bulls to discontinue IV potassium and add two doses of 40 meq one now and one in 4 hours.

## 2015-05-16 NOTE — Clinical Social Work Note (Signed)
Patient seen by psychiatry after CSW assessment and psychiatry documenting that patient is refusing any rehab options for his ETOH abuse at this time.  Shela Leff MSW,LCSWA [phone]

## 2015-05-16 NOTE — Progress Notes (Signed)
[place] at [facility]                                                                                                                                                                                            Patient Demographics   Troy [name], is a 44 y.o. male, DOB - 10/23/1971, FTD:[id number]  Admit date - 05/13/2015   Admitting Physician Aldean Jewett, MD  Outpatient Primary MD for the patient is No primary care provider on file.   LOS - 3  Subjective: unable to get paracentesis  Yesterday.has some diarrhea due to lactulose.no other complaints.still K is low.says he has court date tomorrow and wants letter faxed over.afebrile,  Review of Systems:   CONSTITUTIONAL: No documented fever. No fatigue, weakness. No weight gain, no weight loss.  EYES: No blurry or double vision.  ENT: No tinnitus. No postnasal drip. No redness of the oropharynx.  RESPIRATORY: No cough, no wheeze, no hemoptysis. No dyspnea.  CARDIOVASCULAR: No chest pain. No orthopnea. No palpitations. No syncope.  GASTROINTESTINAL: No nausea, no vomiting or diarrhea. No abdominal pain. No melena or hematochezia.  GENITOURINARY: No dysuria or hematuria.  ENDOCRINE: No polyuria or nocturia. No heat or cold intolerance.  HEMATOLOGY: No anemia. No bruising. No bleeding.  INTEGUMENTARY: No rashes. No lesions.  MUSCULOSKELETAL: No arthritis. No swelling. No gout.  NEUROLOGIC: No numbness, tingling, or ataxia. No seizure-type activity.  PSYCHIATRIC: No anxiety. No insomnia. No ADD.    Vitals:   Filed Vitals:   05/15/15 1424 05/15/15 1718 05/16/15 0001 05/16/15 0644  BP: 113/59 120/68 110/66 116/63  Pulse:  103 102 96  Temp:  98 F (36.7 C) 98.5 F (36.9 C) 97.9 F (36.6 C)  TempSrc:  Oral Oral Oral  Resp:  20 18 18   Height:      Weight:      SpO2:  100% 100% 99%    Wt Readings from Last 3 Encounters:  05/14/15 104.6 kg (230 lb 9.6 oz)     Intake/Output Summary (Last 24  hours) at 05/16/15 0908 Last data filed at 05/16/15 0715  Gross per 24 hour  Intake   1157 ml  Output    500 ml  Net    657 ml    Physical Exam:   GENERAL: Disheveled in no apparent distress.  HEAD, EYES, EARS, NOSE AND THROAT: Atraumatic, normocephalic. Extraocular muscles are intact. Pupils equal and reactive to light. Sclerae anicteric. No conjunctival injection. No oro-pharyngeal erythema.  NECK: Supple. There is no jugular venous distention. No bruits, no lymphadenopathy, no thyromegaly.  HEART: Regular rate and rhythm, tachycardic.  No murmurs, no rubs, no clicks.  LUNGS: Clear to auscultation bilaterally. No rales or rhonchi. No wheezes.  ABDOMEN: Soft, distended nontender, Has good bowel sounds. No hepatosplenomegaly appreciated.  EXTREMITIES: No evidence of any cyanosis, clubbing, or peripheral edema.  +2 pedal and radial pulses bilaterally.  NEUROLOGIC: The patient is alert, awake, and oriented x3 with no focal motor or sensory deficits appreciated bilaterally.  SKIN: Moist and warm with spider angioma Psych: Not anxious, depressed LN: No inguinal LN enlargement    Antibiotics   Anti-infectives    Start     Dose/Rate Route Frequency Ordered Stop   05/16/15 1000  rifaximin (XIFAXAN) tablet 200 mg     200 mg Oral 3 times daily 05/16/15 0905     05/15/15 1800  metroNIDAZOLE (FLAGYL) tablet 500 mg     500 mg Oral 4 times per day 05/15/15 1505     05/14/15 1700  cefTRIAXone (ROCEPHIN) 2 g in dextrose 5 % 50 mL IVPB - Premix     2 g 100 mL/hr over 30 Minutes Intravenous Every 24 hours 05/14/15 0023     05/14/15 0600  vancomycin (VANCOCIN) 1,500 mg in sodium chloride 0.9 % 500 mL IVPB     1,500 mg 250 mL/hr over 120 Minutes Intravenous Every 12 hours 05/14/15 0023     05/13/15 1700  cefTRIAXone (ROCEPHIN) 2 g in dextrose 5 % 50 mL IVPB - Premix     2 g 100 mL/hr over 30 Minutes Intravenous STAT 05/13/15 1653 05/13/15 1751   05/13/15 1700  vancomycin (VANCOCIN) 2,000 mg in  sodium chloride 0.9 % 500 mL IVPB     2,000 mg 250 mL/hr over 120 Minutes Intravenous  Once 05/13/15 1653 05/13/15 2014      Medications   Scheduled Meds: . cefTRIAXone (ROCEPHIN)  IV  2 g Intravenous Q24H  . diltiazem  60 mg Oral 3 times per day  . folic acid  1 mg Oral Daily  . insulin aspart  0-5 Units Subcutaneous QHS  . insulin aspart  0-9 Units Subcutaneous TID WC  . lactulose  10 g Oral Daily  . metroNIDAZOLE  500 mg Oral 4 times per day  . multivitamin with minerals  1 tablet Oral Daily  . potassium chloride  10 mEq Intravenous Q1 Hr x 4  . rifaximin  200 mg Oral TID  . thiamine  100 mg Oral Daily  . vancomycin  1,500 mg Intravenous Q12H   Continuous Infusions:  PRN Meds:.LORazepam, ondansetron **OR** ondansetron (ZOFRAN) IV   Data Review:   Micro Results Recent Results (from the past 240 hour(s))  MRSA PCR Screening     Status: None   Collection Time: 05/13/15  2:26 PM  Result Value Ref Range Status   MRSA by PCR NEGATIVE NEGATIVE Final    Comment:        The GeneXpert MRSA Assay (FDA approved for NASAL specimens only), is one component of a comprehensive MRSA colonization surveillance program. It is not intended to diagnose MRSA infection nor to guide or monitor treatment for MRSA infections.   Blood culture (routine x 2)     Status: None (Preliminary result)   Collection Time: 05/13/15  4:30 PM  Result Value Ref Range Status   Specimen Description BLOOD  Final   Special Requests BLOOD CULTURE RECEIVED  Final   Culture NO GROWTH 2 DAYS  Final   Report Status PENDING  Incomplete  Blood culture (routine x 2)     Status:  None (Preliminary result)   Collection Time: 05/13/15  4:35 PM  Result Value Ref Range Status   Specimen Description BLOOD  Final   Special Requests BLOOD CULTURE RECEIVED  Final   Culture NO GROWTH 2 DAYS  Final   Report Status PENDING  Incomplete  C difficile quick scan w PCR reflex [facility])     Status: None   Collection Time:  05/14/15  5:30 PM  Result Value Ref Range Status   C Diff antigen POSITIVE  Final   C Diff toxin NEGATIVE  Final   C Diff interpretation   Final    Positive for toxigenic C. difficile, active toxin production not detected. Patient has toxigenic C. difficile organisms present in the bowel, but toxin was not detected. The patient may be a carrier or the level of toxin in the sample was below the limit  of detection. This information should be used in conjunction with the patient's clinical history when deciding on possible therapy.     Comment: CRITICAL RESULT CALLED TO, READ BACK BY AND VERIFIED WITH: AMBER THOMAS AT 2136 05/14/15 CTJ   Clostridium Difficile by PCR (not at [facility])     Status: Abnormal   Collection Time: 05/14/15  5:30 PM  Result Value Ref Range Status   C difficile by pcr POSITIVE (A) NEGATIVE Corrected    Comment: CORRECTED ON 05/31 AT 1519: PREVIOUSLY REPORTED AS Toxigenic C.diff POSITIVE    Radiology Reports US Abdomen Limited  05/15/2015   CLINICAL DATA:  Evaluate for ascites  EXAM: LIMITED ABDOMINAL ULTRASOUND  COMPARISON:  None.  FINDINGS: Only a very trace amount of ascites is present in the right and left lower quadrants. The bladder is distended.  IMPRESSION: Trace ascites is present.  Paracentesis was not performed.  Bladder is distended.   Electronically Signed   By: Marybelle Killings M.D.   On: 05/15/2015 11:43   Dg Chest Portable 1 View  05/13/2015   CLINICAL DATA:  Generalized weakness today.  EXAM: PORTABLE CHEST - 1 VIEW  COMPARISON:  Single view of the chest 02/11/2014 and 02/06/2014.  FINDINGS: The lungs are clear. Heart size is normal. There is no pneumothorax or pleural fluid. No focal bony abnormality is identified.  IMPRESSION: Negative chest.   Electronically Signed   By: Inge Rise M.D.   On: 05/13/2015 17:43     CBC  Recent Labs Lab 05/13/15 1506 05/14/15 0421 05/15/15 1253  WBC 28.6* 20.4* 13.3*  HGB 10.5* 9.5* 8.1*  HCT 31.6* 28.5* 25.4*   PLT 161 118* 101*  MCV 96.2 96.1 98.9  MCH 31.9 31.9 31.6  MCHC 33.2 33.3 31.9*  RDW 17.7* 17.4* 18.0*  LYMPHSABS 0.7*  --   --   MONOABS 0.7  --   --   EOSABS 0.0  --   --   BASOSABS 0.1  --   --     Chemistries   Recent Labs Lab 05/13/15 1506 05/13/15 1521 05/14/15 0421 05/15/15 0541 05/15/15 1253 05/15/15 1907 05/16/15 0507  NA 130*  --  131* 136  --  137 135  K 3.0*  --  3.1* 2.4* 2.7* 2.9* 3.0*  CL 89*  --  95* 101  --  107 108  CO2 20*  --  27 27  --  22 23  GLUCOSE 232*  --  116* 91  --  134* 90  BUN 15  --  16 12  --  9 7  CREATININE 0.73  --  0.55*  0.58*  --  0.80 0.60*  CALCIUM 7.0*  --  6.6* 6.8*  --  6.8* 6.9*  MG  --  1.4*  --  1.3*  --   --   --   AST 58*  --   --   --   --   --   --   ALT 17  --   --   --   --   --   --   ALKPHOS 303*  --   --   --   --   --   --   BILITOT 4.0*  --   --   --   --   --   --    ------------------------------------------------------------------------------------------------------------------ estimated creatinine clearance is 138.2 mL/min (by C-G formula based on Cr of 0.6). ------------------------------------------------------------------------------------------------------------------  Recent Labs  05/13/15 1521  HGBA1C 5.2   ------------------------------------------------------------------------------------------------------------------ No results for input(s): CHOL, HDL, LDLCALC, TRIG, CHOLHDL, LDLDIRECT in the last 72 hours. ------------------------------------------------------------------------------------------------------------------ No results for input(s): TSH, T4TOTAL, T3FREE, THYROIDAB in the last 72 hours.  Invalid input(s): FREET3 ------------------------------------------------------------------------------------------------------------------ No results for input(s): VITAMINB12, FOLATE, FERRITIN, TIBC, IRON, RETICCTPCT in the last 72 hours.  Coagulation profile  Recent Labs Lab 05/13/15 1506   INR 1.23    No results for input(s): DDIMER in the last 72 hours.  Cardiac Enzymes  Recent Labs Lab 05/13/15 1506  TROPONINI 0.04*   ------------------------------------------------------------------------------------------------------------------ Invalid input(s): POCBNP    Assessment & Plan   #1 sepsis: Concern for s spontaneous bacterial peritonitis, continue Rocephin and vancomycin ,unable to get paracentesis as fluid  Is minimal,wbc is trnding down,plan to continue IV abx today,change to PO Cipro tomorrow. #2 alcohol withdrawal with seizure activity: No evidence of further seizures continue ciwa protocol.   #3 high anion gap metabolic acidosis with high lactate: Likely due to sepsis.  Now resolved  #4 hyperglycemia: I do not see diabetes on his past medical history. Possible reactive hemoglobin A1c currently pending   #5 hypokalemia:still low,continue to replace.recheck #6 hyponatremia;;improved  #7 sinus tachycardia: Related to sepsis alcohol withdrawal ,on Cardizem. 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 9.Deconditioning;PT eval today    Code Status Orders        Start     Ordered   05/13/15 2055  Full code   Continuous     05/13/15 2054           Consults    DVT Prophylaxis  SCDs   Lab Results  Component Value Date   PLT 101* 05/15/2015     Time Spent in minutes  41min     Tomoki Lucken M.D on 05/16/2015 at 9:08 AM  Between 7am to 6pm - Pager - [phone]  After 6pm go to [email or web address] - password [email or web address] [facility]   Office  [phone]

## 2015-05-16 NOTE — Evaluation (Signed)
Physical Therapy Evaluation Patient Details Name: Troy [name] MRN: [id number] DOB: 1971-08-24 Today's Date: 05/16/2015   History of Present Illness  44 yo male with onset of sepsis and EtOH withdrawal symptoms has been referred to PT for ck of functional level.  Pt has cirrhosis and was attempted to have paracentesis, but no success.    Clinical Impression  Pt was seen for assessment of mobility after having trouble with being able to help move with nursing.  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  He was encouraged to go on a schedule and has agreed.  Will need to sit up in chair and work on standing/gait with RW to progress toward SNF and home.  Will not have his wife at home to help as she is a Pharmacist, hospital as well.    Follow Up Recommendations SNF    Equipment Recommendations  None recommended by PT    Recommendations for Other Services       Precautions / Restrictions Precautions Precautions: Fall (telemetry) Restrictions Weight Bearing Restrictions: Yes      Mobility  Bed Mobility Overal bed mobility: Needs Assistance Bed Mobility: Sidelying to Sit   Sidelying to sit: Min assist;Mod assist       General bed mobility comments: pt can maneuver on bed x side to sit at which pt PT helped, and used trendelenburg for scooting up bed  Transfers Overall transfer level: Needs assistance Equipment used: Rolling walker (2 wheeled);1 person hand held assist Transfers: Sit to/from Stand Sit to Stand: Mod assist         General transfer comment: reminders for hand placement and help for scooting up bed wiht bed pad  Ambulation/Gait             General Gait Details: unable to do more than attempt sidestepping  Stairs            Wheelchair Mobility    Modified Rankin (Stroke Patients Only)       Balance Overall balance assessment: Needs assistance Sitting-balance support: Feet supported Sitting balance-Leahy Scale: Fair    Postural control: Posterior lean Standing balance support: Bilateral upper extremity supported Standing balance-Leahy Scale: Poor                               Pertinent Vitals/Pain Pain Assessment: No/denies pain    Home Living Family/patient expects to be discharged to:: Private residence Living Arrangements: Spouse/significant other Available Help at Discharge: Family;Available PRN/intermittently Type of Home: House Home Access: Ramped entrance     Home Layout: One level Home Equipment: Walker - 2 wheels;Walker - 4 wheels;Cane - quad;Cane - single point;Shower seat      Prior Function Level of Independence: Independent with assistive device(s)         Comments: previously working as Oncologist        Extremity/Trunk Assessment   Upper Extremity Assessment: Overall WFL for tasks assessed           Lower Extremity Assessment: Generalized weakness      Cervical / Trunk Assessment: Normal  Communication   Communication: No difficulties  Cognition Arousal/Alertness: Awake/alert Behavior During Therapy: WFL for tasks assessed/performed Overall Cognitive Status: Within Functional Limits for tasks assessed                      General Comments General comments (skin integrity,  edema, etc.): Needs help with incontinence issue which is new.  Asked pt to go on a toileting schedule with nursing and he agreed.    Exercises        Assessment/Plan    PT Assessment Patient needs continued PT services  PT Diagnosis Generalized weakness   PT Problem List Decreased strength;Decreased range of motion;Decreased activity tolerance;Decreased balance;Decreased mobility;Decreased coordination;Cardiopulmonary status limiting activity;Obesity  PT Treatment Interventions DME instruction;Gait training;Functional mobility training;Therapeutic activities;Therapeutic exercise;Balance training;Neuromuscular  re-education;Patient/family education   PT Goals (Current goals can be found in the Care Plan section) Acute Rehab PT Goals Patient Stated Goal: to get home PT Goal Formulation: With patient Time For Goal Achievement: 05/30/15 Potential to Achieve Goals: Good    Frequency Min 2X/week   Barriers to discharge Inaccessible home environment;Decreased caregiver support Wife is teaching     Co-evaluation               End of Session   Activity Tolerance: Patient tolerated treatment well;Patient limited by fatigue Patient left: in bed;with call bell/phone within reach;with bed alarm set Nurse Communication: Mobility status         Time: 4259-5638 PT Time Calculation (min) (ACUTE ONLY): 32 min   Charges:   PT Evaluation $Initial PT Evaluation Tier I: 1 Procedure PT Treatments $Therapeutic Activity: 8-22 mins   PT G Codes:        Ramond Dial 2015/06/03, 6:39 PM   Mee Hives, PT MS Acute Rehab Dept. Number: [phone] [phone] and [place] [phone]

## 2015-05-16 NOTE — Progress Notes (Signed)
Spoke with Dr. Vianne Bulls about order for seroquel 100mg  prn at bedtime, order placed

## 2015-05-17 LAB — BASIC METABOLIC PANEL
Anion gap: 5 (ref 5–15)
BUN: 6 mg/dL (ref 6–20)
CHLORIDE: 110 mmol/L (ref 101–111)
CO2: 24 mmol/L (ref 22–32)
Calcium: 6.9 mg/dL — ABNORMAL LOW (ref 8.9–10.3)
Creatinine, Ser: 0.74 mg/dL (ref 0.61–1.24)
GLUCOSE: 89 mg/dL (ref 65–99)
POTASSIUM: 2.8 mmol/L — AB (ref 3.5–5.1)
Sodium: 139 mmol/L (ref 135–145)

## 2015-05-17 LAB — GLUCOSE, CAPILLARY
GLUCOSE-CAPILLARY: 87 mg/dL (ref 65–99)
Glucose-Capillary: 87 mg/dL (ref 65–99)

## 2015-05-17 MED ORDER — FOLIC ACID 1 MG PO TABS: 1.0000 mg | ORAL_TABLET | Freq: Every day | ORAL | Status: AC

## 2015-05-17 MED ORDER — FUROSEMIDE 40 MG PO TABS: 40.0000 mg | ORAL_TABLET | Freq: Every day | ORAL | Status: AC

## 2015-05-17 MED ORDER — POTASSIUM CHLORIDE CRYS ER 20 MEQ PO TBCR
40.0000 meq | EXTENDED_RELEASE_TABLET | Freq: Two times a day (BID) | ORAL | Status: DC
  Administered 2015-05-17: 40 meq via ORAL
  Filled 2015-05-17 (×2): qty 2

## 2015-05-17 MED ORDER — SODIUM CHLORIDE 0.9 % IV SOLN
1500.0000 mg | INTRAVENOUS | Status: AC
  Administered 2015-05-17: 1500 mg via INTRAVENOUS
  Filled 2015-05-17: qty 1500

## 2015-05-17 MED ORDER — VANCOMYCIN HCL 10 G IV SOLR
1500.0000 mg | INTRAVENOUS | Status: DC
  Filled 2015-05-17: qty 1500

## 2015-05-17 MED ORDER — METRONIDAZOLE 500 MG PO TABS: 500.0000 mg | ORAL_TABLET | Freq: Two times a day (BID) | ORAL | Status: DC

## 2015-05-17 MED ORDER — THIAMINE HCL 100 MG PO TABS: 100.0000 mg | ORAL_TABLET | Freq: Every day | ORAL | Status: AC

## 2015-05-17 MED ORDER — POTASSIUM CHLORIDE CRYS ER 20 MEQ PO TBCR
40.0000 meq | EXTENDED_RELEASE_TABLET | Freq: Two times a day (BID) | ORAL | Status: DC
  Administered 2015-05-17: 40 meq via ORAL

## 2015-05-17 MED ORDER — POTASSIUM CHLORIDE CRYS ER 20 MEQ PO TBCR: 40.0000 meq | EXTENDED_RELEASE_TABLET | Freq: Two times a day (BID) | ORAL | Status: AC

## 2015-05-17 MED ORDER — POTASSIUM CHLORIDE 10 MEQ/100ML IV SOLN
10.0000 meq | INTRAVENOUS | Status: AC
  Administered 2015-05-17: 10 meq via INTRAVENOUS
  Filled 2015-05-17 (×4): qty 100

## 2015-05-17 MED ORDER — NADOLOL 40 MG PO TABS: 40.0000 mg | ORAL_TABLET | Freq: Every day | ORAL | Status: AC

## 2015-05-17 MED ORDER — LEVOFLOXACIN 500 MG PO TABS: 500.0000 mg | ORAL_TABLET | Freq: Every day | ORAL | Status: DC

## 2015-05-17 NOTE — Care Management (Signed)
Spoke with patient who is alert and oriented. Patient stated that he does drive himself however he doesn't have a license at this time and was suppose to go to court and get his license back but was admitted to hospital and missed court date.  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 He lives with his spouse who drives him. Has Walker and Cane at home. Stated that he would prefer outpatient PT but would be open to home health. Choice given ans stated [facility] as choice.  Insurance may not pay for home health PT due to not being home bound. Contacted Tiffany at [facility] to see if insurance would cover. MD would like home PT and recommendation from physical therapy was SNF. Patient does not want SNF. Anticipate discharge today. Will inform MD if insurance will not pay for home health. Plan then would be outpatient PT.

## 2015-05-17 NOTE — Progress Notes (Signed)
Physical Therapy Treatment Patient Details Name: Troy [name] MRN: [id number] DOB: [birth year] Today's Date: 05/17/2015    History of Present Illness 44 yo male with onset of sepsis and EtOH withdrawal symptoms has been referred to PT for ck of functional level.  Pt has cirrhosis and was attempted to have paracentesis, but no success.      PT Comments    Pt agreeable to PT. Requires encouragement to perform basic mobility skills and takes increased time for motor planning in/out of bed and with sit to stand. Pt notes fatigue/unsafe feeling last 10-15 feet of ambulation without LOB. Discussed recommendation of rehab with case management due to poor functional mobility and strength  Follow Up Recommendations  SNF     Equipment Recommendations  None recommended by PT (Has rw and quad cane at home)    Recommendations for Other Services       Precautions / Restrictions Precautions Precautions: Fall Restrictions Weight Bearing Restrictions: No    Mobility  Bed Mobility Overal bed mobility: Modified Independent Bed Mobility: Supine to Sit;Sit to Supine     Supine to sit: Modified independent (Device/Increase time) Sit to supine: Modified independent (Device/Increase time)   General bed mobility comments: Use trapeze for repostioning upward in bed (Increased time for motor planning; somewhat increased effort)  Transfers Overall transfer level: Modified independent Equipment used: Rolling walker (2 wheeled);1 person hand held assist Transfers: Sit to/from Stand Sit to Stand: Min guard (Cues for hands; increased time to prepare to stand)         General transfer comment: uses wide BOS to stand  Ambulation/Gait Ambulation/Gait assistance: Min guard Ambulation Distance (Feet): 60 Feet Assistive device: Rolling walker (2 wheeled) Gait Pattern/deviations: Step-through pattern;Trunk flexed;Wide base of support Gait velocity: Reduced Gait velocity interpretation: <1.8 ft/sec,  indicative of risk for recurrent falls General Gait Details: Pt becomes fatigued and notes he feels unsafe to continue walking post 60 ft   Stairs            Wheelchair Mobility    Modified Rankin (Stroke Patients Only)       Balance         Postural control:  (Forward flexed) Standing balance support: Bilateral upper extremity supported Standing balance-Leahy Scale: Poor                      Cognition Arousal/Alertness: Awake/alert Behavior During Therapy: WFL for tasks assessed/performed Overall Cognitive Status: Within Functional Limits for tasks assessed                      Exercises General Exercises - Lower Extremity Quad Sets: Strengthening;Both;10 reps;Supine Gluteal Sets: Strengthening;Both;10 reps;Supine Long Arc Quad: Strengthening;Both;10 reps;Seated Hip Flexion/Marching: AROM;Both;10 reps;Seated Toe Raises: AROM;Both;10 reps;Seated Heel Raises: AROM;Both;10 reps;Seated    General Comments        Pertinent Vitals/Pain Pain Assessment: No/denies pain    Home Living                      Prior Function            PT Goals (current goals can now be found in the care plan section)      Frequency  Min 2X/week    PT Plan Current plan remains appropriate    Co-evaluation             End of Session Equipment Utilized During Treatment: Gait belt Activity Tolerance: Patient limited by fatigue Patient left:  in bed;with call bell/phone within reach;with bed alarm set     Time: [id number] PT Time Calculation (min) (ACUTE ONLY): 31 min  Charges:  $Gait Training: 8-22 mins $Therapeutic Exercise: 8-22 mins                    G Codes:      Charlaine Dalton 05/17/2015, 10:46 AM

## 2015-05-17 NOTE — Care Management (Signed)
Dr Vianne Bulls will send Rx for physical therapy to [facility] in [facility] fax [phone].Patient would like to use this facility due to being close to his home.  Spoke with Sonia Baller at [facility] [phone] who will contact patient to schedule appointments.  Spoke with patient who expressed understanding of plan.

## 2015-05-17 NOTE — Clinical Social Work Note (Signed)
Patient just finished with assessment with PT. PT informed CSW that they were going to still recommend rehab based upon his doing 87ft and stating he didn't want to do anymore. Patient's nurse had informed CSW that he was able to get up off the bed on his own and transfer to his chair on his own earlier today. Patient informed me that prior to PT coming in to work with him he had been standing for a while so that the aid could change his bed and he had also had a bath prior to PT working with him. Patient stated he could have done more than 39 ft but he did not want to tire himself out and risk falling on the PT lady because he stated she was small and wouldn't be able to assist well. Patient also understands that with his ambulating 60 ft his insurance may not approve for him to go to a nursing home for rehab. Patient vocalized that he wishes to return home at discharge and does not believe he needs to go to a nursing home for rehab.  Shela Leff MSW,LCSWA [phone]

## 2015-05-17 NOTE — Progress Notes (Signed)
Crit lab called in K+ 2.8.  MD Marcille Blanco notified

## 2015-05-17 NOTE — Progress Notes (Signed)
ANTIBIOTIC CONSULT NOTE - FOLLOW UP s Pharmacy Consult for Vancomycin and Ceftriaxone Indication: rule out sepsis  No Known Allergies  Patient Measurements: Height: 5\' 8"  (172.7 cm) Weight: 230 lb 9.6 oz (104.6 kg) IBW/kg (Calculated) : 68.4  Vital Signs: Temp: 97.9 F (36.6 C) (06/02 0812) Temp Source: Oral (06/02 0812) BP: 98/56 mmHg (06/02 0812) Pulse Rate: 78 (06/02 0812)  Labs:  Recent Labs  05/15/15 1253 05/15/15 1907 05/16/15 0507 05/17/15 0441  WBC 13.3*  --   --   --   HGB 8.1*  --   --   --   PLT 101*  --   --   --   CREATININE  --  0.80 0.60* 0.74   Estimated Creatinine Clearance: 138.2 mL/min (by C-G formula based on Cr of 0.74).  Recent Labs  05/16/15 1014 05/16/15 2209  VANCOTROUGH 79*  --   VANCORANDOM  --  18     Microbiology: Recent Results (from the past 720 hour(s))  MRSA PCR Screening     Status: None   Collection Time: 05/13/15  2:26 PM  Result Value Ref Range Status   MRSA by PCR NEGATIVE NEGATIVE Final    Comment:        The GeneXpert MRSA Assay (FDA approved for NASAL specimens only), is one component of a comprehensive MRSA colonization surveillance program. It is not intended to diagnose MRSA infection nor to guide or monitor treatment for MRSA infections.   Blood culture (routine x 2)     Status: None (Preliminary result)   Collection Time: 05/13/15  4:30 PM  Result Value Ref Range Status   Specimen Description BLOOD  Final   Special Requests BLOOD CULTURE RECEIVED  Final   Culture NO GROWTH 3 DAYS  Final   Report Status PENDING  Incomplete  Blood culture (routine x 2)     Status: None (Preliminary result)   Collection Time: 05/13/15  4:35 PM  Result Value Ref Range Status   Specimen Description BLOOD  Final   Special Requests BLOOD CULTURE RECEIVED  Final   Culture NO GROWTH 3 DAYS  Final   Report Status PENDING  Incomplete  C difficile quick scan w PCR reflex [facility])     Status: None   Collection Time: 05/14/15   5:30 PM  Result Value Ref Range Status   C Diff antigen POSITIVE  Final   C Diff toxin NEGATIVE  Final   C Diff interpretation   Final    Positive for toxigenic C. difficile, active toxin production not detected. Patient has toxigenic C. difficile organisms present in the bowel, but toxin was not detected. The patient may be a carrier or the level of toxin in the sample was below the limit  of detection. This information should be used in conjunction with the patient's clinical history when deciding on possible therapy.     Comment: CRITICAL RESULT CALLED TO, READ BACK BY AND VERIFIED WITH: AMBER THOMAS AT 2136 05/14/15 CTJ   Clostridium Difficile by PCR (not at [facility])     Status: Abnormal   Collection Time: 05/14/15  5:30 PM  Result Value Ref Range Status   C difficile by pcr POSITIVE (A) NEGATIVE Corrected    Comment: CORRECTED ON 05/31 AT 1519: PREVIOUSLY REPORTED AS Toxigenic C.diff POSITIVE    Anti-infectives    Start     Dose/Rate Route Frequency Ordered Stop   05/18/15 0300  vancomycin (VANCOCIN) 1,500 mg in sodium chloride 0.9 % 500 mL IVPB  1,500 mg 250 mL/hr over 120 Minutes Intravenous Every 18 hours 05/17/15 1033     05/17/15 0745  vancomycin (VANCOCIN) 1,500 mg in sodium chloride 0.9 % 500 mL IVPB     1,500 mg 250 mL/hr over 120 Minutes Intravenous STAT 05/17/15 0734 05/18/15 0745   05/16/15 1000  rifaximin (XIFAXAN) tablet 200 mg     200 mg Oral 3 times daily 05/16/15 0905     05/15/15 1800  metroNIDAZOLE (FLAGYL) tablet 500 mg     500 mg Oral 4 times per day 05/15/15 1505     05/14/15 1700  cefTRIAXone (ROCEPHIN) 2 g in dextrose 5 % 50 mL IVPB - Premix     2 g 100 mL/hr over 30 Minutes Intravenous Every 24 hours 05/14/15 0023     05/14/15 0600  vancomycin (VANCOCIN) 1,500 mg in sodium chloride 0.9 % 500 mL IVPB  Status:  Discontinued     1,500 mg 250 mL/hr over 120 Minutes Intravenous Every 12 hours 05/14/15 0023 05/16/15 1203   05/13/15 1700  cefTRIAXone  (ROCEPHIN) 2 g in dextrose 5 % 50 mL IVPB - Premix     2 g 100 mL/hr over 30 Minutes Intravenous STAT 05/13/15 1653 05/13/15 1751   05/13/15 1700  vancomycin (VANCOCIN) 2,000 mg in sodium chloride 0.9 % 500 mL IVPB     2,000 mg 250 mL/hr over 120 Minutes Intravenous  Once 05/13/15 1653 05/13/15 2014      Assessment: Patient on vancomycin and ceftriaxone for r/o sepsis/SBP prophylaxis to change to Cipro tomorrow per MD note.   Vancomycin level ~ 18h after last dose: 48mcg/ml - therapeutic   Patient also C. Diff positive on metronidazole po.  Blood cx: NGTD x 2.   Goal of Therapy:  Vancomycin trough level 15-20 mcg/ml  Plan:  1. Continue ceftriaxone 2 g iv q 24 h.   2. Vancomycin level ~ 18h after last dose: 78mcg/ml. Ordered vancomycin 1500mg  IV Q18H. Will continue to follow renal function and recheck trough if indicated  Rexene Edison, PharmD Clinical Pharmacist 05/17/2015 10:35 AM

## 2015-05-17 NOTE — Progress Notes (Signed)
Pt discharged to home. Brief changed prior to discharge. Educated on proper Comptroller. IV sites removed. Concerns addressed. Discharge information given.

## 2015-05-17 NOTE — Outcomes Assessment (Signed)
patient is A+O with no signs of distress. PRN seroquel administered per request.  Denies pain at this time. CIWA assessed, on cdiff precautions.

## 2015-05-17 NOTE — Discharge Summary (Signed)
Troy [name], is a 44 y.o. male  DOB 01/17/1971  MRN [id number].  Admission date:  05/13/2015  Admitting Physician  Aldean Jewett, MD  Discharge Date:  05/17/2015   Primary MD  No primary care provider on file.  Recommendations for primary care physician for things to follow:   F/u PMD in one week   Admission Diagnosis  Sepsis, due to unspecified organism [A41.9]   Discharge Diagnosis  Sepsis, due to unspecified organism [A41.9]    Active Problems:   Alcohol withdrawal seizure   Lactic acidosis   Leukocytosis      Past Medical History  Diagnosis Date  . Liver damage   . Hypertension   . Neuropathy   . Seizures     complicated ETOH withdrawal  . Pancytopenia, acquired   . Ascites due to alcoholic cirrhosis   . DTs (delirium tremens)   . Hypothyroidism     Past Surgical History  Procedure Laterality Date  . Joint replacement      Left hip replacement       History of present illness and  Hospital Course:     Kindly see H&P for history of present illness and admission details, please review complete Labs, Consult reports and Test reports for all details in brief  HPI  from the history and physical done on the day of admission    44 year old male with alcoholic liver cirrhosis , admitted for generalized weakness found to have septic shock with the elevated white count 28,000 lactic acid 8.9 and heart rate 120-1 30 bpm. Started on IV vancomycin, Rocephin. And the patient admitted for to hospital ist service under ICU stepdown ,   2. Hospital course;  #1 sepsis with elevated white count 28,000 and an elevated lactic acid patient to medical treated for SBP paracentesis attempted but the there was not enough fluid to drain for diagnostic paracentesis so patient did not have paracentesis. White count  decreased from 28,000-13,000. Patient feels much better. Tolerating the diet. Plan is to discharge home with Levaquin, Flagyl.  #2 on color withdrawal seizure admitted for WITHDRAWAL SEIZURES PLACED ON 0 PROTOCOL, THIAMINE, FOLIC ACID, MULTIVITAMINS AND IV FLUIDS. DID NOT HAVE ANY FURTHER SEIZURES. PATIENT ALSO WAS PLACED ON ATIVAN. Patient is seizure-free at this time and we counseled him against   Alcohol use.   #3 hypokalemia patient had diarrhea secondary to lactulose, Cdif  PCR is negative , patient potassium is replaced by IV and by mouth. Potassium today is 2.8. Patient says IV potassium is burning,  so I wrote script potassium supplements.  4.Re: ALcohol withdrawal seizures  He  seen by Dr. Franchot Mimes from psych and he refused any alcohol rehabilitation help.  Deconditioning physical therapy has seen the patient .patient walked 60 feet. T we are going to  arrange home physical therapy. Patient refused placement. Discharge Condition:  stable   Follow UP      Discharge Instructions  and  Discharge Medications        Medication List    TAKE these medications        allopurinol 300 MG tablet  Commonly known as:  ZYLOPRIM  Take 300 mg by mouth daily.     amLODipine 5 MG tablet  Commonly known as:  NORVASC  Take 5 mg by mouth daily.     ferrous sulfate 325 (65 FE) MG tablet  Take 325 mg by mouth daily.     folic acid 1 MG tablet  Commonly known as:  FOLVITE  Take 1 tablet (1 mg total) by mouth daily.     furosemide 40 MG tablet  Commonly known as:  LASIX  Take 1 tablet (40 mg total) by mouth daily.     gabapentin 100 MG capsule  Commonly known as:  NEURONTIN  Take 200 mg by mouth 2 (two) times daily as needed (for nerve pain).     lactulose 10 GM/15ML solution  Commonly known as:  CHRONULAC  Take 10-20 g by mouth 3 (three) times daily as needed for mild constipation.     levofloxacin 500 MG tablet  Commonly known as:  LEVAQUIN  Take 1 tablet (500 mg total) by mouth  daily.     levothyroxine 150 MCG tablet  Commonly known as:  SYNTHROID, LEVOTHROID  Take 150 mcg by mouth daily.     metroNIDAZOLE 500 MG tablet  Commonly known as:  FLAGYL  Take 1 tablet (500 mg total) by mouth 2 (two) times daily.     nadolol 40 MG tablet  Commonly known as:  CORGARD  Take 1 tablet (40 mg total) by mouth daily.     omega-3 acid ethyl esters 1 G capsule  Commonly known as:  LOVAZA  Take 2 g by mouth 3 (three) times daily.     pantoprazole 40 MG tablet  Commonly known as:  PROTONIX  Take 40 mg by mouth daily.     potassium chloride SA 20 MEQ tablet  Commonly known as:  K-DUR,KLOR-CON  Take 2 tablets (40 mEq total) by mouth 2 (two) times daily.     pregabalin 150 MG capsule  Commonly known as:  LYRICA  Take 150 mg by mouth 2 (two) times daily as needed (for nerve pain).     QUEtiapine 50 MG tablet  Commonly known as:  SEROQUEL  Take 50 mg by mouth at bedtime as needed (for sleep).     rifaximin 550 MG Tabs tablet  Commonly known as:  XIFAXAN  Take 550 mg by mouth 2 (two) times daily.     spironolactone 100 MG tablet  Commonly known as:  ALDACTONE  Take 150 mg by mouth daily.     thiamine 100 MG tablet  Take 1 tablet (100 mg total) by mouth daily.          Diet and Activity recommendation: See Discharge Instructions above   Consults obtained - none   Major procedures and Radiology Reports - PLEASE review detailed and final reports for all details, in brief -     US Abdomen  Limited  05/15/2015   CLINICAL DATA:  Evaluate for ascites  EXAM: LIMITED ABDOMINAL ULTRASOUND  COMPARISON:  None.  FINDINGS: Only a very trace amount of ascites is present in the right and left lower quadrants. The bladder is distended.  IMPRESSION: Trace ascites is present.  Paracentesis was not performed.  Bladder is distended.   Electronically Signed   By: Marybelle Killings M.D.   On: 05/15/2015 11:43   Dg Chest Portable 1 View  05/13/2015   CLINICAL DATA:  Generalized  weakness today.  EXAM: PORTABLE CHEST - 1 VIEW  COMPARISON:  Single view of the chest 02/11/2014 and 02/06/2014.  FINDINGS: The lungs are clear. Heart size is normal. There is no pneumothorax or pleural fluid. No focal bony abnormality is identified.  IMPRESSION: Negative chest.   Electronically Signed   By: Inge Rise M.D.   On: 05/13/2015 17:43    Micro Results     Recent Results (from the past 240 hour(s))  MRSA PCR Screening     Status: None   Collection Time: 05/13/15  2:26 PM  Result Value Ref Range Status   MRSA by PCR NEGATIVE NEGATIVE Final    Comment:        The GeneXpert MRSA Assay (FDA approved for NASAL specimens only), is one component of a comprehensive MRSA colonization surveillance program. It is not intended to diagnose MRSA infection nor to guide or monitor treatment for MRSA infections.   Blood culture (routine x 2)     Status: None (Preliminary result)   Collection Time: 05/13/15  4:30 PM  Result Value Ref Range Status   Specimen Description BLOOD  Final   Special Requests BLOOD CULTURE RECEIVED  Final   Culture NO GROWTH 3 DAYS  Final   Report Status PENDING  Incomplete  Blood culture (routine x 2)     Status: None (Preliminary result)   Collection Time: 05/13/15  4:35 PM  Result Value Ref Range Status   Specimen Description BLOOD  Final   Special Requests BLOOD CULTURE RECEIVED  Final   Culture NO GROWTH 3 DAYS  Final   Report Status PENDING  Incomplete  C difficile quick scan w PCR reflex [facility])     Status: None   Collection Time: 05/14/15  5:30 PM  Result Value Ref Range Status   C Diff antigen POSITIVE  Final   C Diff toxin NEGATIVE  Final   C Diff interpretation   Final    Positive for toxigenic C. difficile, active toxin production not detected. Patient has toxigenic C. difficile organisms present in the bowel, but toxin was not detected. The patient may be a carrier or the level of toxin in the sample was below the limit  of detection.  This information should be used in conjunction with the patient's clinical history when deciding on possible therapy.     Comment: CRITICAL RESULT CALLED TO, READ BACK BY AND VERIFIED WITH: AMBER THOMAS AT 2136 05/14/15 CTJ   Clostridium Difficile by PCR (not at [facility])     Status: Abnormal   Collection Time: 05/14/15  5:30 PM  Result Value Ref Range Status   C difficile by pcr POSITIVE (A) NEGATIVE Corrected    Comment: CORRECTED ON 05/31 AT 1519: PREVIOUSLY REPORTED AS Toxigenic C.diff POSITIVE       Today   Subjective:   Troy [name] today has no abdominal pain,wants to go home,  Objective:   Blood pressure 98/56, pulse 78, temperature 97.9 F (36.6 C),  temperature source Oral, resp. rate 19, height 5\' 8"  (1.727 m), weight 104.6 kg (230 lb 9.6 oz), SpO2 100 %.   Intake/Output Summary (Last 24 hours) at 05/17/15 1047 Last data filed at 05/17/15 0504  Gross per 24 hour  Intake    785 ml  Output      0 ml  Net    785 ml    Exam Awake Alert, Oriented x 3, No new F.N deficits, Normal affect .AT,PERRAL Supple Neck,No JVD, No cervical lymphadenopathy appriciated.  Symmetrical Chest wall movement, Good air movement bilaterally, CTAB RRR,No Gallops,Rubs or new Murmurs, No Parasternal Heave +ve B.Sounds, Abd Soft, Non tender, No organomegaly appriciated, No rebound -guarding or rigidity. No Cyanosis, Clubbing or edema, No new Rash or bruise  Data Review   CBC w Diff: Lab Results  Component Value Date   WBC 13.3* 05/15/2015   WBC 4.8 01/12/2015   HGB 8.1* 05/15/2015   HGB 12.5* 01/12/2015   HCT 25.4* 05/15/2015   HCT 37.0* 01/12/2015   PLT 101* 05/15/2015   PLT 94* 01/12/2015   LYMPHOPCT 3 05/13/2015   LYMPHOPCT 22.2 10/27/2014   MONOPCT 2 05/13/2015   MONOPCT 8.5 10/27/2014   EOSPCT 0 05/13/2015   EOSPCT 1.3 10/27/2014   BASOPCT 0 05/13/2015   BASOPCT 0.6 10/27/2014    CMP: Lab Results  Component Value Date   NA 139 05/17/2015   NA 137 01/12/2015   K 2.8*  05/17/2015   K 3.3* 01/12/2015   CL 110 05/17/2015   CL 102 01/12/2015   CO2 24 05/17/2015   CO2 23 01/12/2015   BUN 6 05/17/2015   BUN 5* 01/12/2015   CREATININE 0.74 05/17/2015   CREATININE 0.67 01/12/2015   PROT 5.2* 05/14/2015   PROT 7.0 01/12/2015   ALBUMIN 2.1* 05/13/2015   ALBUMIN 3.6 01/12/2015   BILITOT 4.0* 05/13/2015   ALKPHOS 303* 05/13/2015   ALKPHOS 178* 01/12/2015   AST 58* 05/13/2015   AST 266* 01/12/2015   ALT 17 05/13/2015   ALT 106* 01/12/2015  .   Total Time in preparing paper work, data evaluation and todays exam - 42 minutes  Fred Franzen M.D on 05/17/2015 at 10:47 AM

## 2015-05-18 LAB — CULTURE, BLOOD (ROUTINE X 2)
Culture: NO GROWTH
Culture: NO GROWTH

## 2015-06-01 LAB — MRSA CULTURE

## 2015-06-06 ENCOUNTER — Emergency Department: Payer: 59

## 2015-06-06 ENCOUNTER — Encounter: Payer: Self-pay | Admitting: *Deleted

## 2015-06-06 ENCOUNTER — Inpatient Hospital Stay
Admission: EM | Admit: 2015-06-06 | Discharge: 2015-06-21 | DRG: 871 | Disposition: A | Discharge status: Home / Self Care | Payer: 59 | Attending: Internal Medicine | Admitting: Internal Medicine

## 2015-06-06 DIAGNOSIS — I10 Essential (primary) hypertension: Secondary | ICD-10-CM | POA: Diagnosis present

## 2015-06-06 DIAGNOSIS — E876 Hypokalemia: Secondary | ICD-10-CM

## 2015-06-06 DIAGNOSIS — R159 Full incontinence of feces: Secondary | ICD-10-CM | POA: Diagnosis not present

## 2015-06-06 DIAGNOSIS — E875 Hyperkalemia: Secondary | ICD-10-CM | POA: Diagnosis present

## 2015-06-06 DIAGNOSIS — D6959 Other secondary thrombocytopenia: Secondary | ICD-10-CM | POA: Diagnosis present

## 2015-06-06 DIAGNOSIS — D696 Thrombocytopenia, unspecified: Secondary | ICD-10-CM | POA: Diagnosis present

## 2015-06-06 DIAGNOSIS — K7031 Alcoholic cirrhosis of liver with ascites: Secondary | ICD-10-CM | POA: Diagnosis present

## 2015-06-06 DIAGNOSIS — A0472 Enterocolitis due to Clostridium difficile, not specified as recurrent: Secondary | ICD-10-CM | POA: Diagnosis present

## 2015-06-06 DIAGNOSIS — F10231 Alcohol dependence with withdrawal delirium: Secondary | ICD-10-CM | POA: Diagnosis present

## 2015-06-06 DIAGNOSIS — K7290 Hepatic failure, unspecified without coma: Secondary | ICD-10-CM | POA: Diagnosis present

## 2015-06-06 DIAGNOSIS — R6521 Severe sepsis with septic shock: Secondary | ICD-10-CM | POA: Diagnosis present

## 2015-06-06 DIAGNOSIS — R571 Hypovolemic shock: Secondary | ICD-10-CM | POA: Diagnosis present

## 2015-06-06 DIAGNOSIS — A047 Enterocolitis due to Clostridium difficile: Secondary | ICD-10-CM | POA: Diagnosis present

## 2015-06-06 DIAGNOSIS — R197 Diarrhea, unspecified: Secondary | ICD-10-CM | POA: Diagnosis present

## 2015-06-06 DIAGNOSIS — G9341 Metabolic encephalopathy: Secondary | ICD-10-CM | POA: Diagnosis present

## 2015-06-06 DIAGNOSIS — K644 Residual hemorrhoidal skin tags: Secondary | ICD-10-CM | POA: Diagnosis present

## 2015-06-06 DIAGNOSIS — E872 Acidosis: Secondary | ICD-10-CM | POA: Diagnosis present

## 2015-06-06 DIAGNOSIS — G629 Polyneuropathy, unspecified: Secondary | ICD-10-CM | POA: Diagnosis present

## 2015-06-06 DIAGNOSIS — E43 Unspecified severe protein-calorie malnutrition: Secondary | ICD-10-CM | POA: Diagnosis present

## 2015-06-06 DIAGNOSIS — E871 Hypo-osmolality and hyponatremia: Secondary | ICD-10-CM | POA: Diagnosis present

## 2015-06-06 DIAGNOSIS — A419 Sepsis, unspecified organism: Secondary | ICD-10-CM

## 2015-06-06 DIAGNOSIS — F1010 Alcohol abuse, uncomplicated: Secondary | ICD-10-CM | POA: Diagnosis present

## 2015-06-06 DIAGNOSIS — B9689 Other specified bacterial agents as the cause of diseases classified elsewhere: Secondary | ICD-10-CM | POA: Diagnosis present

## 2015-06-06 DIAGNOSIS — N170 Acute kidney failure with tubular necrosis: Secondary | ICD-10-CM | POA: Diagnosis present

## 2015-06-06 DIAGNOSIS — Z8619 Personal history of other infectious and parasitic diseases: Secondary | ICD-10-CM | POA: Diagnosis not present

## 2015-06-06 DIAGNOSIS — E039 Hypothyroidism, unspecified: Secondary | ICD-10-CM | POA: Diagnosis present

## 2015-06-06 DIAGNOSIS — Z96642 Presence of left artificial hip joint: Secondary | ICD-10-CM | POA: Diagnosis present

## 2015-06-06 DIAGNOSIS — N179 Acute kidney failure, unspecified: Secondary | ICD-10-CM | POA: Diagnosis present

## 2015-06-06 DIAGNOSIS — K7460 Unspecified cirrhosis of liver: Secondary | ICD-10-CM

## 2015-06-06 DIAGNOSIS — D638 Anemia in other chronic diseases classified elsewhere: Secondary | ICD-10-CM | POA: Diagnosis present

## 2015-06-06 DIAGNOSIS — I959 Hypotension, unspecified: Secondary | ICD-10-CM

## 2015-06-06 DIAGNOSIS — R109 Unspecified abdominal pain: Secondary | ICD-10-CM

## 2015-06-06 DIAGNOSIS — E860 Dehydration: Secondary | ICD-10-CM | POA: Diagnosis present

## 2015-06-06 HISTORY — DX: Unspecified viral hepatitis C without hepatic coma: B19.20

## 2015-06-06 LAB — CBC
HCT: 23.8 % — ABNORMAL LOW (ref 40.0–52.0)
HEMATOCRIT: 28.4 % — AB (ref 40.0–52.0)
HEMOGLOBIN: 9.6 g/dL — AB (ref 13.0–18.0)
Hemoglobin: 7.9 g/dL — ABNORMAL LOW (ref 13.0–18.0)
MCH: 33.5 pg (ref 26.0–34.0)
MCH: 33.3 pg (ref 26.0–34.0)
MCHC: 33.9 g/dL (ref 32.0–36.0)
MCHC: 33.3 g/dL (ref 32.0–36.0)
MCV: 99 fL (ref 80.0–100.0)
MCV: 100 fL (ref 80.0–100.0)
PLATELETS: 146 10*3/uL — AB (ref 150–440)
Platelets: 229 10*3/uL (ref 150–440)
RBC: 2.87 MIL/uL — AB (ref 4.40–5.90)
RBC: 2.38 MIL/uL — ABNORMAL LOW (ref 4.40–5.90)
RDW: 14.2 % (ref 11.5–14.5)
RDW: 13.9 % (ref 11.5–14.5)
WBC: 34.7 10*3/uL — AB (ref 3.8–10.6)
WBC: 23.5 10*3/uL — ABNORMAL HIGH (ref 3.8–10.6)

## 2015-06-06 LAB — COMPREHENSIVE METABOLIC PANEL
ALT: 13 U/L — ABNORMAL LOW (ref 17–63)
ANION GAP: 21 — AB (ref 5–15)
AST: 51 U/L — ABNORMAL HIGH (ref 15–41)
Albumin: 2.4 g/dL — ABNORMAL LOW (ref 3.5–5.0)
Alkaline Phosphatase: 234 U/L — ABNORMAL HIGH (ref 38–126)
BUN: 18 mg/dL (ref 6–20)
CO2: 19 mmol/L — AB (ref 22–32)
CREATININE: 1.51 mg/dL — AB (ref 0.61–1.24)
Calcium: 6.4 mg/dL — CL (ref 8.9–10.3)
Chloride: 85 mmol/L — ABNORMAL LOW (ref 101–111)
GFR calc non Af Amer: 55 mL/min — ABNORMAL LOW (ref >60)
Glucose, Bld: 158 mg/dL — ABNORMAL HIGH (ref 65–99)
Potassium: <2 mmol/L — CL (ref 3.5–5.1)
Sodium: 125 mmol/L — ABNORMAL LOW (ref 135–145)
Total Bilirubin: 2.2 mg/dL — ABNORMAL HIGH (ref 0.3–1.2)
Total Protein: 6.2 g/dL — ABNORMAL LOW (ref 6.5–8.1)

## 2015-06-06 LAB — BASIC METABOLIC PANEL
Anion gap: 20 — ABNORMAL HIGH (ref 5–15)
BUN: 17 mg/dL (ref 6–20)
CO2: 21 mmol/L — ABNORMAL LOW (ref 22–32)
CREATININE: 1.3 mg/dL — AB (ref 0.61–1.24)
Calcium: 5.9 mg/dL — CL (ref 8.9–10.3)
Chloride: 90 mmol/L — ABNORMAL LOW (ref 101–111)
GFR calc non Af Amer: >60 mL/min (ref >60)
Glucose, Bld: 147 mg/dL — ABNORMAL HIGH (ref 65–99)
Potassium: <2 mmol/L — CL (ref 3.5–5.1)
Sodium: 131 mmol/L — ABNORMAL LOW (ref 135–145)

## 2015-06-06 LAB — C DIFFICILE QUICK SCREEN W PCR REFLEX
C Diff antigen: POSITIVE
C Diff toxin: NEGATIVE

## 2015-06-06 LAB — URINALYSIS COMPLETE WITH MICROSCOPIC (ARMC ONLY)
Bacteria, UA: NONE SEEN
Bilirubin Urine: NEGATIVE
Glucose, UA: NEGATIVE mg/dL
Hgb urine dipstick: NEGATIVE
Ketones, ur: NEGATIVE mg/dL
Leukocytes, UA: NEGATIVE
NITRITE: NEGATIVE
PROTEIN: NEGATIVE mg/dL
RBC / HPF: NONE SEEN RBC/hpf (ref 0–5)
Specific Gravity, Urine: 1.009 (ref 1.005–1.030)
pH: 6 (ref 5.0–8.0)

## 2015-06-06 LAB — CLOSTRIDIUM DIFFICILE BY PCR: Toxigenic C. Difficile by PCR: POSITIVE — AB

## 2015-06-06 LAB — GLUCOSE, CAPILLARY
GLUCOSE-CAPILLARY: 153 mg/dL — AB (ref 65–99)
GLUCOSE-CAPILLARY: 122 mg/dL — AB (ref 65–99)

## 2015-06-06 LAB — MAGNESIUM: Magnesium: 1.1 mg/dL — ABNORMAL LOW (ref 1.7–2.4)

## 2015-06-06 LAB — LACTIC ACID, PLASMA: LACTIC ACID, VENOUS: 3.6 mmol/L — AB (ref 0.5–2.0)

## 2015-06-06 LAB — ABO/RH: ABO/RH(D): O POS

## 2015-06-06 LAB — ETHANOL: Alcohol, Ethyl (B): 398 mg/dL (ref <5)

## 2015-06-06 LAB — OCCULT BLOOD X 1 CARD TO LAB, STOOL: Fecal Occult Bld: POSITIVE — AB

## 2015-06-06 MED ORDER — RIFAXIMIN 550 MG PO TABS
550.0000 mg | ORAL_TABLET | Freq: Two times a day (BID) | ORAL | Status: DC
  Administered 2015-06-06 – 2015-06-12 (×12): 550 mg via ORAL
  Filled 2015-06-06 (×12): qty 1

## 2015-06-06 MED ORDER — PIPERACILLIN-TAZOBACTAM 3.375 G IVPB 30 MIN: 3.3750 g | Freq: Once | INTRAVENOUS | Status: DC

## 2015-06-06 MED ORDER — POTASSIUM CHLORIDE 10 MEQ/100ML IV SOLN
10.0000 meq | INTRAVENOUS | Status: DC
  Administered 2015-06-06 – 2015-06-07 (×4): 10 meq via INTRAVENOUS
  Filled 2015-06-06 (×4): qty 100

## 2015-06-06 MED ORDER — ACETAMINOPHEN 650 MG RE SUPP: 650.0000 mg | Freq: Four times a day (QID) | RECTAL | Status: DC | PRN

## 2015-06-06 MED ORDER — LORAZEPAM 2 MG PO TABS: 0.0000 mg | ORAL_TABLET | Freq: Two times a day (BID) | ORAL | Status: DC

## 2015-06-06 MED ORDER — POTASSIUM CHLORIDE IN NACL 20-0.9 MEQ/L-% IV SOLN
INTRAVENOUS | Status: AC
  Filled 2015-06-06: qty 1000

## 2015-06-06 MED ORDER — THIAMINE HCL 100 MG/ML IJ SOLN
INTRAMUSCULAR | Status: AC
  Administered 2015-06-06: 100 mg via INTRAVENOUS
  Filled 2015-06-06: qty 2

## 2015-06-06 MED ORDER — SODIUM CHLORIDE 0.9 % IJ SOLN
3.0000 mL | Freq: Two times a day (BID) | INTRAMUSCULAR | Status: DC
  Administered 2015-06-06 – 2015-06-21 (×29): 3 mL via INTRAVENOUS

## 2015-06-06 MED ORDER — LORAZEPAM 2 MG PO TABS: 0.0000 mg | ORAL_TABLET | Freq: Four times a day (QID) | ORAL | Status: DC

## 2015-06-06 MED ORDER — LEVOTHYROXINE SODIUM 150 MCG PO TABS
150.0000 ug | ORAL_TABLET | Freq: Every day | ORAL | Status: DC
  Administered 2015-06-07 – 2015-06-15 (×8): 150 ug via ORAL
  Filled 2015-06-06 (×9): qty 1

## 2015-06-06 MED ORDER — ACETAMINOPHEN 325 MG PO TABS: 650.0000 mg | ORAL_TABLET | Freq: Four times a day (QID) | ORAL | Status: DC | PRN

## 2015-06-06 MED ORDER — PANTOPRAZOLE SODIUM 40 MG PO TBEC
40.0000 mg | DELAYED_RELEASE_TABLET | Freq: Every day | ORAL | Status: DC
  Administered 2015-06-06 – 2015-06-07 (×2): 40 mg via ORAL
  Filled 2015-06-06 (×2): qty 1

## 2015-06-06 MED ORDER — POTASSIUM CHLORIDE 20 MEQ PO PACK
PACK | ORAL | Status: AC
  Administered 2015-06-06: 40 meq via ORAL
  Filled 2015-06-06: qty 2

## 2015-06-06 MED ORDER — POTASSIUM CHLORIDE 20 MEQ PO PACK
40.0000 meq | PACK | Freq: Two times a day (BID) | ORAL | Status: DC
  Administered 2015-06-06 – 2015-06-21 (×30): 40 meq via ORAL
  Filled 2015-06-06 (×29): qty 2

## 2015-06-06 MED ORDER — LORAZEPAM 2 MG/ML IJ SOLN: 0.0000 mg | Freq: Four times a day (QID) | INTRAMUSCULAR | Status: DC

## 2015-06-06 MED ORDER — GABAPENTIN 100 MG PO CAPS
200.0000 mg | ORAL_CAPSULE | Freq: Two times a day (BID) | ORAL | Status: DC | PRN
  Administered 2015-06-13 – 2015-06-15 (×2): 200 mg via ORAL
  Filled 2015-06-06 (×3): qty 2

## 2015-06-06 MED ORDER — FOLIC ACID 1 MG PO TABS
1.0000 mg | ORAL_TABLET | Freq: Every day | ORAL | Status: DC
  Administered 2015-06-06: 1 mg via ORAL
  Filled 2015-06-06: qty 1

## 2015-06-06 MED ORDER — THIAMINE HCL 100 MG/ML IJ SOLN
100.0000 mg | Freq: Every day | INTRAMUSCULAR | Status: DC
  Administered 2015-06-06: 100 mg via INTRAVENOUS

## 2015-06-06 MED ORDER — POTASSIUM CHLORIDE IN NACL 20-0.9 MEQ/L-% IV SOLN
INTRAVENOUS | Status: DC
  Administered 2015-06-06 – 2015-06-13 (×14): via INTRAVENOUS
  Filled 2015-06-06 (×21): qty 1000

## 2015-06-06 MED ORDER — PANTOPRAZOLE SODIUM 40 MG IV SOLR: 40.0000 mg | Freq: Two times a day (BID) | INTRAVENOUS | Status: DC

## 2015-06-06 MED ORDER — PIPERACILLIN-TAZOBACTAM 3.375 G IVPB 30 MIN
3.3750 g | Freq: Once | INTRAVENOUS | Status: AC
  Administered 2015-06-06: 3.375 g via INTRAVENOUS

## 2015-06-06 MED ORDER — VANCOMYCIN HCL IN DEXTROSE 1-5 GM/200ML-% IV SOLN
1000.0000 mg | Freq: Once | INTRAVENOUS | Status: AC
  Administered 2015-06-06: 1000 mg via INTRAVENOUS

## 2015-06-06 MED ORDER — PREGABALIN 75 MG PO CAPS
150.0000 mg | ORAL_CAPSULE | Freq: Two times a day (BID) | ORAL | Status: DC | PRN
  Administered 2015-06-06: 150 mg via ORAL
  Filled 2015-06-06: qty 2

## 2015-06-06 MED ORDER — ALLOPURINOL 100 MG PO TABS
300.0000 mg | ORAL_TABLET | Freq: Every day | ORAL | Status: DC
  Administered 2015-06-06 – 2015-06-21 (×16): 300 mg via ORAL
  Filled 2015-06-06 (×8): qty 3
  Filled 2015-06-06: qty 1
  Filled 2015-06-06 (×2): qty 3
  Filled 2015-06-06: qty 1
  Filled 2015-06-06 (×3): qty 3
  Filled 2015-06-06: qty 1
  Filled 2015-06-06: qty 3

## 2015-06-06 MED ORDER — VANCOMYCIN HCL 10 G IV SOLR
1250.0000 mg | Freq: Two times a day (BID) | INTRAVENOUS | Status: DC
  Filled 2015-06-06 (×3): qty 1250

## 2015-06-06 MED ORDER — SODIUM CHLORIDE 0.9 % IV BOLUS (SEPSIS)
1000.0000 mL | INTRAVENOUS | Status: AC
  Administered 2015-06-06 (×3): 1000 mL via INTRAVENOUS

## 2015-06-06 MED ORDER — PIPERACILLIN-TAZOBACTAM 3.375 G IVPB
3.3750 g | Freq: Three times a day (TID) | INTRAVENOUS | Status: DC
  Administered 2015-06-07 (×2): 3.375 g via INTRAVENOUS
  Filled 2015-06-06 (×6): qty 50

## 2015-06-06 MED ORDER — VITAMIN B-1 100 MG PO TABS: 100.0000 mg | ORAL_TABLET | Freq: Every day | ORAL | Status: DC

## 2015-06-06 MED ORDER — LORAZEPAM 2 MG/ML IJ SOLN: 0.0000 mg | Freq: Two times a day (BID) | INTRAMUSCULAR | Status: DC

## 2015-06-06 MED ORDER — THIAMINE HCL 100 MG/ML IJ SOLN
Freq: Once | INTRAVENOUS | Status: AC
  Administered 2015-06-06: 23:00:00 via INTRAVENOUS
  Filled 2015-06-06: qty 1000

## 2015-06-06 MED ORDER — VANCOMYCIN HCL IN DEXTROSE 1-5 GM/200ML-% IV SOLN
INTRAVENOUS | Status: AC
  Administered 2015-06-06: 1000 mg via INTRAVENOUS
  Filled 2015-06-06: qty 200

## 2015-06-06 MED ORDER — PIPERACILLIN-TAZOBACTAM 3.375 G IVPB
INTRAVENOUS | Status: AC
  Administered 2015-06-06: 3.375 g via INTRAVENOUS
  Filled 2015-06-06: qty 50

## 2015-06-06 MED ORDER — QUETIAPINE FUMARATE 25 MG PO TABS
50.0000 mg | ORAL_TABLET | Freq: Every evening | ORAL | Status: DC | PRN
  Administered 2015-06-06 – 2015-06-21 (×12): 50 mg via ORAL
  Filled 2015-06-06 (×13): qty 2

## 2015-06-06 MED ORDER — VANCOMYCIN HCL 10 G IV SOLR
1250.0000 mg | Freq: Once | INTRAVENOUS | Status: AC
  Administered 2015-06-06: 1250 mg via INTRAVENOUS
  Filled 2015-06-06: qty 1250

## 2015-06-06 MED ORDER — HEPARIN SODIUM (PORCINE) 5000 UNIT/ML IJ SOLN
5000.0000 [IU] | Freq: Three times a day (TID) | INTRAMUSCULAR | Status: DC
Start: 2015-06-06 — End: 2015-06-07
  Administered 2015-06-06 – 2015-06-07 (×2): 5000 [IU] via SUBCUTANEOUS
  Filled 2015-06-06 (×2): qty 1

## 2015-06-06 NOTE — ED Notes (Signed)
MD notified of critical ETOH level

## 2015-06-06 NOTE — ED Notes (Signed)
Pt incontinent small amount liquid brown stool; pt clensed; buttocks reddened; attends changed and pt repos in bed for comfort; tolerated po KLOR-con without difficulty; pt denies c/o at present; A&Ox3

## 2015-06-06 NOTE — ED Provider Notes (Addendum)
[facility] Emergency Department Provider Note  ____________________________________________  Time seen: Upon arrival to the emergency department  I have reviewed the triage vital signs and the nursing notes.   HISTORY  Chief Complaint Diarrhea    HPI Troy [name] is a 44 y.o. male with a history of liver disease as well as alcoholismrecently discharged from the hospital on [place] for sepsis believed to be from spontaneous bacterial peritonitis, presents to the hospital with uncontrolled diarrhea. The patient says that he was having diarrhea last time he was in the hospital but it has worsened. He called the fire department yesterday to help him get a shower because the diarrhea had become so far out of control. Per the medics today, the fire chief called the Department of Social Services but did not receive a call back from social services. It is unclear if a formal report has been filed at this time. Patient then called back to EMS today because he wasn't able to walk. This was after he had driven himself to the liquor store and brought vodka and drank about half the bottle. Per EMS, he had a trail of feces trailing him out of the liquor store. Also per EMS, they say that his house is uninhabitable because of the amount of stool covering the surfaces. They said that his family has left because of the amount of fecal matter. The patient is not complaining of any pain at this time. However, his history and physical is confounded by a decreased mental status. Possibly secondary to sepsis or alcohol intoxication.   Past Medical History  Diagnosis Date  . Liver damage   . Hypertension   . Neuropathy   . Seizures     complicated ETOH withdrawal  . Pancytopenia, acquired   . Ascites due to alcoholic cirrhosis   . DTs (delirium tremens)   . Hypothyroidism   . Hepatitis C     Patient Active Problem List   Diagnosis Date Noted  . Alcohol withdrawal seizure  05/13/2015  . Lactic acidosis 05/13/2015  . Leukocytosis 05/13/2015    Past Surgical History  Procedure Laterality Date  . Joint replacement      Left hip replacement    Current Outpatient Rx  Name  Route  Sig  Dispense  Refill  . allopurinol (ZYLOPRIM) 300 MG tablet   Oral   Take 300 mg by mouth daily.         Marland Kitchen amLODipine (NORVASC) 5 MG tablet   Oral   Take 5 mg by mouth daily.         . ferrous sulfate 325 (65 FE) MG tablet   Oral   Take 325 mg by mouth daily.         . folic acid (FOLVITE) 1 MG tablet   Oral   Take 1 tablet (1 mg total) by mouth daily.   30 tablet   0   . furosemide (LASIX) 40 MG tablet   Oral   Take 1 tablet (40 mg total) by mouth daily.   30 tablet   0   . gabapentin (NEURONTIN) 100 MG capsule   Oral   Take 200 mg by mouth 2 (two) times daily as needed (for nerve pain).         Marland Kitchen lactulose (CHRONULAC) 10 GM/15ML solution   Oral   Take 10-20 g by mouth 3 (three) times daily as needed for mild constipation.         Marland Kitchen levofloxacin (  LEVAQUIN) 500 MG tablet   Oral   Take 1 tablet (500 mg total) by mouth daily.   10 tablet   0   . levothyroxine (SYNTHROID, LEVOTHROID) 150 MCG tablet   Oral   Take 150 mcg by mouth daily.         . metroNIDAZOLE (FLAGYL) 500 MG tablet   Oral   Take 1 tablet (500 mg total) by mouth 2 (two) times daily.   20 tablet   0   . nadolol (CORGARD) 40 MG tablet   Oral   Take 1 tablet (40 mg total) by mouth daily.   30 tablet   0   . omega-3 acid ethyl esters (LOVAZA) 1 G capsule   Oral   Take 2 g by mouth 3 (three) times daily.         . pantoprazole (PROTONIX) 40 MG tablet   Oral   Take 40 mg by mouth daily.         . potassium chloride SA (K-DUR,KLOR-CON) 20 MEQ tablet   Oral   Take 2 tablets (40 mEq total) by mouth 2 (two) times daily.   20 tablet   0   . pregabalin (LYRICA) 150 MG capsule   Oral   Take 150 mg by mouth 2 (two) times daily as needed (for nerve pain).          . QUEtiapine (SEROQUEL) 50 MG tablet   Oral   Take 50 mg by mouth at bedtime as needed (for sleep).         . rifaximin (XIFAXAN) 550 MG TABS tablet   Oral   Take 550 mg by mouth 2 (two) times daily.         Marland Kitchen spironolactone (ALDACTONE) 100 MG tablet   Oral   Take 150 mg by mouth daily.         Marland Kitchen thiamine 100 MG tablet   Oral   Take 1 tablet (100 mg total) by mouth daily.   30 tablet   0     Allergies Review of patient's allergies indicates no known allergies.  Family History  Problem Relation Age of Onset  . Crohn's disease Father     Social History History  Substance Use Topics  . Smoking status: Unknown If Ever Smoked  . Smokeless tobacco: Not on file  . Alcohol Use: Yes     Comment: hx of chronic ETOH abuse    Review of Systems Constitutional: No fever/chills Eyes: No visual changes. ENT: No sore throat. Cardiovascular: Denies chest pain. Respiratory: Denies shortness of breath. Gastrointestinal: No abdominal pain.  No nausea, no vomiting.   No constipation. Genitourinary: Negative for dysuria. Musculoskeletal: Negative for back pain. Skin: Negative for rash. Neurological: Negative for headaches, focal weakness or numbness.  10-point ROS otherwise negative.  ____________________________________________   PHYSICAL EXAM:  VITAL SIGNS: ED Triage Vitals  Enc Vitals Group     BP 06/06/15 1555 85/51 mmHg     Pulse Rate 06/06/15 1600 115     Resp 06/06/15 1555 22     Temp 06/06/15 1555 98.6 F (37 C)     Temp Source 06/06/15 1555 Oral     SpO2 06/06/15 1555 99 %     Weight 06/06/15 1555 210 lb (95.255 kg)     Height 06/06/15 1555 6' (1.829 m)     Head Cir --      Peak Flow --      Pain Score --  Pain Loc --      Pain Edu? --      Excl. in [place]? --     Constitutional: Patient with clothing soiled. Needing to be cleaned by nursing staff and closed removed with trauma shears. Patient is arousable but answers questions and short sentences  or yes or no answers. Sometimes his answers are unintelligible.  Eyes: Conjunctivae are normal. PERRL. EOMI. Head: Atraumatic. Nose: No congestion/rhinnorhea. Neck: No stridor.   Cardiovascular: Tachycardic with a regular rhythm. Heart sounds grossly normal. Respiratory: Normal respiratory effort.  No retractions. Lungs CTAB. Gastrointestinal: Soft and nontender. No distention. No abdominal bruits. No CVA tenderness. Stool is yellow to greenish on clothing. No gross blood. Musculoskeletal: No lower extremity tenderness nor edema.  No joint effusions. Neurologic:  Slow to respond with some slurring of his speech however without facial droop or any focal neurologic deficit. He is moving all 4 extremities equally. Skin:  Erythematous excoriated skin to the midline buttocks without any pus or induration. Extends from coccyx through the anus. Psychiatric: Mood and affect are normal. Speech and behavior are normal.  ____________________________________________   LABS (all labs ordered are listed, but only abnormal results are displayed)  Labs Reviewed  CBC - Abnormal; Notable for the following:    WBC 34.7 (*)    RBC 2.87 (*)    Hemoglobin 9.6 (*)    HCT 28.4 (*)    All other components within normal limits  LACTIC ACID, PLASMA - Abnormal; Notable for the following:    Lactic Acid, Venous 3.6 (*)    All other components within normal limits  GLUCOSE, CAPILLARY - Abnormal; Notable for the following:    Glucose-Capillary 153 (*)    All other components within normal limits  COMPREHENSIVE METABOLIC PANEL - Abnormal; Notable for the following:    Sodium 125 (*)    Potassium <2.0 (*)    Chloride 85 (*)    CO2 19 (*)    Glucose, Bld 158 (*)    Creatinine, Ser 1.51 (*)    Calcium 6.4 (*)    Total Protein 6.2 (*)    Albumin 2.4 (*)    AST 51 (*)    ALT 13 (*)    Alkaline Phosphatase 234 (*)    Total Bilirubin 2.2 (*)    Anion gap 21 (*)    All other components within normal limits   CBC - Abnormal; Notable for the following:    WBC 23.5 (*)    RBC 2.38 (*)    Hemoglobin 7.9 (*)    HCT 23.8 (*)    Platelets 146 (*)    All other components within normal limits  C DIFFICILE QUICK SCAN W PCR REFLEX ([facility] ONLY)  CULTURE, BLOOD (ROUTINE X 2)  CULTURE, BLOOD (ROUTINE X 2)  URINE CULTURE  STOOL CULTURE  GIARDIA, EIA; OVA/PARASITE  URINALYSIS COMPLETEWITH MICROSCOPIC ([facility] ONLY)  LACTIC ACID, PLASMA  ETHANOL  GI PATHOGEN PANEL BY PCR, STOOL  BASIC METABOLIC PANEL  CBG MONITORING, ED  I-STAT CG4 LACTIC ACID, ED  I-STAT CG4 LACTIC ACID, ED  TYPE AND SCREEN  ABO/RH   ____________________________________________  EKG  ED ECG REPORT I, Temprance Wyre,  Youlanda Roys, the attending physician, personally viewed and interpreted this ECG.   Date: 06/06/2015  EKG Time: 1555  Rate: 112  Rhythm: sinus tachycardia  Axis: Normal axis.  Intervals:none  ST&T Change: No ST elevations or depressions. No abnormal T-wave inversions. Left ventricular hypertrophy with repolarization abnormality.  ____________________________________________  [id number]  Normal chest x-ray. ____________________________________________   PROCEDURES  CRITICAL CARE Performed by: Doran Stabler   Total critical care time: 40 minutes  Critical care time was exclusive of separately billable procedures and treating other patients.  Critical care was necessary to treat or prevent imminent or life-threatening deterioration.  Critical care was time spent personally by me on the following activities: development of treatment plan with patient and/or surrogate as well as nursing, discussions with consultants, evaluation of patient's response to treatment, examination of patient, obtaining history from patient or surrogate, ordering and performing treatments and interventions, ordering and review of laboratory studies, ordering and review of radiographic studies, pulse oximetry and re-evaluation of  patient's condition.  Patient initially hypotensive to 91P systolic. Resolved quickly to the 91T systolic with fluid hydration. This is likely secondary to dehydration. Sepsis protocol initiated. Feel that the presentation is consistent with C. difficile. Patient will need to be admitted. ____________________________________________   INITIAL IMPRESSION / ASSESSMENT AND PLAN / ED COURSE  Pertinent labs & imaging results that were available during my care of the patient were reviewed by me and considered in my medical decision making (see chart for details).  ----------------------------------------- 5:43 PM on 06/06/2015 -----------------------------------------  Patient with improved blood pressure to the 100s. Also heart rate resolved to the 90s. Second set of bloods drawn because lab feels that there may be "contamination."  I discussed this with one of the lab techs who said they thought that this flap may have been contaminated from fluid on the line. However, the initial labs were drawn from the initial IV stick. I believe these numbers are representative of the patient's medical condition and make sense with his history and physical exam. The patient likely has some degree of dehydration. Possible C. difficile. Holding Flagyl now because patient is clinically intoxicated. He is responding well to fluid hydration. Will be admitted to the hospital. Signed out to Dr. Clayton Bibles. ____________________________________________   FINAL CLINICAL IMPRESSION(S) / ED DIAGNOSES  Final diagnoses:  Sepsis, due to unspecified organism  Hypotension, unspecified hypotension type  Diarrhea  Hypokalemia      Orbie Pyo, MD 06/06/15 1744  I personally viewed the chest x-ray.  Orbie Pyo, MD 06/06/15 [id number]

## 2015-06-06 NOTE — ED Notes (Signed)
Pharmacy called for K-dur gtt, pt cleaned and changed

## 2015-06-06 NOTE — H&P (Signed)
[place] [facility] [place] NAME: Troy [name]    MR#:  [id number]  DATE OF BIRTH:  1971-02-23  DATE OF ADMISSION:  06/06/2015  PRIMARY CARE PHYSICIAN: No primary care provider on file.   REQUESTING/REFERRING PHYSICIAN:   CHIEF COMPLAINT:   Chief Complaint  Patient presents with  . Diarrhea    HISTORY OF PRESENT ILLNESS: Troy [name]  is a 44 y.o. male with a known history of recent admission for sepsis, alcohol related seizures,  metabolic , hepatic encephalopathy for which he was discharged on lactulose and Xifaxan presents to the hospital with complaints of ongoing diarrhea and feeling dehydrated and weak. Patient is somnolent , groggy and somewhat confused after medications in the emergency room. He was noted to be hypotensive with systolic blood pressure in 60s,  after IV fluids blood pressure improved. He was also tachycardic initially. His white blood cell count was also noted to be markedly elevated to about 34 thousand. His potassium level was found to be below 2. Hospitalist services were contacted for admission  PAST MEDICAL HISTORY:   Past Medical History  Diagnosis Date  . Liver damage   . Hypertension   . Neuropathy   . Seizures     complicated ETOH withdrawal  . Pancytopenia, acquired   . Ascites due to alcoholic cirrhosis   . DTs (delirium tremens)   . Hypothyroidism   . Hepatitis C     PAST SURGICAL HISTORY:  Past Surgical History  Procedure Laterality Date  . Joint replacement      Left hip replacement    SOCIAL HISTORY:  History  Substance Use Topics  . Smoking status: Unknown If Ever Smoked  . Smokeless tobacco: Not on file  . Alcohol Use: Yes     Comment: hx of chronic ETOH abuse    FAMILY HISTORY:  Family History  Problem Relation Age of Onset  . Crohn's disease Father     DRUG ALLERGIES: No Known Allergies  Review of Systems  Unable to perform ROS: medical condition    MEDICATIONS AT HOME:   Prior to Admission medications   Medication Sig Start Date End Date Taking? Authorizing Provider  folic acid (FOLVITE) 1 MG tablet Take 1 tablet (1 mg total) by mouth daily. 05/17/15  Yes Epifanio Lesches, MD  furosemide (LASIX) 40 MG tablet Take 1 tablet (40 mg total) by mouth daily. 05/17/15  Yes Epifanio Lesches, MD  nadolol (CORGARD) 40 MG tablet Take 1 tablet (40 mg total) by mouth daily. 05/17/15  Yes Epifanio Lesches, MD  potassium chloride SA (K-DUR,KLOR-CON) 20 MEQ tablet Take 2 tablets (40 mEq total) by mouth 2 (two) times daily. 05/17/15  Yes Epifanio Lesches, MD  pregabalin (LYRICA) 150 MG capsule Take 150 mg by mouth 2 (two) times daily as needed (for nerve pain).   Yes Historical Provider, MD  QUEtiapine (SEROQUEL) 50 MG tablet Take 50 mg by mouth at bedtime as needed (for sleep).   Yes Historical Provider, MD  thiamine 100 MG tablet Take 1 tablet (100 mg total) by mouth daily. 05/17/15  Yes Epifanio Lesches, MD      PHYSICAL EXAMINATION:   VITAL SIGNS: Blood pressure 107/56, pulse 100, temperature 98.6 F (37 C), temperature source Oral, resp. rate 18, height 6' (1.829 m), weight 95.255 kg (210 lb), SpO2 100 %.  GENERAL:  44 y.o.-year-old patient lying in the bed with no acute distress. Very somnolent, barely awaken, however, briefly, is able to open  his eyes and converse.  EYES: Pupils equal, round, reactive to light and accommodation. No scleral icterus. Extraocular muscles intact.  HEENT: Head atraumatic, normocephalic. Oropharynx and nasopharynx clear. Oral mucosa is dry NECK:  Supple, no jugular venous distention. No thyroid enlargement, no tenderness.  LUNGS: Normal breath sounds bilaterally, no wheezing, rales,rhonchi or crepitation. No use of accessory muscles of respiration.  CARDIOVASCULAR: S1, S2 normal. No murmurs, rubs, or gallops.  ABDOMEN: Soft, nontender, nondistended. Bowel sounds present. No organomegaly or mass.  EXTREMITIES: No pedal edema, cyanosis,  or clubbing.  NEUROLOGIC: Cranial nerves II through XII are intact. Muscle strength 5/5 in all extremities. Sensation intact. Gait not checked. Slurring speech PSYCHIATRIC: The patient is somnolent , slow to respond , but oriented x 2.  SKIN: No obvious rash, lesion, or ulcer. Trace pedal edema  LABORATORY PANEL:   CBC  Recent Labs Lab 06/06/15 1614 06/06/15 1710  WBC 34.7* 23.5*  HGB 9.6* 7.9*  HCT 28.4* 23.8*  PLT 229 146*  MCV 99.0 100.0  MCH 33.5 33.3  MCHC 33.9 33.3  RDW 14.2 13.9   ------------------------------------------------------------------------------------------------------------------  Chemistries   Recent Labs Lab 06/06/15 1614 06/06/15 1709  NA 125* 131*  K <2.0* <2.0*  CL 85* 90*  CO2 19* 21*  GLUCOSE 158* 147*  BUN 18 17  CREATININE 1.51* 1.30*  CALCIUM 6.4* 5.9*  AST 51*  --   ALT 13*  --   ALKPHOS 234*  --   BILITOT 2.2*  --    ------------------------------------------------------------------------------------------------------------------  Cardiac Enzymes No results for input(s): TROPONINI in the last 168 hours. ------------------------------------------------------------------------------------------------------------------  RADIOLOGY: Dg Chest Port 1 View  06/06/2015   CLINICAL DATA:  Altered mental status.  Diarrhea.  EXAM: PORTABLE CHEST - 1 VIEW  COMPARISON:  05/13/2015  FINDINGS: The heart size and mediastinal contours are within normal limits. Both lungs are clear. The visualized skeletal structures are unremarkable.  IMPRESSION: Normal exam.   Electronically Signed   By: Lorriane Shire M.D.   On: 06/06/2015 16:38    EKG: Orders placed or performed during the hospital encounter of 06/06/15  . ED EKG  . ED EKG   AG in the emergency room reveals sinus tachycardia with short PR interval at rate of 112 bpm, normal axis, left ventricular hypertrophy, with repolarization abnormality. No acute ST-T changes  IMPRESSION AND  PLAN:  Principal Problem:   Diarrhea Active Problems:   Sepsis   Acute renal failure   Hyperkalemia 1. Sepsis. Admit patient to medical floor, start him on vancomycin and Zosyn. Get blood,  urine as well as stool cultures 2. Diarrhea, possibly related to lactulose. However, cannot rule out infectious etiology, get stool cultures and continue antibiotics for now, get C. difficile studies and make decisions about Flagyl if needed. Stop Lactulose 3. Dehydration. Continue IV fluids. Follow patient clinically as well as lab wise 4. Acute renal failure due to dehydration, ATN seemed to be improving with IV fluid administration 5. Hypokalemia. Give magnesium level, supplement potassium IV 6. Leukocytosis. Follow with therapy 7. Lactic acidosis due to sepsis as well as dehydration, diarrhea. Follow with therapy 8. Elevated transaminases, likely due to alcohol abuse 9 . Alcohol abuse. Initiate withdrawal protocol as needed   All the records are reviewed and case discussed with ED provider. Management plans discussed with the patient, family and they are in agreement.  CODE STATUS:  Full code  TOTAL TIME TAKING CARE OF THIS PATIENT: 60 minutes.    Theodoro Grist M.D on 06/06/2015 at  6:46 PM  Between 7am to 6pm - Pager - [phone] After 6pm go to [email or web address] - password [email or web address] [facility]  [facility]  Office  [phone]  CC: Primary care physician; No primary care provider on file.

## 2015-06-06 NOTE — ED Notes (Signed)
Pt arrives via EMS, pt was found in house covered in stool, pt has hx of c-diff, pt alterted mental status upon arrival, pt moans and does not speak full words, pt arrives covered in stool, pt cleaned and chaned into hospital gown, EMs reports pts wife left him 2 days ago because she could not deal with him, EMS also reports pt drove to liquor store today and Troy [name] was covered in stool

## 2015-06-06 NOTE — ED Notes (Signed)
Dr. Algernon Huxley notified of critical lactic acid of 3.6, also notified of lab stating BMP was "Contaminated" and asked to redraw, MD speaking to lab on the phone

## 2015-06-06 NOTE — ED Notes (Signed)
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

## 2015-06-06 NOTE — Consult Note (Signed)
ANTIBIOTIC CONSULT NOTE - INITIAL  Pharmacy Consult for Vancomycin and Zosyn Indication: Sepsis   No Known Allergies  Patient Measurements: Height: 6' (182.9 cm) Weight: 210 lb (95.255 kg) IBW/kg (Calculated) : 77.6 Adjusted Body Weight: 84.7 kg   Vital Signs: Temp: 94.4 F (34.7 C) (06/22 2100) Temp Source: Oral (06/22 2100) BP: 94/51 mmHg (06/22 2100) Pulse Rate: 100 (06/22 2100) Intake/Output from previous day:   Intake/Output from this shift: Total I/O In: 362.5 [I.V.:262.5; IV Piggyback:100] Out: -   Labs:  Recent Labs  06/06/15 1614 06/06/15 1709 06/06/15 1710  WBC 34.7*  --  23.5*  HGB 9.6*  --  7.9*  PLT 229  --  146*  CREATININE 1.51* 1.30*  --    Estimated Creatinine Clearance: 86.9 mL/min (by C-G formula based on Cr of 1.3).  Microbiology: Recent Results (from the past 720 hour(s))  MRSA culture     Status: None   Collection Time: 05/13/15  2:26 PM  Result Value Ref Range Status   Specimen Description NASAL SWAB  Final   Report Status 06/01/2015 FINAL  Final  MRSA PCR Screening     Status: None   Collection Time: 05/13/15  2:26 PM  Result Value Ref Range Status   MRSA by PCR NEGATIVE NEGATIVE Final    Comment:        The GeneXpert MRSA Assay (FDA approved for NASAL specimens only), is one component of a comprehensive MRSA colonization surveillance program. It is not intended to diagnose MRSA infection nor to guide or monitor treatment for MRSA infections.   Blood culture (routine x 2)     Status: None   Collection Time: 05/13/15  4:30 PM  Result Value Ref Range Status   Specimen Description BLOOD  Final   Special Requests BLOOD CULTURE RECEIVED  Final   Culture NO GROWTH 5 DAYS  Final   Report Status 05/18/2015 FINAL  Final  Blood culture (routine x 2)     Status: None   Collection Time: 05/13/15  4:35 PM  Result Value Ref Range Status   Specimen Description BLOOD  Final   Special Requests BLOOD CULTURE RECEIVED  Final   Culture NO  GROWTH 5 DAYS  Final   Report Status 05/18/2015 FINAL  Final  C difficile quick scan w PCR reflex [facility])     Status: None   Collection Time: 05/14/15  5:30 PM  Result Value Ref Range Status   C Diff antigen POSITIVE  Final   C Diff toxin NEGATIVE  Final   C Diff interpretation   Final    Positive for toxigenic C. difficile, active toxin production not detected. Patient has toxigenic C. difficile organisms present in the bowel, but toxin was not detected. The patient may be a carrier or the level of toxin in the sample was below the limit  of detection. This information should be used in conjunction with the patient's clinical history when deciding on possible therapy.     Comment: CRITICAL RESULT CALLED TO, READ BACK BY AND VERIFIED WITH: AMBER THOMAS AT 2136 05/14/15 CTJ   Clostridium Difficile by PCR (not at [facility])     Status: Abnormal   Collection Time: 05/14/15  5:30 PM  Result Value Ref Range Status   C difficile by pcr POSITIVE (A) NEGATIVE Corrected    Comment: CORRECTED ON 05/31 AT 1519: PREVIOUSLY REPORTED AS Toxigenic C.diff POSITIVE  C difficile quick scan w PCR reflex ([place] only)     Status: None  Collection Time: 06/06/15  4:20 PM  Result Value Ref Range Status   C Diff antigen POSITIVE  Final   C Diff toxin NEGATIVE  Final   C Diff interpretation   Final    Positive for toxigenic C. difficile, active toxin production not detected. Patient has toxigenic C. difficile organisms present in the bowel, but toxin was not detected. The patient may be a carrier or the level of toxin in the sample was below the limit  of detection. This information should be used in conjunction with the patient's clinical history when deciding on possible therapy.     Comment: CRITICAL RESULT CALLED TO, READ BACK BY AND VERIFIED WITH: LISA THOMPSON 06/06/15 AT 2033.SDR   Clostridium Difficile by PCR (not at [facility])     Status: Abnormal   Collection Time: 06/06/15  4:20 PM  Result Value Ref Range  Status   C difficile by pcr POSITIVE (A) NEGATIVE Final    Comment: Positive for toxigenic C. difficile, active toxin production not detected. Patient has toxigenic C. difficile organisms present in the bowel, but toxin was not detected. The patient may be a carrier or the level of toxin in the sample was below the limit  of detection. This information should be used in conjunction with the patient's clinical history when deciding on possible therapy. CRITICAL RESULT CALLED TO, READ BACK BY AND VERIFIED WITH: LISA THOMPSON 06/06/15 2030. SDW     Medical History: Past Medical History  Diagnosis Date  . Liver damage   . Hypertension   . Neuropathy   . Seizures     complicated ETOH withdrawal  . Pancytopenia, acquired   . Ascites due to alcoholic cirrhosis   . DTs (delirium tremens)   . Hypothyroidism   . Hepatitis C     Medications:  Anti-infectives    Start     Dose/Rate Route Frequency Ordered Stop   06/07/15 0030  piperacillin-tazobactam (ZOSYN) IVPB 3.375 g     3.375 g 12.5 mL/hr over 240 Minutes Intravenous Every 8 hours 06/06/15 2220     06/06/15 2300  vancomycin (VANCOCIN) 1,250 mg in sodium chloride 0.9 % 250 mL IVPB     1,250 mg 166.7 mL/hr over 90 Minutes Intravenous  Once 06/06/15 2116     06/06/15 2200  rifaximin (XIFAXAN) tablet 550 mg     550 mg Oral 2 times daily 06/06/15 2116     06/06/15 2130  piperacillin-tazobactam (ZOSYN) IVPB 3.375 g  Status:  Discontinued     3.375 g 100 mL/hr over 30 Minutes Intravenous  Once 06/06/15 2116 06/06/15 2215   06/06/15 1615  piperacillin-tazobactam (ZOSYN) IVPB 3.375 g     3.375 g 100 mL/hr over 30 Minutes Intravenous  Once 06/06/15 1613 06/06/15 1707   06/06/15 1615  vancomycin (VANCOCIN) IVPB 1000 mg/200 mL premix     1,000 mg 200 mL/hr over 60 Minutes Intravenous  Once 06/06/15 1613 06/06/15 1738     Assessment: 44 yo male ordered Vancomycin and Zosyn for sepsis.   Goal of Therapy:  Vancomycin trough level 15-20  mcg/ml  Plan:  Patient given Vancomycin 1g IV in the ED at 16:38.  Ordered 1250mg  IV at 23:00 for stacked dosing, followed by Vancomycin 1250mg  IV Q12H beginning 6/23 at 11:00.  Trough level ordered prior to 5th dose on 6/24 at 10:30.    Zosyn 3.375g EI Q8H ordered.   Follow up culture results.  Pharmacy to follow and adjust per consult.    Marke Goodwyn  K, RPh Clinical Pharmacist 06/06/2015,10:45 PM

## 2015-06-07 DIAGNOSIS — E876 Hypokalemia: Secondary | ICD-10-CM | POA: Diagnosis present

## 2015-06-07 DIAGNOSIS — E871 Hypo-osmolality and hyponatremia: Secondary | ICD-10-CM | POA: Diagnosis present

## 2015-06-07 DIAGNOSIS — A0472 Enterocolitis due to Clostridium difficile, not specified as recurrent: Secondary | ICD-10-CM | POA: Diagnosis present

## 2015-06-07 DIAGNOSIS — D638 Anemia in other chronic diseases classified elsewhere: Secondary | ICD-10-CM | POA: Diagnosis present

## 2015-06-07 LAB — CBC
HCT: 22.9 % — ABNORMAL LOW (ref 40.0–52.0)
HCT: 21.3 % — ABNORMAL LOW (ref 40.0–52.0)
Hemoglobin: 7.6 g/dL — ABNORMAL LOW (ref 13.0–18.0)
Hemoglobin: 7 g/dL — ABNORMAL LOW (ref 13.0–18.0)
MCH: 33.4 pg (ref 26.0–34.0)
MCH: 33.9 pg (ref 26.0–34.0)
MCHC: 33 g/dL (ref 32.0–36.0)
MCHC: 33.1 g/dL (ref 32.0–36.0)
MCV: 101.3 fL — AB (ref 80.0–100.0)
MCV: 102.4 fL — ABNORMAL HIGH (ref 80.0–100.0)
PLATELETS: 94 10*3/uL — AB (ref 150–440)
Platelets: 95 10*3/uL — ABNORMAL LOW (ref 150–440)
RBC: 2.27 MIL/uL — AB (ref 4.40–5.90)
RBC: 2.08 MIL/uL — AB (ref 4.40–5.90)
RDW: 13.9 % (ref 11.5–14.5)
RDW: 14 % (ref 11.5–14.5)
WBC: 8.9 10*3/uL (ref 3.8–10.6)
WBC: 8.3 10*3/uL (ref 3.8–10.6)

## 2015-06-07 LAB — COMPREHENSIVE METABOLIC PANEL
ALBUMIN: 1.9 g/dL — AB (ref 3.5–5.0)
ALK PHOS: 197 U/L — AB (ref 38–126)
ALT: 12 U/L — ABNORMAL LOW (ref 17–63)
AST: 47 U/L — AB (ref 15–41)
Anion gap: 13 (ref 5–15)
BILIRUBIN TOTAL: 2 mg/dL — AB (ref 0.3–1.2)
BUN: 15 mg/dL (ref 6–20)
CHLORIDE: 101 mmol/L (ref 101–111)
CO2: 19 mmol/L — ABNORMAL LOW (ref 22–32)
CREATININE: 0.9 mg/dL (ref 0.61–1.24)
Calcium: 5.5 mg/dL — CL (ref 8.9–10.3)
GFR calc Af Amer: >60 mL/min (ref >60)
GFR calc non Af Amer: >60 mL/min (ref >60)
Glucose, Bld: 157 mg/dL — ABNORMAL HIGH (ref 65–99)
POTASSIUM: 2.1 mmol/L — AB (ref 3.5–5.1)
Sodium: 133 mmol/L — ABNORMAL LOW (ref 135–145)
Total Protein: 4.7 g/dL — ABNORMAL LOW (ref 6.5–8.1)

## 2015-06-07 LAB — MAGNESIUM
MAGNESIUM: 2.1 mg/dL (ref 1.7–2.4)
Magnesium: 1.3 mg/dL — ABNORMAL LOW (ref 1.7–2.4)

## 2015-06-07 LAB — POTASSIUM: Potassium: 2.8 mmol/L — CL (ref 3.5–5.1)

## 2015-06-07 LAB — MRSA PCR SCREENING: MRSA BY PCR: NEGATIVE

## 2015-06-07 LAB — AMMONIA: Ammonia: 17 umol/L (ref 9–35)

## 2015-06-07 LAB — PROTIME-INR
INR: 1.33
PROTHROMBIN TIME: 16.7 s — AB (ref 11.4–15.0)

## 2015-06-07 LAB — PHOSPHORUS: PHOSPHORUS: 2.4 mg/dL — AB (ref 2.5–4.6)

## 2015-06-07 MED ORDER — CALCIUM GLUCONATE 10 % IV SOLN
1.0000 g | Freq: Once | INTRAVENOUS | Status: AC
  Administered 2015-06-07: 1 g via INTRAVENOUS
  Filled 2015-06-07: qty 10

## 2015-06-07 MED ORDER — SODIUM CHLORIDE 0.9 % IV BOLUS (SEPSIS)
2000.0000 mL | Freq: Once | INTRAVENOUS | Status: AC
  Administered 2015-06-07: 2000 mL via INTRAVENOUS

## 2015-06-07 MED ORDER — POTASSIUM CHLORIDE CRYS ER 20 MEQ PO TBCR
40.0000 meq | EXTENDED_RELEASE_TABLET | Freq: Four times a day (QID) | ORAL | Status: AC
  Administered 2015-06-07 (×2): 40 meq via ORAL
  Filled 2015-06-07 (×2): qty 2

## 2015-06-07 MED ORDER — SODIUM CHLORIDE 0.9 % IV BOLUS (SEPSIS)
1000.0000 mL | Freq: Once | INTRAVENOUS | Status: AC
  Administered 2015-06-07: 1000 mL via INTRAVENOUS

## 2015-06-07 MED ORDER — MAGNESIUM SULFATE 4 GM/100ML IV SOLN: 4.0000 g | Freq: Once | INTRAVENOUS | Status: DC

## 2015-06-07 MED ORDER — VITAMIN B-1 100 MG PO TABS
100.0000 mg | ORAL_TABLET | Freq: Every day | ORAL | Status: DC
  Administered 2015-06-07 – 2015-06-21 (×15): 100 mg via ORAL
  Filled 2015-06-07 (×16): qty 1

## 2015-06-07 MED ORDER — LABETALOL HCL 5 MG/ML IV SOLN
10.0000 mg | INTRAVENOUS | Status: DC | PRN
  Filled 2015-06-07: qty 4

## 2015-06-07 MED ORDER — SODIUM CHLORIDE 0.9 % IV SOLN
Freq: Once | INTRAVENOUS | Status: AC
  Administered 2015-06-07: 19:00:00 via INTRAVENOUS
  Filled 2015-06-07: qty 20

## 2015-06-07 MED ORDER — POTASSIUM CHLORIDE 10 MEQ/100ML IV SOLN
10.0000 meq | INTRAVENOUS | Status: AC
  Administered 2015-06-07 (×6): 10 meq via INTRAVENOUS
  Filled 2015-06-07 (×6): qty 100

## 2015-06-07 MED ORDER — METRONIDAZOLE IN NACL 5-0.79 MG/ML-% IV SOLN
500.0000 mg | Freq: Three times a day (TID) | INTRAVENOUS | Status: DC
  Administered 2015-06-07 – 2015-06-12 (×17): 500 mg via INTRAVENOUS
  Filled 2015-06-07 (×23): qty 100

## 2015-06-07 MED ORDER — LORAZEPAM 2 MG/ML IJ SOLN
1.0000 mg | Freq: Four times a day (QID) | INTRAMUSCULAR | Status: AC | PRN
  Administered 2015-06-08: 17:00:00 1 mg via INTRAVENOUS
  Filled 2015-06-07: qty 1

## 2015-06-07 MED ORDER — THIAMINE HCL 100 MG/ML IJ SOLN: 100.0000 mg | Freq: Every day | INTRAMUSCULAR | Status: DC

## 2015-06-07 MED ORDER — LORAZEPAM 1 MG PO TABS: 1.0000 mg | ORAL_TABLET | Freq: Four times a day (QID) | ORAL | Status: AC | PRN

## 2015-06-07 MED ORDER — MAGNESIUM SULFATE 4 GM/100ML IV SOLN
4.0000 g | Freq: Once | INTRAVENOUS | Status: AC
  Administered 2015-06-07: 4 g via INTRAVENOUS
  Filled 2015-06-07: qty 100

## 2015-06-07 MED ORDER — VANCOMYCIN 50 MG/ML ORAL SOLUTION
500.0000 mg | Freq: Four times a day (QID) | ORAL | Status: DC
  Administered 2015-06-07 – 2015-06-12 (×21): 500 mg via ORAL
  Filled 2015-06-07 (×27): qty 10

## 2015-06-07 MED ORDER — VANCOMYCIN 50 MG/ML ORAL SOLUTION
250.0000 mg | Freq: Four times a day (QID) | ORAL | Status: DC
  Administered 2015-06-07 (×2): 250 mg via ORAL
  Filled 2015-06-07 (×6): qty 5

## 2015-06-07 MED ORDER — PANTOPRAZOLE SODIUM 40 MG IV SOLR
40.0000 mg | Freq: Two times a day (BID) | INTRAVENOUS | Status: DC
  Administered 2015-06-07 – 2015-06-14 (×14): 40 mg via INTRAVENOUS
  Filled 2015-06-07 (×16): qty 40

## 2015-06-07 MED ORDER — FOLIC ACID 1 MG PO TABS
1.0000 mg | ORAL_TABLET | Freq: Every day | ORAL | Status: DC
  Administered 2015-06-07 – 2015-06-21 (×15): 1 mg via ORAL
  Filled 2015-06-07 (×16): qty 1

## 2015-06-07 MED ORDER — VITAMIN B-1 100 MG PO TABS: 100.0000 mg | ORAL_TABLET | Freq: Every day | ORAL | Status: DC

## 2015-06-07 MED ORDER — SACCHAROMYCES BOULARDII 250 MG PO CAPS
250.0000 mg | ORAL_CAPSULE | Freq: Two times a day (BID) | ORAL | Status: DC
  Administered 2015-06-07 – 2015-06-20 (×27): 250 mg via ORAL
  Filled 2015-06-07 (×39): qty 1

## 2015-06-07 MED ORDER — ADULT MULTIVITAMIN W/MINERALS CH
1.0000 | ORAL_TABLET | Freq: Every day | ORAL | Status: DC
  Administered 2015-06-07: 1 via ORAL
  Filled 2015-06-07: qty 1

## 2015-06-07 MED ORDER — SODIUM CHLORIDE 0.9 % IV SOLN
100.0000 mg | Freq: Every day | INTRAVENOUS | Status: DC
  Filled 2015-06-07: qty 1
  Filled 2015-06-07: qty 2
  Filled 2015-06-07 (×2): qty 1

## 2015-06-07 NOTE — Progress Notes (Signed)
[place] [facility] [place] NAME: Troy [name]    MR#:  [id number]  DATE OF BIRTH:  1971/01/08  SUBJECTIVE:  CHIEF COMPLAINT:   Chief Complaint  Patient presents with  . Diarrhea   Still has significant diarrhea Hypotensive and tachycardic REVIEW OF SYSTEMS:    ROS  Due to drowziness  DRUG ALLERGIES:  No Known Allergies  VITALS:  Blood pressure 116/61, pulse 117, temperature 96.8 F (36 C), temperature source Oral, resp. rate 14, height 6' (1.829 m), weight 95.255 kg (210 lb), SpO2 100 %.  PHYSICAL EXAMINATION:   Physical Exam  GENERAL:  44 y.o.-year-old patient lying in the bed looks critically ill EYES: Pupils equal, round, reactive to light and accommodation. No scleral icterus. Extraocular muscles intact.  HEENT: Head atraumatic, normocephalic. Oropharynx and nasopharynx clear.  NECK:  Supple, no jugular venous distention. No thyroid enlargement, no tenderness.  LUNGS: Normal breath sounds bilaterally, no wheezing, rales, rhonchi. No use of accessory muscles of respiration.  CARDIOVASCULAR: S1, S2 normal. No murmurs, rubs, or gallops.  ABDOMEN: Soft, nontender, nondistended. Bowel sounds present. No organomegaly or mass.  EXTREMITIES: No cyanosis, clubbing or edema b/l.    NEUROLOGIC: Cranial nerves II through XII are intact. No focal Motor or sensory deficits b/l.   PSYCHIATRIC: The patient is drowzy SKIN: No obvious rash, lesion, or ulcer.    LABORATORY PANEL:   CBC  Recent Labs Lab 06/06/15 1710  WBC 23.5*  HGB 7.9*  HCT 23.8*  PLT 146*   ------------------------------------------------------------------------------------------------------------------  Chemistries   Recent Labs Lab 06/06/15 1614 06/06/15 1709  NA 125* 131*  K <2.0* <2.0*  CL 85* 90*  CO2 19* 21*  GLUCOSE 158* 147*  BUN 18 17  CREATININE 1.51* 1.30*  CALCIUM 6.4* 5.9*  MG 1.1*  --   AST 51*  --   ALT 13*  --   ALKPHOS 234*  --    BILITOT 2.2*  --    ------------------------------------------------------------------------------------------------------------------  Cardiac Enzymes No results for input(s): TROPONINI in the last 168 hours. ------------------------------------------------------------------------------------------------------------------  RADIOLOGY:  Dg Chest Port 1 View  06/06/2015   CLINICAL DATA:  Altered mental status.  Diarrhea.  EXAM: PORTABLE CHEST - 1 VIEW  COMPARISON:  05/13/2015  FINDINGS: The heart size and mediastinal contours are within normal limits. Both lungs are clear. The visualized skeletal structures are unremarkable.  IMPRESSION: Normal exam.   Electronically Signed   By: Lorriane Shire M.D.   On: 06/06/2015 16:38     ASSESSMENT AND PLAN:   14 m with alcohol abuse, seizures, hepatic encephalopathy here with diarrhea  * C diff with septic and hypovolemic shock On  Iv flagyl and PO vancomycin. D/C IV vancomycin and Zosyn. Avoid other abx. Bolus 2 liters NS now. Sever diarrhea.  * Severe hypokalemia Will replace aggressively with IV Kcl. Check Mg. Tele. In ICU  * Alcohol abuse Start CIWA  * Acute encephalopathy Due to acute illness Also check ammonia level. Lactulose held. Continue Rifaximin.  * Hem positive blood with anemia of chronic disease - likely from c diff Hb is stable at this time. Monitor. D/c heparin for now.  * Acute renal failure due to ATN- Improving    All the records are reviewed and case discussed with Care Management/Social Workerr. Management plans discussed with the patient, family and they are in agreement.  CODE STATUS: FULL  DVT Prophylaxis: SCDs. No heparin due to blood in stool  TOTAL CRITICAL CARE TIME  TAKING CARE OF THIS PATIENT: 35 minutes.    Hillary Bow R M.D on 06/07/2015 at 8:44 AM  Between 7am to 6pm - Pager - [phone]  After 6pm go to [email or web address] - password [email or web address] [facility]  [facility]  Office   [phone]  CC: Primary care physician; No primary care provider on file.

## 2015-06-07 NOTE — Progress Notes (Signed)
Patient's BP starting to drop 70s/30s.  MD made aware.  1L NS bolus ordered.  Will continue to monitor.

## 2015-06-07 NOTE — Plan of Care (Signed)
Problem: Problem: Bowel/Bladder Progression Goal: NO DIARRHEA Outcome: Not Progressing Patient continues to have large amounts of uncontrolled, incontinent, loose diarrhea

## 2015-06-07 NOTE — Progress Notes (Signed)
Called by pharmacy with C dif results antigen positive but toxin negative.  Possible patient is carrier, but in setting of diarrhea and sepsis with WBC 23 will start IV flagyl and oral vancomycin.  Jacqulyn Bath, MD [facility] 06/07/2015, 2:33 AM

## 2015-06-07 NOTE — Consult Note (Signed)
[facility] Infectious Disease     Reason for Consult: C diff, leukocytoiss    Referring Physician: Sudini Date of Admission:  06/06/2015   Principal Problem:   Diarrhea Active Problems:   Sepsis   Acute renal failure   Hypokalemia   Enteritis due to Clostridium difficile   Anemia of chronic disease   Hyponatremia   HPI: Troy [name] is a 44 y.o. male with a known history of recent admission for sepsis, alcohol related seizures, metabolic , hepatic encephalopathy for which he was discharged on lactulose and Xifaxan admitted 6/22  with complaints of ongoing diarrhea and feeling dehydrated and weak.  He was noted to be hypotensive with systolic blood pressure in 60s and had marked leukocytpsis.  Of note he was admitted 5/29-6/2 with sepsis and C diff.  At that time he was treated with IV abx in ICU  - dced on levo and flagyl at that time.   Since admit he has been in ICU on fluid resuscitation but not on pressors. BP improving. Diarrhea slighlyt less. WBC improved. BCX + GPR and C diff +   Past Medical History  Diagnosis Date  . Liver damage   . Hypertension   . Neuropathy   . Seizures     complicated ETOH withdrawal  . Pancytopenia, acquired   . Ascites due to alcoholic cirrhosis   . DTs (delirium tremens)   . Hypothyroidism   . Hepatitis C    Past Surgical History  Procedure Laterality Date  . Joint replacement      Left hip replacement   History  Substance Use Topics  . Smoking status: Never Smoker   . Smokeless tobacco: Not on file  . Alcohol Use: 0.0 oz/week    0 Standard drinks or equivalent per week     Comment: hx of chronic ETOH abuse   Family History  Problem Relation Age of Onset  . Crohn's disease Father     Allergies: No Known Allergies  Current antibiotics: Antibiotics Given (last 72 hours)    Date/Time Action Medication Dose Rate   06/06/15 2209 Given   rifaximin (XIFAXAN) tablet 550 mg 550 mg    06/06/15 2258 Given   vancomycin (VANCOCIN)  1,250 mg in sodium chloride 0.9 % 250 mL IVPB 1,250 mg 166.7 mL/hr   06/07/15 0040 Given   piperacillin-tazobactam (ZOSYN) IVPB 3.375 g 3.375 g 12.5 mL/hr   06/07/15 0345 Given   metroNIDAZOLE (FLAGYL) IVPB 500 mg 500 mg 100 mL/hr   06/07/15 0345 Given   vancomycin (VANCOCIN) 50 mg/mL oral solution 250 mg 250 mg    06/07/15 0617 Given   vancomycin (VANCOCIN) 50 mg/mL oral solution 250 mg 250 mg    06/07/15 0909 Given   piperacillin-tazobactam (ZOSYN) IVPB 3.375 g 3.375 g 12.5 mL/hr   06/07/15 0959 Given   rifaximin (XIFAXAN) tablet 550 mg 550 mg    06/07/15 1129 Given   metroNIDAZOLE (FLAGYL) IVPB 500 mg 500 mg 100 mL/hr   06/07/15 1129 Given   vancomycin (VANCOCIN) 50 mg/mL oral solution 500 mg 500 mg       MEDICATIONS: . allopurinol  300 mg Oral Daily  . folic acid  1 mg Oral Daily  . levothyroxine  150 mcg Oral Daily  . metronidazole  500 mg Intravenous Q8H  . pantoprazole  40 mg Oral Daily  . potassium chloride  40 mEq Oral BID  . potassium chloride  40 mEq Oral Q6H  . rifaximin  550 mg Oral  BID  . saccharomyces boulardii  250 mg Oral BID  . sodium chloride  3 mL Intravenous Q12H  . thiamine  100 mg Oral Daily   Or  . thiamine (VITAMIN B1) IVPB  100 mg Intravenous Daily  . vancomycin  500 mg Oral 4 times per day    Review of Systems - 11 systems reviewed and negative per HPI   OBJECTIVE: Temp:  [94.4 F (34.7 C)-97.3 F (36.3 C)] 97.3 F (36.3 C) (06/23 1400) Pulse Rate:  [93-126] 114 (06/23 1400) Resp:  [13-24] 16 (06/23 1400) BP: (68-217)/(44-199) 118/78 mmHg (06/23 1400) SpO2:  [94 %-100 %] 100 % (06/23 1400) FiO2 (%):  [2 %] 2 % (06/23 0800) Physical Exam  Constitutional: obese, somewhat confused, chornically ill appearing  HENT:  Mouth/Throat: Oropharynx is clear and dry. No oropharyngeal exudate.  Cardiovascular: tachy  Pulmonary/Chest: Effort normal and breath sounds normal. No respiratory distress. He has no wheezes.  Abdominal: Soft. Distended,  mild diffuse ttp Bowel sounds are normal.  Lymphadenopathy:  He has no cervical adenopathy.  Neurological: He is awake but groggy and poor historian, + asterisix Skin: Skin is warm and dry. No rash noted. No erythema.  Psychiatric: groggy  LABS: Results for orders placed or performed during the hospital encounter of 06/06/15 (from the past 48 hour(s))  Glucose, capillary     Status: Abnormal   Collection Time: 06/06/15  4:05 PM  Result Value Ref Range   Glucose-Capillary 153 (H) 65 - 99 mg/dL   Comment 1 Notify RN   CBC     Status: Abnormal   Collection Time: 06/06/15  4:14 PM  Result Value Ref Range   WBC 34.7 (H) 3.8 - 10.6 K/uL   RBC 2.87 (L) 4.40 - 5.90 MIL/uL   Hemoglobin 9.6 (L) 13.0 - 18.0 g/dL   HCT 28.4 (L) 40.0 - 52.0 %   MCV 99.0 80.0 - 100.0 fL   MCH 33.5 26.0 - 34.0 pg   MCHC 33.9 32.0 - 36.0 g/dL   RDW 14.2 11.5 - 14.5 %   Platelets 229 150 - 440 K/uL  Urinalysis complete, with microscopic ([facility] only)     Status: Abnormal   Collection Time: 06/06/15  4:14 PM  Result Value Ref Range   Color, Urine YELLOW (A) YELLOW   APPearance CLEAR (A) CLEAR   Glucose, UA NEGATIVE NEGATIVE mg/dL   Bilirubin Urine NEGATIVE NEGATIVE   Ketones, ur NEGATIVE NEGATIVE mg/dL   Specific Gravity, Urine 1.009 1.005 - 1.030   Hgb urine dipstick NEGATIVE NEGATIVE   pH 6.0 5.0 - 8.0   Protein, ur NEGATIVE NEGATIVE mg/dL   Nitrite NEGATIVE NEGATIVE   Leukocytes, UA NEGATIVE NEGATIVE   RBC / HPF NONE SEEN 0 - 5 RBC/hpf   WBC, UA 0-5 0 - 5 WBC/hpf   Bacteria, UA NONE SEEN NONE SEEN   Squamous Epithelial / LPF 0-5 (A) NONE SEEN   Mucous PRESENT    Hyaline Casts, UA PRESENT   Lactic acid, plasma     Status: Abnormal   Collection Time: 06/06/15  4:14 PM  Result Value Ref Range   Lactic Acid, Venous 3.6 (HH) 0.5 - 2.0 mmol/L    Comment: CRITICAL RESULT CALLED TO, READ BACK BY AND VERIFIED WITH OLIVIA BROOMER AT 1712 06/06/2015 BY TFK   Ethanol     Status: Abnormal   Collection Time:  06/06/15  4:14 PM  Result Value Ref Range   Alcohol, Ethyl (B) 398 (HH) <5 mg/dL  Comment: CRITICAL RESULT CALLED TO, READ BACK BY AND VERIFIED WITH OLIVIA BROOMER AT 1754 06/06/2015 BY TFK        LOWEST DETECTABLE LIMIT FOR SERUM ALCOHOL IS 5 mg/dL FOR MEDICAL PURPOSES ONLY   Stool culture     Status: None (Preliminary result)   Collection Time: 06/06/15  4:14 PM  Result Value Ref Range   Specimen Description STOOL    Special Requests Normal    Culture HOLDING FOR POSSIBLE PATHOGEN    Report Status PENDING   Comprehensive metabolic panel     Status: Abnormal   Collection Time: 06/06/15  4:14 PM  Result Value Ref Range   Sodium 125 (L) 135 - 145 mmol/L   Potassium <2.0 (LL) 3.5 - 5.1 mmol/L    Comment: CRITICAL RESULT CALLED TO, READ BACK BY AND VERIFIED WITH: TO DR. SCHAEVITZ AT 1715 06/06/2015    Chloride 85 (L) 101 - 111 mmol/L   CO2 19 (L) 22 - 32 mmol/L   Glucose, Bld 158 (H) 65 - 99 mg/dL   BUN 18 6 - 20 mg/dL   Creatinine, Ser 1.51 (H) 0.61 - 1.24 mg/dL   Calcium 6.4 (LL) 8.9 - 10.3 mg/dL    Comment: CRITICAL RESULT CALLED TO, READ BACK BY AND VERIFIED WITH: TO DR. SCHAEVITZ AT 1715 06/06/2015 BY TFK    Total Protein 6.2 (L) 6.5 - 8.1 g/dL   Albumin 2.4 (L) 3.5 - 5.0 g/dL   AST 51 (H) 15 - 41 U/L   ALT 13 (L) 17 - 63 U/L   Alkaline Phosphatase 234 (H) 38 - 126 U/L   Total Bilirubin 2.2 (H) 0.3 - 1.2 mg/dL   GFR calc non Af Amer 55 (L) >60 mL/min   GFR calc Af Amer >60 >60 mL/min    Comment: (NOTE) The eGFR has been calculated using the CKD EPI equation. This calculation has not been validated in all clinical situations. eGFR's persistently <60 mL/min signify possible Chronic Kidney Disease.    Anion gap 21 (H) 5 - 15  Magnesium     Status: Abnormal   Collection Time: 06/06/15  4:14 PM  Result Value Ref Range   Magnesium 1.1 (L) 1.7 - 2.4 mg/dL  Occult blood card to lab, stool     Status: Abnormal   Collection Time: 06/06/15  4:14 PM  Result Value Ref  Range   Fecal Occult Bld POSITIVE (A) NEGATIVE  C difficile quick scan w PCR reflex ([facility] only)     Status: None   Collection Time: 06/06/15  4:20 PM  Result Value Ref Range   C Diff antigen POSITIVE    C Diff toxin NEGATIVE    C Diff interpretation      Positive for toxigenic C. difficile, active toxin production not detected. Patient has toxigenic C. difficile organisms present in the bowel, but toxin was not detected. The patient may be a carrier or the level of toxin in the sample was below the limit  of detection. This information should be used in conjunction with the patient's clinical history when deciding on possible therapy.     Comment: CRITICAL RESULT CALLED TO, READ BACK BY AND VERIFIED WITH: LISA THOMPSON 06/06/15 AT 2033.SDR   Blood Culture (routine x 2)     Status: None (Preliminary result)   Collection Time: 06/06/15  4:20 PM  Result Value Ref Range   Specimen Description BLOOD    Special Requests NONE    Culture  Setup Time  GRAM POSITIVE RODS ANAEROBIC BOTTLE ONLY CRITICAL RESULT CALLED TO, READ BACK BY AND VERIFIED WITH: Kathi Simpers AT 4315 06/07/15 DV    Culture GRAM POSITIVE RODS IDENTIFICATION TO FOLLOW     Report Status PENDING   Type and screen     Status: None   Collection Time: 06/06/15  4:20 PM  Result Value Ref Range   ABO/RH(D) O POS    Antibody Screen NEG    Sample Expiration 06/09/2015   Clostridium Difficile by PCR (not at [facility])     Status: Abnormal   Collection Time: 06/06/15  4:20 PM  Result Value Ref Range   C difficile by pcr POSITIVE (A) NEGATIVE    Comment: Positive for toxigenic C. difficile, active toxin production not detected. Patient has toxigenic C. difficile organisms present in the bowel, but toxin was not detected. The patient may be a carrier or the level of toxin in the sample was below the limit  of detection. This information should be used in conjunction with the patient's clinical history when deciding on  possible therapy. CRITICAL RESULT CALLED TO, READ BACK BY AND VERIFIED WITH: LISA THOMPSON 06/06/15 2030. SDW   ABO/Rh     Status: None   Collection Time: 06/06/15  4:21 PM  Result Value Ref Range   ABO/RH(D) O POS   Basic metabolic panel     Status: Abnormal   Collection Time: 06/06/15  5:09 PM  Result Value Ref Range   Sodium 131 (L) 135 - 145 mmol/L   Potassium <2.0 (LL) 3.5 - 5.1 mmol/L    Comment: CRITICAL RESULT CALLED TO, READ BACK BY AND VERIFIED WITH OLIVIA BROOMER AT 1743 06/06/2015 BY TFK    Chloride 90 (L) 101 - 111 mmol/L   CO2 21 (L) 22 - 32 mmol/L   Glucose, Bld 147 (H) 65 - 99 mg/dL   BUN 17 6 - 20 mg/dL   Creatinine, Ser 1.30 (H) 0.61 - 1.24 mg/dL   Calcium 5.9 (LL) 8.9 - 10.3 mg/dL    Comment: CRITICAL RESULT CALLED TO, READ BACK BY AND VERIFIED WITH OLIVIA BROOMER AT 1743 06/06/2015 BY TFK    GFR calc non Af Amer >60 >60 mL/min   GFR calc Af Amer >60 >60 mL/min    Comment: (NOTE) The eGFR has been calculated using the CKD EPI equation. This calculation has not been validated in all clinical situations. eGFR's persistently <60 mL/min signify possible Chronic Kidney Disease.    Anion gap 20 (H) 5 - 15  CBC     Status: Abnormal   Collection Time: 06/06/15  5:10 PM  Result Value Ref Range   WBC 23.5 (H) 3.8 - 10.6 K/uL   RBC 2.38 (L) 4.40 - 5.90 MIL/uL   Hemoglobin 7.9 (L) 13.0 - 18.0 g/dL   HCT 23.8 (L) 40.0 - 52.0 %   MCV 100.0 80.0 - 100.0 fL   MCH 33.3 26.0 - 34.0 pg   MCHC 33.3 32.0 - 36.0 g/dL   RDW 13.9 11.5 - 14.5 %   Platelets 146 (L) 150 - 440 K/uL  Glucose, capillary     Status: Abnormal   Collection Time: 06/06/15  8:55 PM  Result Value Ref Range   Glucose-Capillary 122 (H) 65 - 99 mg/dL  MRSA PCR Screening     Status: None   Collection Time: 06/06/15  9:00 PM  Result Value Ref Range   MRSA by PCR NEGATIVE NEGATIVE    Comment:  The GeneXpert MRSA Assay (FDA approved for NASAL specimens only), is one component of a comprehensive  MRSA colonization surveillance program. It is not intended to diagnose MRSA infection nor to guide or monitor treatment for MRSA infections.   CBC     Status: Abnormal   Collection Time: 06/07/15  7:36 AM  Result Value Ref Range   WBC 8.9 3.8 - 10.6 K/uL   RBC 2.27 (L) 4.40 - 5.90 MIL/uL   Hemoglobin 7.6 (L) 13.0 - 18.0 g/dL   HCT 22.9 (L) 40.0 - 52.0 %   MCV 101.3 (H) 80.0 - 100.0 fL   MCH 33.4 26.0 - 34.0 pg   MCHC 33.0 32.0 - 36.0 g/dL   RDW 13.9 11.5 - 14.5 %   Platelets 95 (L) 150 - 440 K/uL  Comprehensive metabolic panel     Status: Abnormal   Collection Time: 06/07/15  7:36 AM  Result Value Ref Range   Sodium 133 (L) 135 - 145 mmol/L    Comment: RESULT REPEATED AND VERIFIED Andria Meuse AT 2233 06/07/2015 [facility]    Potassium 2.1 (LL) 3.5 - 5.1 mmol/L    Comment: CRITICAL RESULT CALLED TO, READ BACK BY AND VERIFIED WITH: RESULT REPEATED AND VERIFIED BRITTANY KILLINGSWORTH AT 0845 ON 06/07/2015 [facility]    Chloride 101 101 - 111 mmol/L   CO2 19 (L) 22 - 32 mmol/L   Glucose, Bld 157 (H) 65 - 99 mg/dL   BUN 15 6 - 20 mg/dL   Creatinine, Ser 0.90 0.61 - 1.24 mg/dL   Calcium 5.5 (LL) 8.9 - 10.3 mg/dL    Comment: CRITICAL RESULT CALLED TO, READ BACK BY AND VERIFIED WITH: RESULT REPEATED AND VERIFIED BRITTANY KILLINGSWORTH AT 0845 ON 06/07/2015 [facility]    Total Protein 4.7 (L) 6.5 - 8.1 g/dL   Albumin 1.9 (L) 3.5 - 5.0 g/dL   AST 47 (H) 15 - 41 U/L   ALT 12 (L) 17 - 63 U/L   Alkaline Phosphatase 197 (H) 38 - 126 U/L   Total Bilirubin 2.0 (H) 0.3 - 1.2 mg/dL   GFR calc non Af Amer >60 >60 mL/min   GFR calc Af Amer >60 >60 mL/min    Comment: (NOTE) The eGFR has been calculated using the CKD EPI equation. This calculation has not been validated in all clinical situations. eGFR's persistently <60 mL/min signify possible Chronic Kidney Disease.    Anion gap 13 5 - 15  Magnesium     Status: Abnormal   Collection Time: 06/07/15  7:36 AM  Result Value Ref Range    Magnesium 1.3 (L) 1.7 - 2.4 mg/dL  Phosphorus     Status: Abnormal   Collection Time: 06/07/15  7:36 AM  Result Value Ref Range   Phosphorus 2.4 (L) 2.5 - 4.6 mg/dL  Ammonia     Status: None   Collection Time: 06/07/15  9:36 AM  Result Value Ref Range   Ammonia 17 9 - 35 umol/L  Magnesium     Status: None   Collection Time: 06/07/15  3:08 PM  Result Value Ref Range   Magnesium 2.1 1.7 - 2.4 mg/dL   No components found for: ESR, C REACTIVE PROTEIN MICRO: Recent Results (from the past 720 hour(s))  MRSA culture     Status: None   Collection Time: 05/13/15  2:26 PM  Result Value Ref Range Status   Specimen Description NASAL SWAB  Final   Report Status 06/01/2015 FINAL  Final  MRSA PCR Screening  Status: None   Collection Time: 05/13/15  2:26 PM  Result Value Ref Range Status   MRSA by PCR NEGATIVE NEGATIVE Final    Comment:        The GeneXpert MRSA Assay (FDA approved for NASAL specimens only), is one component of a comprehensive MRSA colonization surveillance program. It is not intended to diagnose MRSA infection nor to guide or monitor treatment for MRSA infections.   Blood culture (routine x 2)     Status: None   Collection Time: 05/13/15  4:30 PM  Result Value Ref Range Status   Specimen Description BLOOD  Final   Special Requests BLOOD CULTURE RECEIVED  Final   Culture NO GROWTH 5 DAYS  Final   Report Status 05/18/2015 FINAL  Final  Blood culture (routine x 2)     Status: None   Collection Time: 05/13/15  4:35 PM  Result Value Ref Range Status   Specimen Description BLOOD  Final   Special Requests BLOOD CULTURE RECEIVED  Final   Culture NO GROWTH 5 DAYS  Final   Report Status 05/18/2015 FINAL  Final  C difficile quick scan w PCR reflex [facility])     Status: None   Collection Time: 05/14/15  5:30 PM  Result Value Ref Range Status   C Diff antigen POSITIVE  Final   C Diff toxin NEGATIVE  Final   C Diff interpretation   Final    Positive for toxigenic C.  difficile, active toxin production not detected. Patient has toxigenic C. difficile organisms present in the bowel, but toxin was not detected. The patient may be a carrier or the level of toxin in the sample was below the limit  of detection. This information should be used in conjunction with the patient's clinical history when deciding on possible therapy.     Comment: CRITICAL RESULT CALLED TO, READ BACK BY AND VERIFIED WITH: AMBER THOMAS AT 2136 05/14/15 [facility]   Clostridium Difficile by PCR (not at [facility])     Status: Abnormal   Collection Time: 05/14/15  5:30 PM  Result Value Ref Range Status   C difficile by pcr POSITIVE (A) NEGATIVE Corrected    Comment: CORRECTED ON 05/31 AT 1519: PREVIOUSLY REPORTED AS Toxigenic C.diff POSITIVE  Stool culture     Status: None (Preliminary result)   Collection Time: 06/06/15  4:14 PM  Result Value Ref Range Status   Specimen Description STOOL  Final   Special Requests Normal  Final   Culture HOLDING FOR POSSIBLE PATHOGEN  Final   Report Status PENDING  Incomplete  C difficile quick scan w PCR reflex ([facility] only)     Status: None   Collection Time: 06/06/15  4:20 PM  Result Value Ref Range Status   C Diff antigen POSITIVE  Final   C Diff toxin NEGATIVE  Final   C Diff interpretation   Final    Positive for toxigenic C. difficile, active toxin production not detected. Patient has toxigenic C. difficile organisms present in the bowel, but toxin was not detected. The patient may be a carrier or the level of toxin in the sample was below the limit  of detection. This information should be used in conjunction with the patient's clinical history when deciding on possible therapy.     Comment: CRITICAL RESULT CALLED TO, READ BACK BY AND VERIFIED WITH: LISA THOMPSON 06/06/15 AT 2033.SDR   Blood Culture (routine x 2)     Status: None (Preliminary result)   Collection Time:  06/06/15  4:20 PM  Result Value Ref Range Status   Specimen Description BLOOD  Final    Special Requests NONE  Final   Culture  Setup Time   Final    GRAM POSITIVE RODS ANAEROBIC BOTTLE ONLY CRITICAL RESULT CALLED TO, READ BACK BY AND VERIFIED WITH: Kathi Simpers AT 4650 06/07/15 DV    Culture GRAM POSITIVE RODS IDENTIFICATION TO FOLLOW   Final   Report Status PENDING  Incomplete  Clostridium Difficile by PCR (not at [facility])     Status: Abnormal   Collection Time: 06/06/15  4:20 PM  Result Value Ref Range Status   C difficile by pcr POSITIVE (A) NEGATIVE Final    Comment: Positive for toxigenic C. difficile, active toxin production not detected. Patient has toxigenic C. difficile organisms present in the bowel, but toxin was not detected. The patient may be a carrier or the level of toxin in the sample was below the limit  of detection. This information should be used in conjunction with the patient's clinical history when deciding on possible therapy. CRITICAL RESULT CALLED TO, READ BACK BY AND VERIFIED WITH: LISA THOMPSON 06/06/15 2030. SDW   MRSA PCR Screening     Status: None   Collection Time: 06/06/15  9:00 PM  Result Value Ref Range Status   MRSA by PCR NEGATIVE NEGATIVE Final    Comment:        The GeneXpert MRSA Assay (FDA approved for NASAL specimens only), is one component of a comprehensive MRSA colonization surveillance program. It is not intended to diagnose MRSA infection nor to guide or monitor treatment for MRSA infections.     IMAGING: US Abdomen Limited  05/15/2015   CLINICAL DATA:  Evaluate for ascites  EXAM: LIMITED ABDOMINAL ULTRASOUND  COMPARISON:  None.  FINDINGS: Only a very trace amount of ascites is present in the right and left lower quadrants. The bladder is distended.  IMPRESSION: Trace ascites is present.  Paracentesis was not performed.  Bladder is distended.   Electronically Signed   By: Marybelle Killings M.D.   On: 05/15/2015 11:43   Dg Chest Port 1 View  06/06/2015   CLINICAL DATA:  Altered mental status.  Diarrhea.  EXAM:  PORTABLE CHEST - 1 VIEW  COMPARISON:  05/13/2015  FINDINGS: The heart size and mediastinal contours are within normal limits. Both lungs are clear. The visualized skeletal structures are unremarkable.  IMPRESSION: Normal exam.   Electronically Signed   By: Lorriane Shire M.D.   On: 06/06/2015 16:38   Dg Chest Portable 1 View  05/13/2015   CLINICAL DATA:  Generalized weakness today.  EXAM: PORTABLE CHEST - 1 VIEW  COMPARISON:  Single view of the chest 02/11/2014 and 02/06/2014.  FINDINGS: The lungs are clear. Heart size is normal. There is no pneumothorax or pleural fluid. No focal bony abnormality is identified.  IMPRESSION: Negative chest.   Electronically Signed   By: Inge Rise M.D.   On: 05/13/2015 17:43    Assessment:    Troy [name] is a 44 y.o. male with a known history of recent admission for sepsis, alcohol related seizures, metabolic , hepatic encephalopathy for which he was discharged on lactulose and Xifaxan admitted 6/22  with complaints of ongoing diarrhea and feeling dehydrated and weak.  He was noted to be hypotensive with systolic blood pressure in 60s and had marked leukocytpsis.  Of note he was admitted 5/29-6/2 with sepsis and C diff.  At that time he was treated  with IV abx in ICU  - dced on levo and flagyl at that time.  He appears to be continuing to drink with + etoh level on admission.  Since admit he has been in ICU on fluid resuscitation but not on pressors. BP improving. Diarrhea slighlyt less. WBC improved. BCX + GPR and C diff +  I suspect this is all C diff although could have SBP or other infection.   Recommendations Continue IV flagyl and oral vanco (there is some data that critically ill patients with C diff do better with this double coverage initially) Stop other abx Follow BCX If worsens would repeat bcx, check diagnostic paracentesis to eval for SBP and consider CT abd prior to starting other abx.   Thank you very much for allowing me to participate  in the care of this patient. Please call with questions.   Cheral Marker. Ola Spurr, MD

## 2015-06-07 NOTE — Clinical Social Work Note (Signed)
Patient is known to CSW from a recent previous admission. At that time, patient and his wife were living together along with their stepdaughter. DSS Adult Protective Services: [name]: [phone] arrived to [facility] and informed CSW today that patient was found in his home covered in feces with two empty large bottles of vodka on either side of him. Evlyn Clines stated that since patient's last admission to the hospital, patient's wife left him and he was alone in the home. Evlyn Clines stated that patient has been calling the fire department multiple times over the past few weeks asking to get him up after he falls and asking for them to bathe him. CSW will complete assessment with patient and continue to keep DSS informed of progress. Shela Leff MSW,LCSWA [phone]

## 2015-06-07 NOTE — Plan of Care (Signed)
Problem: Problem: Bowel/Bladder Progression Goal: PATIENT IS CONTINENT Outcome: Not Progressing Patient states that he is only sometimes aware of when he has a BM but most of the time, he is unaware and it is uncontrolled.

## 2015-06-07 NOTE — Progress Notes (Signed)
@   0845 spoke with Dr. Darvin Neighbours with critical results including potassium of 2.1, Calcium 5.5, and sodium 133

## 2015-06-07 NOTE — Consult Note (Signed)
GI Inpatient Consult Note  Reason for Consult: severe c.diff induced diarrhea   Attending Requesting Consult: Willis  History of Present Illness: Troy [name] is a 44 y.o. 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 Delayed also had a presentation for diarrhea in late May and was treated for C. difficile at that time. Reports several days of worsening diarrhea prompting presentation to the emergency room. He was found to be hypotensive and tachycardic and with significant electrolyte abnormalities. Was admitted to the ICU for further management. C. difficile PCR is positive.  He is currently being treated with by mouth vancomycin and IV Flagyl. He has a rectal tube in place with copious liquid and that rectal tube. He has so far not required pressors. He does admit to recent alcohol use. He is unfortunately not attending Alcoholics Anonymous or counseling. He was previously alcohol abstinent but has been relapsing. He denies any rectal bleeding or melena. Also denies abdominal pain, increased abdominal swelling.  Past Medical History:  Past Medical History  Diagnosis Date  . Liver damage   . Hypertension   . Neuropathy   . Seizures     complicated ETOH withdrawal  . Pancytopenia, acquired   . Ascites due to alcoholic cirrhosis   . DTs (delirium tremens)   . Hypothyroidism   . Hepatitis C     Problem List: Patient Active Problem List   Diagnosis Date Noted  . Hypokalemia 06/07/2015  . Enteritis due to Clostridium difficile 06/07/2015  . Anemia of chronic disease 06/07/2015  . Hyponatremia 06/07/2015  . Diarrhea 06/06/2015  . Sepsis 06/06/2015  . Acute renal failure 06/06/2015  . Alcohol withdrawal seizure 05/13/2015  . Lactic acidosis 05/13/2015  . Leukocytosis 05/13/2015    Past Surgical History: Past Surgical History  Procedure  Laterality Date  . Joint replacement      Left hip replacement    Allergies: No Known Allergies  Home Medications: Prescriptions prior to admission  Medication Sig Dispense Refill Last Dose  . folic acid (FOLVITE) 1 MG tablet Take 1 tablet (1 mg total) by mouth daily. 30 tablet 0 unknown at unknown  . furosemide (LASIX) 40 MG tablet Take 1 tablet (40 mg total) by mouth daily. 30 tablet 0 unknown at unknown  . nadolol (CORGARD) 40 MG tablet Take 1 tablet (40 mg total) by mouth daily. 30 tablet 0 unknown at unknown  . potassium chloride SA (K-DUR,KLOR-CON) 20 MEQ tablet Take 2 tablets (40 mEq total) by mouth 2 (two) times daily. 20 tablet 0 unknown at unknown  . pregabalin (LYRICA) 150 MG capsule Take 150 mg by mouth 2 (two) times daily as needed (for nerve pain).   PRN at PRN  . QUEtiapine (SEROQUEL) 50 MG tablet Take 50 mg by mouth at bedtime as needed (for sleep).   PRN at PRN  . thiamine 100 MG tablet Take 1 tablet (100 mg total) by mouth daily. 30 tablet 0 unknown at unknown   Home medication reconciliation was completed with the patient.   Scheduled Inpatient Medications:   . allopurinol  300 mg Oral Daily  . folic acid  1 mg Oral Daily  . levothyroxine  150 mcg Oral Daily  . metronidazole  500 mg Intravenous Q8H  . pantoprazole  40 mg Oral Daily  . potassium chloride  40 mEq Oral BID  . potassium chloride  10 mEq Intravenous  Q1 Hr x 6  . potassium chloride  40 mEq Oral Q6H  . rifaximin  550 mg Oral BID  . saccharomyces boulardii  250 mg Oral BID  . sodium chloride  3 mL Intravenous Q12H  . thiamine  100 mg Oral Daily   Or  . thiamine (VITAMIN B1) IVPB  100 mg Intravenous Daily  . vancomycin  500 mg Oral 4 times per day    Continuous Inpatient Infusions:   . 0.9 % NaCl with KCl 20 mEq / L 125 mL/hr at 06/07/15 0827    PRN Inpatient Medications:  acetaminophen **OR** acetaminophen, gabapentin, labetalol, LORazepam **OR** LORazepam, pregabalin, QUEtiapine  Family  History: family history includes Crohn's disease in his father.  The patient's family history is negative for inflammatory bowel disorders, GI malignancy, or solid organ transplantation.  Social History:   reports that he has never smoked. He does not have any smokeless tobacco history on file. He reports that he drinks alcohol. The patient denies ETOH, tobacco, or drug use.   Review of Systems: Constitutional: +wt gain Eyes: No changes in vision. ENT: No oral lesions, sore throat.  GI: see HPI.  Heme/Lymph: +easy bruising CV: No chest pain.  GU: No hematuria.  Integumentary: No rashes.  Neuro: No headaches.  Psych: +anxiety Endocrine: No heat/cold intolerance.  Allergic/Immunologic: No urticaria.  Resp: No cough, SOB.  Musculoskeletal: No joint swelling.    Physical Examination: BP 118/78 mmHg  Pulse 114  Temp(Src) 97.3 F (36.3 C) (Oral)  Resp 16  Ht 6' (1.829 m)  Wt 210 lb (95.255 kg)  BMI 28.47 kg/m2  SpO2 100% Gen: NAD, alert and oriented x 3 HEENT: PEERLA, EOMI, Neck: supple, no JVD or thyromegaly Chest: CTA bilaterally, no wheezes, crackles, or other adventitious sounds CV: RRR, no m/g/c/r Abd: + distended but not tense, + Diffuse TTP, decreased bowel sounds, no r/g Ext: +mild edema bilat.  well perfused with 2+ pulses, Skin: no rash or lesions noted Lymph: no LAD  Data: Lab Results  Component Value Date   WBC 8.9 06/07/2015   HGB 7.6* 06/07/2015   HCT 22.9* 06/07/2015   MCV 101.3* 06/07/2015   PLT 95* 06/07/2015    Recent Labs Lab 06/06/15 1614 06/06/15 1710 06/07/15 0736  HGB 9.6* 7.9* 7.6*   Lab Results  Component Value Date   NA 133* 06/07/2015   K 2.1* 06/07/2015   CL 101 06/07/2015   CO2 19* 06/07/2015   BUN 15 06/07/2015   CREATININE 0.90 06/07/2015   Lab Results  Component Value Date   ALT 12* 06/07/2015   AST 47* 06/07/2015   ALKPHOS 197* 06/07/2015   BILITOT 2.0* 06/07/2015   No results for input(s): APTT, INR, PTT in the  last 168 hours.   Assessment/Plan: Troy [name] is a 44 y.o. 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 He has severe hemodynamic changes consistent with sepsis and is positive for C. difficile. I do agree with treating for severe C. difficile with by mouth vancomycin 500 every 6 and IV Flagyl 500 every 8. Would continue aggressive fluid resuscitation as you are doing. Management of gram-positive bacteremia per Dr. Ola Spurr. Would also perform ascites search and diagnostic paracentesis if there is a pocket of ascites fluid. His hemoglobin is also trending down and and may need upper endoscopy to evaluate for a source of bleeding.  Recommendations: - agree with PO vanc and  IV flagyl - aggressive fluid resuscitation - EGD if Hgb trends down further or stool darkens.  - chang protonix to 40 IV q12 - ascites search - cont xifaxin 550 TID  Thank you for the consult. Please call with questions or concerns.  Milayah Krell, Grace Blight, MD

## 2015-06-07 NOTE — Progress Notes (Signed)
Spoke with Dr. Mortimer Fries in person in regards to gram positive rods in anaerobic blood culture. @1030 

## 2015-06-08 ENCOUNTER — Encounter
Admission: EM | Disposition: A | Discharge status: Home / Self Care | Payer: Self-pay | Source: Home / Self Care | Attending: Internal Medicine

## 2015-06-08 ENCOUNTER — Inpatient Hospital Stay: Payer: 59

## 2015-06-08 LAB — COMPREHENSIVE METABOLIC PANEL
ALBUMIN: 1.7 g/dL — AB (ref 3.5–5.0)
ALK PHOS: 201 U/L — AB (ref 38–126)
ALT: 11 U/L — ABNORMAL LOW (ref 17–63)
AST: 41 U/L (ref 15–41)
Anion gap: 6 (ref 5–15)
BILIRUBIN TOTAL: 1.2 mg/dL (ref 0.3–1.2)
BUN: 9 mg/dL (ref 6–20)
CO2: 16 mmol/L — AB (ref 22–32)
CREATININE: 0.91 mg/dL (ref 0.61–1.24)
Calcium: 6.2 mg/dL — CL (ref 8.9–10.3)
Chloride: 112 mmol/L — ABNORMAL HIGH (ref 101–111)
GFR calc Af Amer: >60 mL/min (ref >60)
GLUCOSE: 93 mg/dL (ref 65–99)
POTASSIUM: 3.7 mmol/L (ref 3.5–5.1)
SODIUM: 134 mmol/L — AB (ref 135–145)
Total Protein: 4.4 g/dL — ABNORMAL LOW (ref 6.5–8.1)

## 2015-06-08 LAB — PHOSPHORUS: PHOSPHORUS: 1.5 mg/dL — AB (ref 2.5–4.6)

## 2015-06-08 LAB — CBC
HEMATOCRIT: 21.2 % — AB (ref 40.0–52.0)
Hemoglobin: 7.1 g/dL — ABNORMAL LOW (ref 13.0–18.0)
MCH: 33.9 pg (ref 26.0–34.0)
MCHC: 33.2 g/dL (ref 32.0–36.0)
MCV: 102.2 fL — ABNORMAL HIGH (ref 80.0–100.0)
Platelets: 58 10*3/uL — ABNORMAL LOW (ref 150–440)
RBC: 2.08 MIL/uL — ABNORMAL LOW (ref 4.40–5.90)
RDW: 14.1 % (ref 11.5–14.5)
WBC: 6.4 10*3/uL (ref 3.8–10.6)

## 2015-06-08 LAB — GI PATHOGEN PANEL BY PCR, STOOL
CRYPTOSPORIDIUM BY PCR: NOT DETECTED
Campylobacter by PCR: NOT DETECTED
E COLI (ETEC) LT/ST: NOT DETECTED
E COLI (STEC): NOT DETECTED
E COLI 0157 BY PCR: NOT DETECTED
G LAMBLIA BY PCR: NOT DETECTED
Norovirus GI/GII: NOT DETECTED
Rotavirus A by PCR: NOT DETECTED
Salmonella by PCR: NOT DETECTED
Shigella by PCR: NOT DETECTED

## 2015-06-08 LAB — MAGNESIUM: Magnesium: 1.6 mg/dL — ABNORMAL LOW (ref 1.7–2.4)

## 2015-06-08 SURGERY — ESOPHAGOGASTRODUODENOSCOPY (EGD) WITH PROPOFOL
Anesthesia: General

## 2015-06-08 MED ORDER — CETYLPYRIDINIUM CHLORIDE 0.05 % MT LIQD
7.0000 mL | Freq: Two times a day (BID) | OROMUCOSAL | Status: DC
  Administered 2015-06-08 – 2015-06-13 (×6): 7 mL via OROMUCOSAL

## 2015-06-08 MED ORDER — SODIUM CHLORIDE 0.9 % IV SOLN
Freq: Once | INTRAVENOUS | Status: AC
  Administered 2015-06-08: 17:00:00 via INTRAVENOUS

## 2015-06-08 MED ORDER — MAGNESIUM SULFATE 4 GM/100ML IV SOLN
4.0000 g | Freq: Once | INTRAVENOUS | Status: AC
  Administered 2015-06-08: 4 g via INTRAVENOUS
  Filled 2015-06-08: qty 100

## 2015-06-08 MED ORDER — SODIUM CHLORIDE 0.9 % IV SOLN
1.0000 g | Freq: Once | INTRAVENOUS | Status: AC
  Administered 2015-06-08: 1 g via INTRAVENOUS
  Filled 2015-06-08: qty 10

## 2015-06-08 MED ORDER — POTASSIUM PHOSPHATES 15 MMOLE/5ML IV SOLN
30.0000 mmol | Freq: Once | INTRAVENOUS | Status: AC
  Administered 2015-06-08: 30 mmol via INTRAVENOUS
  Filled 2015-06-08: qty 10

## 2015-06-08 MED ORDER — OXYCODONE HCL 5 MG PO TABS
5.0000 mg | ORAL_TABLET | Freq: Four times a day (QID) | ORAL | Status: DC | PRN
  Administered 2015-06-08 – 2015-06-12 (×5): 5 mg via ORAL
  Filled 2015-06-08 (×5): qty 1

## 2015-06-08 MED ORDER — MAGNESIUM OXIDE 400 (241.3 MG) MG PO TABS
400.0000 mg | ORAL_TABLET | Freq: Every day | ORAL | Status: AC
  Administered 2015-06-08 – 2015-06-09 (×2): 400 mg via ORAL
  Filled 2015-06-08 (×2): qty 1

## 2015-06-08 MED ORDER — SODIUM CHLORIDE 0.9 % IV SOLN: INTRAVENOUS | Status: DC

## 2015-06-08 NOTE — Clinical Social Work Note (Signed)
Clinical Social Work Assessment  Patient Details  Name: Troy [name] MRN: [id number] Date of Birth: [birth year]  Date of referral:  06/08/15               Reason for consult:   (ETOH abuse)                Permission sought to share information with:  Other Permission granted to share information::     Name::        Agency::     Relationship::     Contact Information:     Housing/Transportation Living arrangements for the past 2 months:   (home) Source of Information:  Patient Patient Interpreter Needed:  None Criminal Activity/Legal Involvement Pertinent to Current Situation/Hospitalization:  No - Comment as needed Significant Relationships:  Siblings Lives with:  Self Do you feel safe going back to the place where you live?  Yes Need for family participation in patient care:  No (Coment)  Care giving concerns:  Lives alone    Social Worker assessment / plan:  CSW met with patient this afternoon to discuss his ETOH abuse. Patient was alert and oriented X4 and stated that he knew about DSS APS now being involved because he states his sister called them. Patient reluctantly admitted that he has continued to drink since his last admission and did not seek assistance in stopping (resources were given to patient last admission). Patient states that he was still driving and doing small errands and that he was using a walker at home. Patient confirmed that he and his wife were currently separated and that he was living alone in the home. Patient states he intends to be able to return home at discharge. He states he would be interested in an outpatient rehab program but not an inpatient rehab program. Resources to be given prior to discharge. CSW has updated [name] at [place] via voicemail this afternoon.    Employment status:  Psychologist, counselling:  Managed Care PT Recommendations:  Not assessed at this time Information / Referral to community resources:     Patient/Family's  Response to care:  Patient expressed gratitude for CSW visit.  Patient/Family's Understanding of and Emotional Response to Diagnosis, Current Treatment, and Prognosis:  Patient verbalizes understanding of the need to stop drinking but CSW is not assured that patient is ready to committing to stop at this time.   Emotional Assessment Appearance:  Appears stated age Attitude/Demeanor/Rapport:   (pleasant, cooperative) Affect (typically observed):  Accepting, Appropriate, Other (ashamed) Orientation:  Oriented to Self, Oriented to Place, Oriented to  Time, Oriented to Situation Alcohol / Substance use:  Alcohol Use Psych involvement (Current and /or in the community):  No (Comment)  Discharge Needs  Concerns to be addressed:  Substance Abuse Concerns Readmission within the last 30 days:  No Current discharge risk:  Substance Abuse Barriers to Discharge:  No Barriers Identified   Troy Leff, LCSW 06/08/2015, 4:34 PM

## 2015-06-08 NOTE — Progress Notes (Signed)
GI Inpatient Follow-up Note  Patient Identification: Troy [name] is a 44 y.o. male with ETOH cirrhosis c/b esophageal varices, now admitted with severe c.diff diarrhea.  Subjective:  Stool in rectal tube now mostly maroon. Pt very hungry.  No hematemesis, n/v.  No f/c.   Scheduled Inpatient Medications:  . sodium chloride   Intravenous Once  . allopurinol  300 mg Oral Daily  . antiseptic oral rinse  7 mL Mouth Rinse BID  . folic acid  1 mg Oral Daily  . levothyroxine  150 mcg Oral Daily  . magnesium oxide  400 mg Oral Daily  . metronidazole  500 mg Intravenous Q8H  . pantoprazole (PROTONIX) IV  40 mg Intravenous Q12H  . potassium chloride  40 mEq Oral BID  . potassium phosphate IVPB (mmol)  30 mmol Intravenous Once  . rifaximin  550 mg Oral BID  . saccharomyces boulardii  250 mg Oral BID  . sodium chloride  3 mL Intravenous Q12H  . thiamine  100 mg Oral Daily   Or  . thiamine (VITAMIN B1) IVPB  100 mg Intravenous Daily  . vancomycin  500 mg Oral 4 times per day    Continuous Inpatient Infusions:   . sodium chloride    . 0.9 % NaCl with KCl 20 mEq / L 125 mL/hr at 06/08/15 1037    PRN Inpatient Medications:  acetaminophen **OR** acetaminophen, gabapentin, labetalol, LORazepam **OR** LORazepam, pregabalin, QUEtiapine  Physical Examination: BP 116/73 mmHg  Pulse 110  Temp(Src) 97.9 F (36.6 C) (Oral)  Resp 17  Ht 6' (1.829 m)  Wt 210 lb (95.255 kg)  BMI 28.47 kg/m2  SpO2 100% Gen: NAD, alert and oriented x 2 appears critically ill.  Neck: supple, no JVD or thyromegaly Chest: CTA bilaterally, no wheezes, crackles, or other adventitious sounds CV: RRR, no m/g/c/r Abd: +distended but not tense, nabs, mild diffuse TTP.  Ext: no edema, well perfused with 2+ pulses, Skin: no rash or lesions noted Lymph: no LAD  Data: Lab Results  Component Value Date   WBC 6.4 06/08/2015   HGB 7.1* 06/08/2015   HCT 21.2* 06/08/2015   MCV 102.2* 06/08/2015   PLT 58* 06/08/2015     Recent Labs Lab 06/07/15 0736 06/07/15 1716 06/08/15 0731  HGB 7.6* 7.0* 7.1*   Lab Results  Component Value Date   NA 134* 06/08/2015   K 3.7 06/08/2015   CL 112* 06/08/2015   CO2 16* 06/08/2015   BUN 9 06/08/2015   CREATININE 0.91 06/08/2015   Lab Results  Component Value Date   ALT 11* 06/08/2015   AST 41 06/08/2015   ALKPHOS 201* 06/08/2015   BILITOT 1.2 06/08/2015    Recent Labs Lab 06/07/15 1716  INR 1.33   Assessment/Plan: Mr. Marmolejos is a 44 y.o. male with ETOH cirrhosis c/b varices with ongoing ETOH use,  severe c.diff diarrhea, on PO vanc and IV flagly. Diarrhea went from very dark brown to now mostly maroon,. Hgb has been trending down, was 12 several months ago and now 7.  Suspect the current rectal bleeding is from colonic inflammation from the c.diff.     Recommendations: - cont PO vanc 500 q6 and IV flagyl 500 q8 - 1U prcb now, maintain Hgb > 7 - treat hypocalcemia.  - EGD if large drop in hemoglobin, hemodynamic changes given the hx esoph varices on EGD 04/2014.   - cont IV protonix.   Please call with questions or concerns.  Jasamine Pottinger, Grace Blight, MD

## 2015-06-08 NOTE — Progress Notes (Signed)
[facility] NAME: Loyde Orth    MR#:  [id number]  DATE OF BIRTH:  02/28/1971  SUBJECTIVE:  CHIEF COMPLAINT:   Chief Complaint  Patient presents with  . Diarrhea   Still has significant diarrhea NPO for EGD. Afebrile. No pressors.  REVIEW OF SYSTEMS:    Review of Systems  Constitutional: Positive for malaise/fatigue and diaphoresis. Negative for fever.  HENT: Negative for sore throat.   Eyes: Negative for blurred vision, double vision and pain.  Respiratory: Positive for cough. Negative for hemoptysis, shortness of breath and wheezing.   Cardiovascular: Negative for chest pain, palpitations, orthopnea and leg swelling.  Gastrointestinal: Positive for nausea, abdominal pain and diarrhea. Negative for heartburn, vomiting and constipation.  Genitourinary: Negative for dysuria and hematuria.  Musculoskeletal: Positive for myalgias. Negative for back pain and joint pain.  Skin: Negative for rash.  Neurological: Positive for weakness and headaches. Negative for sensory change, speech change and focal weakness.  Endo/Heme/Allergies: Bruises/bleeds easily.  Psychiatric/Behavioral: Positive for depression. The patient is not nervous/anxious.       DRUG ALLERGIES:  No Known Allergies  VITALS:  Blood pressure 125/80, pulse 105, temperature 97.5 F (36.4 C), temperature source Oral, resp. rate 13, height 6' (1.829 m), weight 95.255 kg (210 lb), SpO2 100 %.  PHYSICAL EXAMINATION:   Physical Exam  GENERAL:  44 y.o.-year-old patient lying in the bed looks critically ill  EYES: Pupils equal, round, reactive to light and accommodation. No scleral icterus. Extraocular muscles intact.  HEENT: Head atraumatic, normocephalic. Oropharynx and nasopharynx clear.  NECK:  Supple, no jugular venous distention. No thyroid enlargement, no tenderness.  LUNGS: Normal breath sounds bilaterally, no wheezing, rales, rhonchi. No use of accessory  muscles of respiration.  CARDIOVASCULAR: S1, S2 normal. No murmurs, rubs, or gallops.  ABDOMEN: Soft, nontender, nondistended. Bowel sounds present. No organomegaly or mass.  EXTREMITIES: No cyanosis, clubbing or edema b/l.    NEUROLOGIC: Cranial nerves II through XII are intact. No focal Motor or sensory deficits b/l.   PSYCHIATRIC: The patient is more awake SKIN: No obvious rash, lesion, or ulcer.    LABORATORY PANEL:   CBC  Recent Labs Lab 06/08/15 0731  WBC 6.4  HGB 7.1*  HCT 21.2*  PLT 58*   ------------------------------------------------------------------------------------------------------------------  Chemistries   Recent Labs Lab 06/08/15 0731 06/08/15 0927  NA 134*  --   K 3.7  --   CL 112*  --   CO2 16*  --   GLUCOSE 93  --   BUN 9  --   CREATININE 0.91  --   CALCIUM 6.2*  --   MG  --  1.6*  AST 41  --   ALT 11*  --   ALKPHOS 201*  --   BILITOT 1.2  --    ------------------------------------------------------------------------------------------------------------------  Cardiac Enzymes No results for input(s): TROPONINI in the last 168 hours. ------------------------------------------------------------------------------------------------------------------  RADIOLOGY:  US Abdomen Limited  06/08/2015   CLINICAL DATA:  Cirrhosis of liver.  EXAM: LIMITED ABDOMEN ULTRASOUND FOR ASCITES  TECHNIQUE: Limited ultrasound survey for ascites was performed in all four abdominal quadrants.  COMPARISON:  May 15, 2015.  FINDINGS: Mild ascites is noted around the liver and the right upper quadrant. Only a minimal to mild amount ascites is noted in the other 3 quadrants of the abdomen.  IMPRESSION: Minimal to mild ascites is noted.   Electronically Signed   By: Marijo Conception, M.D.   On:  06/08/2015 09:04   Dg Chest Port 1 View  06/06/2015   CLINICAL DATA:  Altered mental status.  Diarrhea.  EXAM: PORTABLE CHEST - 1 VIEW  COMPARISON:  05/13/2015  FINDINGS: The heart  size and mediastinal contours are within normal limits. Both lungs are clear. The visualized skeletal structures are unremarkable.  IMPRESSION: Normal exam.   Electronically Signed   By: Lorriane Shire M.D.   On: 06/06/2015 16:38     ASSESSMENT AND PLAN:   69 m with alcohol abuse, seizures, hepatic encephalopathy here with diarrhea  * C diff with septic and hypovolemic shock - Improving On  IV flagyl and PO vancomycin.  IV vancomycin and Zosyn stopped. Avoid other abx. Sever diarrhea.  * Severe hypokalemia - Resolved  * Alcohol abuse Start CIWA  * Acute encephalopathy Due to acute illness Ammonia normal. Lactulose held. On Rifaximin.  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    All the records are reviewed and case discussed with Care Management/Social Workerr. Management plans discussed with the patient, family and they are in agreement.  CODE STATUS: FULL  DVT Prophylaxis: SCDs. No heparin due to blood in stool  TOTAL CRITICAL CARE TIME TAKING CARE OF THIS PATIENT: 35 minutes.    Hillary Bow R M.D on 06/08/2015 at 10:25 AM  Between 7am to 6pm - Pager - [phone]  After 6pm go to [email or web address] - password [email or web address] [facility]  [facility]  Office  [phone]  CC: Primary care physician; No primary care provider on file.

## 2015-06-08 NOTE — Progress Notes (Signed)
[facility] INFECTIOUS DISEASE PROGRESS NOTE Date of Admission:  06/06/2015     ID: Troy [name] is a 44 y.o. male with  C diff Principal Problem:   Diarrhea Active Problems:   Sepsis   Acute renal failure   Hypokalemia   Enteritis due to Clostridium difficile   Anemia of chronic disease   Hyponatremia  Subjective: Still in ICU. No fevers. Diarrhea persists. Getting EGD. Not on pressors  ROS  Eleven systems are reviewed and negative except per hpi  Medications:  . sodium chloride   Intravenous Once  . allopurinol  300 mg Oral Daily  . antiseptic oral rinse  7 mL Mouth Rinse BID  . folic acid  1 mg Oral Daily  . levothyroxine  150 mcg Oral Daily  . metronidazole  500 mg Intravenous Q8H  . pantoprazole (PROTONIX) IV  40 mg Intravenous Q12H  . potassium chloride  40 mEq Oral BID  . potassium phosphate IVPB (mmol)  30 mmol Intravenous Once  . rifaximin  550 mg Oral BID  . saccharomyces boulardii  250 mg Oral BID  . sodium chloride  3 mL Intravenous Q12H  . thiamine  100 mg Oral Daily   Or  . thiamine (VITAMIN B1) IVPB  100 mg Intravenous Daily  . vancomycin  500 mg Oral 4 times per day   Objective: Vital signs in last 24 hours: Temp:  [97.3 F (36.3 C)-97.9 F (36.6 C)] 97.9 F (36.6 C) (06/24 1200) Pulse Rate:  [103-119] 109 (06/24 1200) Resp:  [9-20] 17 (06/24 1200) BP: (95-125)/(62-82) 116/73 mmHg (06/24 1200) SpO2:  [100 %] 100 % (06/24 1200) Constitutional: obese, somewhat confused, chornically ill appearing  HENT:  Mouth/Throat: Oropharynx is clear and dry. No oropharyngeal exudate.  Cardiovascular: tachy  Pulmonary/Chest: Effort normal and breath sounds normal. No respiratory distress. He has no wheezes.  Abdominal: Soft. Distended, mild diffuse ttp Bowel sounds are normal.  Rectal tube and foley in place Lymphadenopathy:  He has no cervical adenopathy.  Neurological: He is awake but groggy and poor historian, + asterisix  Skin: Skin is warm and dry. No  rash noted. No erythema.  Psychiatric: groggy   Lab Results  Recent Labs  06/07/15 0736 06/07/15 1508 06/07/15 1716 06/08/15 0731  WBC 8.9  --  8.3 6.4  HGB 7.6*  --  7.0* 7.1*  HCT 22.9*  --  21.3* 21.2*  NA 133*  --   --  134*  K 2.1* 2.8*  --  3.7  CL 101  --   --  112*  CO2 19*  --   --  16*  BUN 15  --   --  9  CREATININE 0.90  --   --  0.91    Microbiology: Results for orders placed or performed during the hospital encounter of 06/06/15  Stool culture     Status: None (Preliminary result)   Collection Time: 06/06/15  4:14 PM  Result Value Ref Range Status   Specimen Description STOOL  Final   Special Requests Normal  Final   Culture   Final    HOLDING FOR POSSIBLE PATHOGEN No Pathogenic E. coli detected    Report Status PENDING  Incomplete  C difficile quick scan w PCR reflex ([facility] only)     Status: None   Collection Time: 06/06/15  4:20 PM  Result Value Ref Range Status   C Diff antigen POSITIVE  Final   C Diff toxin NEGATIVE  Final   C  Diff interpretation   Final    Positive for toxigenic C. difficile, active toxin production not detected. Patient has toxigenic C. difficile organisms present in the bowel, but toxin was not detected. The patient may be a carrier or the level of toxin in the sample was below the limit  of detection. This information should be used in conjunction with the patient's clinical history when deciding on possible therapy.     Comment: CRITICAL RESULT CALLED TO, READ BACK BY AND VERIFIED WITH: LISA THOMPSON 06/06/15 AT 2033.SDR   Blood Culture (routine x 2)     Status: None (Preliminary result)   Collection Time: 06/06/15  4:20 PM  Result Value Ref Range Status   Specimen Description BLOOD  Final   Special Requests NONE  Final   Culture  Setup Time   Final    GRAM POSITIVE RODS ANAEROBIC BOTTLE ONLY CRITICAL RESULT CALLED TO, READ BACK BY AND VERIFIED WITH: Kathi Simpers AT 6270 06/07/15 DV    Culture   Final    GRAM  POSITIVE RODS ANAEROBIC BOTTLE ONLY IDENTIFICATION TO FOLLOW COAGULASE NEGATIVE STAPHYLOCOCCUS AEROBIC BOTTLE ONLY    Report Status PENDING  Incomplete  Clostridium Difficile by PCR (not at [facility])     Status: Abnormal   Collection Time: 06/06/15  4:20 PM  Result Value Ref Range Status   C difficile by pcr POSITIVE (A) NEGATIVE Final    Comment: Positive for toxigenic C. difficile, active toxin production not detected. Patient has toxigenic C. difficile organisms present in the bowel, but toxin was not detected. The patient may be a carrier or the level of toxin in the sample was below the limit  of detection. This information should be used in conjunction with the patient's clinical history when deciding on possible therapy. CRITICAL RESULT CALLED TO, READ BACK BY AND VERIFIED WITH: LISA THOMPSON 06/06/15 2030. SDW   MRSA PCR Screening     Status: None   Collection Time: 06/06/15  9:00 PM  Result Value Ref Range Status   MRSA by PCR NEGATIVE NEGATIVE Final    Comment:        The GeneXpert MRSA Assay (FDA approved for NASAL specimens only), is one component of a comprehensive MRSA colonization surveillance program. It is not intended to diagnose MRSA infection nor to guide or monitor treatment for MRSA infections.   Urine culture     Status: None (Preliminary result)   Collection Time: 06/07/15  3:41 PM  Result Value Ref Range Status   Specimen Description URINE, CLEAN CATCH  Final   Special Requests NONE  Final   Culture NO GROWTH < 12 HOURS  Final   Report Status PENDING  Incomplete    Studies/Results: US Abdomen Limited  06/08/2015   CLINICAL DATA:  Cirrhosis of liver.  EXAM: LIMITED ABDOMEN ULTRASOUND FOR ASCITES  TECHNIQUE: Limited ultrasound survey for ascites was performed in all four abdominal quadrants.  COMPARISON:  May 15, 2015.  FINDINGS: Mild ascites is noted around the liver and the right upper quadrant. Only a minimal to mild amount ascites is noted in the other  3 quadrants of the abdomen.  IMPRESSION: Minimal to mild ascites is noted.   Electronically Signed   By: Marijo Conception, M.D.   On: 06/08/2015 09:04   Dg Chest Port 1 View  06/06/2015   CLINICAL DATA:  Altered mental status.  Diarrhea.  EXAM: PORTABLE CHEST - 1 VIEW  COMPARISON:  05/13/2015  FINDINGS: The heart size and mediastinal  contours are within normal limits. Both lungs are clear. The visualized skeletal structures are unremarkable.  IMPRESSION: Normal exam.   Electronically Signed   By: Lorriane Shire M.D.   On: 06/06/2015 16:38    Assessment/Plan: EDAHI KROENING is a 44 y.o. male with a known history of recent admission for sepsis, alcohol related seizures, metabolic , hepatic encephalopathy for which he was discharged on lactulose and Xifaxan admitted 6/22 with complaints of ongoing diarrhea and feeling dehydrated and weak. He was noted to be hypotensive with systolic blood pressure in 60s and had marked leukocytpsis.  Of note he was admitted 5/29-6/2 with sepsis and C diff. At that time he was treated with IV abx in ICU - dced on levo and flagyl at that time. He appears to be continuing to drink with + etoh level on admission. Since admit he has been in ICU on fluid resuscitation but not on pressors. BP improving. Diarrhea persists with large volume in rectal tube and now bloody.  WBC improved. Dawson 1/2 + CNS and C diff + I suspect this is all C diff although could have SBP or other ifection.   CNS bacteremia likely contaminant.  Recommendations Continue IV flagyl and oral vanco (there is some data that critically ill patients with C diff do better with this double coverage initially) Hold other abx unless worsens Follow BCX If worsens would repeat bcx, check diagnostic paracentesis to eval for SBP and consider CT abd prior to starting other abx. Thank you very much for the consult. Will follow with you.  [place], Sylvester   06/08/2015, 1:31 PM

## 2015-06-08 NOTE — Progress Notes (Signed)
Pt resting, CIWA scale remains stable.  Rectal tube holding, minimal leaks.  Pt still has large volume of stool output through same.  Generalized pain remains, no SOB.  VSS

## 2015-06-08 NOTE — Progress Notes (Signed)
Exit care information given on EGD

## 2015-06-08 NOTE — Progress Notes (Signed)
[facility] NOTE  Pharmacy Consult for Electrolyte Management  Indication: C.Diff Colitis.    No Known Allergies  Patient Measurements: Height: 6' (182.9 cm) Weight: 210 lb (95.255 kg) IBW/kg (Calculated) : 77.6   Vital Signs: Temp: 97.8 F (36.6 C) (06/24 2000) Temp Source: Oral (06/24 2000) BP: 125/82 mmHg (06/24 2000) Pulse Rate: 125 (06/24 2000) Intake/Output from previous day: 06/23 0701 - 06/24 0700 In: 5973.3 [I.V.:2813.3; IV Piggyback:3160] Out: 5638 [Urine:5150; VFIEP:3295] Intake/Output from this shift: Total I/O In: 325 [Blood:325] Out: -  Vent settings for last 24 hours:    Labs:  Recent Labs  06/06/15 1614 06/06/15 1709  06/07/15 0736 06/07/15 1508 06/07/15 1716 06/08/15 0731 06/08/15 0927  WBC 34.7*  --   < > 8.9  --  8.3 6.4  --   HGB 9.6*  --   < > 7.6*  --  7.0* 7.1*  --   HCT 28.4*  --   < > 22.9*  --  21.3* 21.2*  --   PLT 229  --   < > 95*  --  94* 58*  --   INR  --   --   --   --   --  1.33  --   --   CREATININE 1.51* 1.30*  --  0.90  --   --  0.91  --   MG 1.1*  --   --  1.3* 2.1  --   --  1.6*  PHOS  --   --   --  2.4*  --   --   --  1.5*  ALBUMIN 2.4*  --   --  1.9*  --   --  1.7*  --   PROT 6.2*  --   --  4.7*  --   --  4.4*  --   AST 51*  --   --  47*  --   --  41  --   ALT 13*  --   --  12*  --   --  11*  --   ALKPHOS 234*  --   --  197*  --   --  201*  --   BILITOT 2.2*  --   --  2.0*  --   --  1.2  --   < > = values in this interval not displayed. Estimated Creatinine Clearance: 124.1 mL/min (by C-G formula based on Cr of 0.91).   Recent Labs  06/06/15 1605 06/06/15 2055  GLUCAP 153* 122*    Microbiology: Recent Results (from the past 720 hour(s))  MRSA culture     Status: None   Collection Time: 05/13/15  2:26 PM  Result Value Ref Range Status   Specimen Description NASAL SWAB  Final   Report Status 06/01/2015 FINAL  Final  MRSA PCR Screening     Status: None   Collection Time: 05/13/15  2:26  PM  Result Value Ref Range Status   MRSA by PCR NEGATIVE NEGATIVE Final    Comment:        The GeneXpert MRSA Assay (FDA approved for NASAL specimens only), is one component of a comprehensive MRSA colonization surveillance program. It is not intended to diagnose MRSA infection nor to guide or monitor treatment for MRSA infections.   Blood culture (routine x 2)     Status: None   Collection Time: 05/13/15  4:30 PM  Result Value Ref Range Status   Specimen Description BLOOD  Final  Special Requests BLOOD CULTURE RECEIVED  Final   Culture NO GROWTH 5 DAYS  Final   Report Status 05/18/2015 FINAL  Final  Blood culture (routine x 2)     Status: None   Collection Time: 05/13/15  4:35 PM  Result Value Ref Range Status   Specimen Description BLOOD  Final   Special Requests BLOOD CULTURE RECEIVED  Final   Culture NO GROWTH 5 DAYS  Final   Report Status 05/18/2015 FINAL  Final  C difficile quick scan w PCR reflex [facility])     Status: None   Collection Time: 05/14/15  5:30 PM  Result Value Ref Range Status   C Diff antigen POSITIVE  Final   C Diff toxin NEGATIVE  Final   C Diff interpretation   Final    Positive for toxigenic C. difficile, active toxin production not detected. Patient has toxigenic C. difficile organisms present in the bowel, but toxin was not detected. The patient may be a carrier or the level of toxin in the sample was below the limit  of detection. This information should be used in conjunction with the patient's clinical history when deciding on possible therapy.     Comment: CRITICAL RESULT CALLED TO, READ BACK BY AND VERIFIED WITH: AMBER THOMAS AT 2136 05/14/15 CTJ   Clostridium Difficile by PCR (not at [facility])     Status: Abnormal   Collection Time: 05/14/15  5:30 PM  Result Value Ref Range Status   C difficile by pcr POSITIVE (A) NEGATIVE Corrected    Comment: CORRECTED ON 05/31 AT 1519: PREVIOUSLY REPORTED AS Toxigenic C.diff POSITIVE  Blood Culture (routine x  2)     Status: None (Preliminary result)   Collection Time: 06/06/15  4:14 PM  Result Value Ref Range Status   Specimen Description BLOOD  Final   Special Requests NONE  Final   Culture NO GROWTH 2 DAYS  Final   Report Status PENDING  Incomplete  Stool culture     Status: None (Preliminary result)   Collection Time: 06/06/15  4:14 PM  Result Value Ref Range Status   Specimen Description STOOL  Final   Special Requests Normal  Final   Culture   Final    HOLDING FOR POSSIBLE PATHOGEN No Pathogenic E. coli detected    Report Status PENDING  Incomplete  C difficile quick scan w PCR reflex ([facility] only)     Status: None   Collection Time: 06/06/15  4:20 PM  Result Value Ref Range Status   C Diff antigen POSITIVE  Final   C Diff toxin NEGATIVE  Final   C Diff interpretation   Final    Positive for toxigenic C. difficile, active toxin production not detected. Patient has toxigenic C. difficile organisms present in the bowel, but toxin was not detected. The patient may be a carrier or the level of toxin in the sample was below the limit  of detection. This information should be used in conjunction with the patient's clinical history when deciding on possible therapy.     Comment: CRITICAL RESULT CALLED TO, READ BACK BY AND VERIFIED WITH: LISA THOMPSON 06/06/15 AT 2033.SDR   Blood Culture (routine x 2)     Status: None (Preliminary result)   Collection Time: 06/06/15  4:20 PM  Result Value Ref Range Status   Specimen Description BLOOD  Final   Special Requests NONE  Final   Culture  Setup Time   Final    GRAM POSITIVE RODS ANAEROBIC  BOTTLE ONLY CRITICAL RESULT CALLED TO, READ BACK BY AND VERIFIED WITH: Kathi Simpers AT 7741 06/07/15 DV    Culture   Final    GRAM POSITIVE RODS ANAEROBIC BOTTLE ONLY IDENTIFICATION TO FOLLOW COAGULASE NEGATIVE STAPHYLOCOCCUS AEROBIC BOTTLE ONLY    Report Status PENDING  Incomplete  Clostridium Difficile by PCR (not at [facility])     Status: Abnormal    Collection Time: 06/06/15  4:20 PM  Result Value Ref Range Status   C difficile by pcr POSITIVE (A) NEGATIVE Final    Comment: Positive for toxigenic C. difficile, active toxin production not detected. Patient has toxigenic C. difficile organisms present in the bowel, but toxin was not detected. The patient may be a carrier or the level of toxin in the sample was below the limit  of detection. This information should be used in conjunction with the patient's clinical history when deciding on possible therapy. CRITICAL RESULT CALLED TO, READ BACK BY AND VERIFIED WITH: LISA THOMPSON 06/06/15 2030. SDW   MRSA PCR Screening     Status: None   Collection Time: 06/06/15  9:00 PM  Result Value Ref Range Status   MRSA by PCR NEGATIVE NEGATIVE Final    Comment:        The GeneXpert MRSA Assay (FDA approved for NASAL specimens only), is one component of a comprehensive MRSA colonization surveillance program. It is not intended to diagnose MRSA infection nor to guide or monitor treatment for MRSA infections.   Urine culture     Status: None (Preliminary result)   Collection Time: 06/07/15  3:41 PM  Result Value Ref Range Status   Specimen Description URINE, CLEAN CATCH  Final   Special Requests NONE  Final   Culture NO GROWTH < 12 HOURS  Final   Report Status PENDING  Incomplete    Medications:  Scheduled:  . allopurinol  300 mg Oral Daily  . antiseptic oral rinse  7 mL Mouth Rinse BID  . folic acid  1 mg Oral Daily  . levothyroxine  150 mcg Oral Daily  . magnesium oxide  400 mg Oral Daily  . metronidazole  500 mg Intravenous Q8H  . pantoprazole (PROTONIX) IV  40 mg Intravenous Q12H  . potassium chloride  40 mEq Oral BID  . rifaximin  550 mg Oral BID  . saccharomyces boulardii  250 mg Oral BID  . sodium chloride  3 mL Intravenous Q12H  . thiamine  100 mg Oral Daily   Or  . thiamine (VITAMIN B1) IVPB  100 mg Intravenous Daily  . vancomycin  500 mg Oral 4 times per day    Infusions:  . sodium chloride    . 0.9 % NaCl with KCl 20 mEq / L 125 mL/hr at 06/08/15 1037    Assessment: 44 yo male being treated for C.diff with vancomycin 500mg  PO Q6hr and metronidazole 500mg  IV Q8hr. Patient receiving D5 with 64mEq of Potassium at 152mL/hr.    Plan:   Will replace with potassium phosphate 27mmol IV x1 and magnesium 4g IV x 1. Corrected calcium ~8, will continue to monitor. Will recheck electrolytes with am labs.   Simpson,Michael L 06/08/2015,9:21 PM

## 2015-06-08 NOTE — Plan of Care (Addendum)
Problem: Discharge Progression Outcomes Goal: Other Discharge Outcomes/Goals Outcome: Progressing Plan of care progress: Fluid vol def: -pt continues to have watery stools per rectal tube -remains on isolation for +c-diff -continues ABTs -foley cath in place and draining without difficulty -tolerates full liquid diet -NPO after midnight for EGD -1 unit PRBC ordered for HGB 7.1, infusing, no noted reaction -IV fluids continue -CIWA continues, PRN ativan given for score of 10 -no complaints of pain, no distress or discomfort noted

## 2015-06-09 DIAGNOSIS — E43 Unspecified severe protein-calorie malnutrition: Secondary | ICD-10-CM | POA: Diagnosis present

## 2015-06-09 LAB — BASIC METABOLIC PANEL
Anion gap: 8 (ref 5–15)
BUN: 7 mg/dL (ref 6–20)
CO2: 15 mmol/L — AB (ref 22–32)
Calcium: 6.6 mg/dL — ABNORMAL LOW (ref 8.9–10.3)
Chloride: 112 mmol/L — ABNORMAL HIGH (ref 101–111)
Creatinine, Ser: 0.86 mg/dL (ref 0.61–1.24)
GFR calc Af Amer: >60 mL/min (ref >60)
Glucose, Bld: 92 mg/dL (ref 65–99)
POTASSIUM: 3.6 mmol/L (ref 3.5–5.1)
Sodium: 135 mmol/L (ref 135–145)

## 2015-06-09 LAB — TYPE AND SCREEN
ABO/RH(D): O POS
Antibody Screen: NEGATIVE
Unit division: 0

## 2015-06-09 LAB — URINE CULTURE: CULTURE: NO GROWTH

## 2015-06-09 LAB — CBC
HEMATOCRIT: 23.6 % — AB (ref 40.0–52.0)
HEMOGLOBIN: 7.9 g/dL — AB (ref 13.0–18.0)
MCH: 32.9 pg (ref 26.0–34.0)
MCHC: 33.4 g/dL (ref 32.0–36.0)
MCV: 98.4 fL (ref 80.0–100.0)
PLATELETS: 146 10*3/uL — AB (ref 150–440)
RBC: 2.4 MIL/uL — AB (ref 4.40–5.90)
RDW: 17.7 % — AB (ref 11.5–14.5)
WBC: 8.9 10*3/uL (ref 3.8–10.6)

## 2015-06-09 LAB — MAGNESIUM: Magnesium: 1.6 mg/dL — ABNORMAL LOW (ref 1.7–2.4)

## 2015-06-09 LAB — PHOSPHORUS: Phosphorus: 2.9 mg/dL (ref 2.5–4.6)

## 2015-06-09 LAB — GLUCOSE, CAPILLARY: GLUCOSE-CAPILLARY: 144 mg/dL — AB (ref 65–99)

## 2015-06-09 LAB — VITAMIN D 25 HYDROXY (VIT D DEFICIENCY, FRACTURES): Vit D, 25-Hydroxy: 10.4 ng/mL — ABNORMAL LOW (ref 30.0–100.0)

## 2015-06-09 MED ORDER — MAGNESIUM SULFATE 2 GM/50ML IV SOLN
2.0000 g | Freq: Once | INTRAVENOUS | Status: AC
  Administered 2015-06-09: 2 g via INTRAVENOUS
  Filled 2015-06-09: qty 50

## 2015-06-09 MED ORDER — VITAMIN D (ERGOCALCIFEROL) 1.25 MG (50000 UNIT) PO CAPS
50000.0000 [IU] | ORAL_CAPSULE | ORAL | Status: DC
  Administered 2015-06-09 – 2015-06-16 (×2): 50000 [IU] via ORAL
  Filled 2015-06-09 (×3): qty 1

## 2015-06-09 MED ORDER — METOPROLOL TARTRATE 25 MG PO TABS
25.0000 mg | ORAL_TABLET | Freq: Two times a day (BID) | ORAL | Status: DC
  Administered 2015-06-09 – 2015-06-21 (×24): 25 mg via ORAL
  Filled 2015-06-09 (×26): qty 1

## 2015-06-09 NOTE — Progress Notes (Signed)
[name] NAME: Troy [name]    MR#:  [id number]  DATE OF BIRTH:  [birth year]  SUBJECTIVE:  CHIEF COMPLAINT:   Chief Complaint  Patient presents with  . Diarrhea   Still has significant diarrhea Mild abd pain. Afebrile now. REVIEW OF SYSTEMS:    Review of Systems  Constitutional: Positive for malaise/fatigue and diaphoresis. Negative for fever.  HENT: Negative for sore throat.   Eyes: Negative for blurred vision, double vision and pain.  Respiratory: Positive for cough. Negative for hemoptysis, shortness of breath and wheezing.   Cardiovascular: Negative for chest pain, palpitations, orthopnea and leg swelling.  Gastrointestinal: Positive for nausea, abdominal pain and diarrhea. Negative for heartburn, vomiting and constipation.  Genitourinary: Negative for dysuria and hematuria.  Musculoskeletal: Positive for myalgias. Negative for back pain and joint pain.  Skin: Negative for rash.  Neurological: Positive for weakness and headaches. Negative for sensory change, speech change and focal weakness.  Endo/Heme/Allergies: Bruises/bleeds easily.  Psychiatric/Behavioral: Positive for depression. The patient is not nervous/anxious.       DRUG ALLERGIES:  No Known Allergies  VITALS:  Blood pressure 127/72, pulse 116, temperature 97.7 F (36.5 C), temperature source Oral, resp. rate 20, height 6' (1.829 m), weight 95.255 kg (210 lb), SpO2 100 %.  PHYSICAL EXAMINATION:   Physical Exam  GENERAL:  44 y.o.-year-old patient lying in the bed EYES: Pupils equal, round, reactive to light and accommodation. No scleral icterus. Extraocular muscles intact.  HEENT: Head atraumatic, normocephalic. Oropharynx and nasopharynx clear.  NECK:  Supple, no jugular venous distention. No thyroid enlargement, no tenderness.  LUNGS: Normal breath sounds bilaterally, no wheezing, rales, rhonchi. No use of accessory muscles of respiration.   CARDIOVASCULAR: S1, S2 normal. No murmurs, rubs, or gallops.  ABDOMEN: Soft, nontender, nondistended. Bowel sounds present. No organomegaly or mass.  EXTREMITIES: No cyanosis, clubbing or edema b/l.    NEUROLOGIC: Cranial nerves II through XII are intact. No focal Motor or sensory deficits b/l.   PSYCHIATRIC: The patient is awake and alert. Oriented times 3 SKIN: No obvious rash, lesion, or ulcer.    LABORATORY PANEL:   CBC  Recent Labs Lab 06/09/15 0526  WBC 8.9  HGB 7.9*  HCT 23.6*  PLT 146*   ------------------------------------------------------------------------------------------------------------------  Chemistries   Recent Labs Lab 06/08/15 0731  06/09/15 0526  NA 134*  --  135  K 3.7  --  3.6  CL 112*  --  112*  CO2 16*  --  15*  GLUCOSE 93  --  92  BUN 9  --  7  CREATININE 0.91  --  0.86  CALCIUM 6.2*  --  6.6*  MG  --   < > 1.6*  AST 41  --   --   ALT 11*  --   --   ALKPHOS 201*  --   --   BILITOT 1.2  --   --   < > = values in this interval not displayed. ------------------------------------------------------------------------------------------------------------------  Cardiac Enzymes No results for input(s): TROPONINI in the last 168 hours. ------------------------------------------------------------------------------------------------------------------  RADIOLOGY:  US Abdomen Limited  06/08/2015   CLINICAL DATA:  Cirrhosis of liver.  EXAM: LIMITED ABDOMEN ULTRASOUND FOR ASCITES  TECHNIQUE: Limited ultrasound survey for ascites was performed in all four abdominal quadrants.  COMPARISON:  May 15, 2015.  FINDINGS: Mild ascites is noted around the liver and the right upper quadrant. Only a minimal to mild amount ascites is noted in  the other 3 quadrants of the abdomen.  IMPRESSION: Minimal to mild ascites is noted.   Electronically Signed   By: Marijo Conception, M.D.   On: 06/08/2015 09:04     ASSESSMENT AND PLAN:   11 m with alcohol abuse, seizures,  hepatic encephalopathy here with diarrhea  * C diff with septic and hypovolemic shock - Improving On  IV flagyl and PO vancomycin.  IV vancomycin and Zosyn stopped. Avoid other abx. Severe diarrhea with slow improvement  * Severe hypokalemia - Resolved  * Alcohol abuse Started on CIWA  * Acute encephalopathy Due to acute illness Ammonia normal. Lactulose held. On Rifaximin.  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    All the records are reviewed and case discussed with Care Management/Social Workerr. Management plans discussed with the patient, family and they are in agreement.  CODE STATUS: FULL  DVT Prophylaxis: SCDs. No heparin due to blood in stool  TOTAL TIME TAKING CARE OF THIS PATIENT: 40 minutes.    Hillary Bow R M.D on 06/09/2015 at 12:44 PM  Between 7am to 6pm - Pager - [phone]  After 6pm go to [email or web address] - password [email or web address] [facility]  [facility]  Office  [phone]  CC: Primary care physician; No primary care provider on file.

## 2015-06-09 NOTE — Plan of Care (Signed)
Problem: Discharge Progression Outcomes Goal: Other Discharge Outcomes/Goals Outcome: Progressing Patient is alert and oriented, scoring low on CIWA scale. C/o pain in abdomen, oxycodone given with some relief. NPO after midnight for procedure. Blood finished with no complications. Stool continues to be watery and dark brown. Tachycardic on telemetry.

## 2015-06-09 NOTE — Plan of Care (Signed)
Problem: Discharge Progression Outcomes Goal: Other Discharge Outcomes/Goals Outcome: Progressing Progress to goals: 1. Generalized soreness; no pain meds indicated thus far this shift. 2. VSS. Hgb up to 7.9 Gm with no sign/symptom active bleeding. Upper Endo not ordered at this time. 3. Continued diarrhea with rectal tube in place with leakage at intervals with diaper area skin excoriation.  Linen changes required due to leakage from rectal tube. Continued po vancomycin, IV metronidazole, po K+ and IV and po magnesium supplements given. 4. Pt remains at bedrest with limited motivation to increase activity.

## 2015-06-09 NOTE — Progress Notes (Signed)
GI Inpatient Follow-up Note  Patient Identification: Troy [name] is a 44 y.o. male with severe c.diff induced diarrhea  Subjective: Troy [name] reports his diarrhea is still present.  He has a rectal tube in place and brown liquid stool in noted in the tube and bag.  He denies any nausea or vomiting.  He reports some abdominal pain, generally lower abdomen.  He reports he can feel when an episode of diarrhea is occurring, he gets crampy and gassy and it seems to resolve once the episode begins.   Scheduled Inpatient Medications:  . allopurinol  300 mg Oral Daily  . antiseptic oral rinse  7 mL Mouth Rinse BID  . folic acid  1 mg Oral Daily  . levothyroxine  150 mcg Oral Daily  . metoprolol tartrate  25 mg Oral BID  . metronidazole  500 mg Intravenous Q8H  . pantoprazole (PROTONIX) IV  40 mg Intravenous Q12H  . potassium chloride  40 mEq Oral BID  . rifaximin  550 mg Oral BID  . saccharomyces boulardii  250 mg Oral BID  . sodium chloride  3 mL Intravenous Q12H  . thiamine  100 mg Oral Daily   Or  . thiamine (VITAMIN B1) IVPB  100 mg Intravenous Daily  . vancomycin  500 mg Oral 4 times per day  . Vitamin D (Ergocalciferol)  50,000 Units Oral Q7 days    Continuous Inpatient Infusions:   . sodium chloride    . 0.9 % NaCl with KCl 20 mEq / L 125 mL/hr at 06/09/15 0748    PRN Inpatient Medications:  [DISCONTINUED] acetaminophen **OR** acetaminophen, gabapentin, labetalol, LORazepam **OR** LORazepam, oxyCODONE, pregabalin, QUEtiapine  Review of Systems: Constitutional: Weight is stable.  Eyes: No changes in vision. ENT: No oral lesions, sore throat.  GI: see HPI.  Heme/Lymph: No easy bruising.  CV: No chest pain.  GU: No hematuria.  Integumentary: No rashes. Psych: No depression/anxiety.  Endocrine: No heat/cold intolerance.  Allergic/Immunologic: No urticaria.  Resp: No SOB.  Musculoskeletal: No joint swelling.    Physical Examination: BP 127/72 mmHg  Pulse 91  Temp(Src)  97.7 F (36.5 C) (Oral)  Resp 20  Ht 6' (1.829 m)  Wt 95.255 kg (210 lb)  BMI 28.47 kg/m2  SpO2 100% Gen: NAD, alert and oriented x 4 HEENT: PEERLA, EOMI, Neck: supple, no JVD or thyromegaly Chest: CTA bilaterally, no wheezes, crackles, or other adventitious sounds CV: RRR, no m/g/c/r Abd: soft, NT, ND, +BS in all four quadrants; no HSM, guarding, ridigity, or rebound tenderness Ext: no edema, well perfused with 2+ pulses, Skin: no rash or lesions noted Lymph: no LAD  Data: Lab Results  Component Value Date   WBC 8.9 06/09/2015   HGB 7.9* 06/09/2015   HCT 23.6* 06/09/2015   MCV 98.4 06/09/2015   PLT 146* 06/09/2015    Recent Labs Lab 06/07/15 1716 06/08/15 0731 06/09/15 0526  HGB 7.0* 7.1* 7.9*   Lab Results  Component Value Date   NA 135 06/09/2015   K 3.6 06/09/2015   CL 112* 06/09/2015   CO2 15* 06/09/2015   BUN 7 06/09/2015   CREATININE 0.86 06/09/2015   Lab Results  Component Value Date   ALT 11* 06/08/2015   AST 41 06/08/2015   ALKPHOS 201* 06/08/2015   BILITOT 1.2 06/08/2015    Recent Labs Lab 06/07/15 1716  INR 1.33   Assessment/Plan: Troy [name] is a 44 y.o. male with severe c. Diff induced diarrhea  Recommendations: We will  consider having the rectal tube removed, c. Diff will usually begin to slow down on the 3rd or 4th day. We agree with continuing to watch his Hgb. We will continue to follow with you. Please call with questions or concerns.  Salvadore Farber, PA-C  I personally performed these services.

## 2015-06-09 NOTE — Progress Notes (Addendum)
Dr. Reva Bores notified patient in pain, only tylenol ordered. D/c tylenol, order oxycodone 5 mg q 6 hours PRN.

## 2015-06-10 LAB — CBC
HEMATOCRIT: 26.8 % — AB (ref 40.0–52.0)
HEMOGLOBIN: 8.7 g/dL — AB (ref 13.0–18.0)
MCH: 33.2 pg (ref 26.0–34.0)
MCHC: 32.5 g/dL (ref 32.0–36.0)
MCV: 102 fL — AB (ref 80.0–100.0)
Platelets: 39 10*3/uL — ABNORMAL LOW (ref 150–440)
RBC: 2.63 MIL/uL — ABNORMAL LOW (ref 4.40–5.90)
RDW: 17.3 % — AB (ref 11.5–14.5)
WBC: 9.9 10*3/uL (ref 3.8–10.6)

## 2015-06-10 LAB — STOOL CULTURE: Special Requests: NORMAL

## 2015-06-10 LAB — BASIC METABOLIC PANEL
ANION GAP: 5 (ref 5–15)
BUN: 5 mg/dL — AB (ref 6–20)
CHLORIDE: 116 mmol/L — AB (ref 101–111)
CO2: 17 mmol/L — ABNORMAL LOW (ref 22–32)
Calcium: 7.1 mg/dL — ABNORMAL LOW (ref 8.9–10.3)
Creatinine, Ser: 0.9 mg/dL (ref 0.61–1.24)
GFR calc Af Amer: >60 mL/min (ref >60)
GFR calc non Af Amer: >60 mL/min (ref >60)
Glucose, Bld: 91 mg/dL (ref 65–99)
Potassium: 4.2 mmol/L (ref 3.5–5.1)
Sodium: 138 mmol/L (ref 135–145)

## 2015-06-10 NOTE — Consult Note (Signed)
Pt with C. Diff diarrhea, stool output 300cc last 24 hours.  He is afraid to have the tube removed today that he will have diarrhea.  Will leave it in one more day and let consulting GI doctor decide this matter.  He also has a foley catheter in.  Would likely remove that also.  Will wait til tomorrow on that also at request of patient. VSS afebrile, sat 99%. Recommend alcohol rehab as outpatient.  Would give a 14 day course of vancomycin 250mg  qid for total of 14 days.

## 2015-06-10 NOTE — Plan of Care (Addendum)
Problem: Discharge Progression Outcomes Goal: Other Discharge Outcomes/Goals Outcome: Progressing Progress to goals:  1. Discharge planning in progress. Pt verbalized understanding that he needs rehab at discharge. 2. Reports generalized "sore" to touch with no request for pain medications. Napped at intervals. 3. HGB up to 8.7 GM with no sign/symptom bleeding. Diarrhea continues. Rectal tube found lying in bed with MD orders to leave out. Skin in posterior diaper area reddened/excoriated with skin care, barrier use done. Pt educated to lie on sides to allow air and avoid stool contact as much as possible. VSS. Telemetry discontined. K+normal limits now. Continued vancomycin po and metronidazole IV.  4. Pt has limited motivation to increase activity or participate in self care. Asks for assistance for TV remote instead of reaching for it. Remains at bedrest; declined increase in activity. Very resistive to efforts to keep turned/perform pericare as needed.  Pt does not notify staff when is incontinent.

## 2015-06-10 NOTE — Progress Notes (Signed)
[facility] NAME: Troy [name]    MR#:  [id number]  DATE OF BIRTH:  01-28-1971  SUBJECTIVE:  CHIEF COMPLAINT:   Chief Complaint  Patient presents with  . Diarrhea   Still has significant diarrhea Mild abd pain. Afebrile. Feels weak. REVIEW OF SYSTEMS:    Review of Systems  Constitutional: Positive for malaise/fatigue and diaphoresis. Negative for fever.  HENT: Negative for sore throat.   Eyes: Negative for blurred vision, double vision and pain.  Respiratory: Positive for cough. Negative for hemoptysis, shortness of breath and wheezing.   Cardiovascular: Negative for chest pain, palpitations, orthopnea and leg swelling.  Gastrointestinal: Positive for nausea, abdominal pain and diarrhea. Negative for heartburn, vomiting and constipation.  Genitourinary: Negative for dysuria and hematuria.  Musculoskeletal: Positive for myalgias. Negative for back pain and joint pain.  Skin: Negative for rash.  Neurological: Positive for weakness and headaches. Negative for sensory change, speech change and focal weakness.  Endo/Heme/Allergies: Bruises/bleeds easily.  Psychiatric/Behavioral: Positive for depression. The patient is not nervous/anxious.       DRUG ALLERGIES:  No Known Allergies  VITALS:  Blood pressure 110/73, pulse 99, temperature 98 F (36.7 C), temperature source Oral, resp. rate 18, height 6' (1.829 m), weight 95.255 kg (210 lb), SpO2 99 %.  PHYSICAL EXAMINATION:   Physical Exam  GENERAL:  44 y.o.-year-old patient lying in the bed EYES: Pupils equal, round, reactive to light and accommodation. No scleral icterus. Extraocular muscles intact.  HEENT: Head atraumatic, normocephalic. Oropharynx and nasopharynx clear.  NECK:  Supple, no jugular venous distention. No thyroid enlargement, no tenderness.  LUNGS: Normal breath sounds bilaterally, no wheezing, rales, rhonchi. No use of accessory muscles of respiration.   CARDIOVASCULAR: S1, S2 normal. No murmurs, rubs, or gallops.  ABDOMEN: Soft, tender, nondistended. Bowel sounds present. No organomegaly or mass.  EXTREMITIES: No cyanosis, clubbing. 1+ edema bilateral LE  NEUROLOGIC: Cranial nerves II through XII are intact. No focal Motor or sensory deficits b/l.   PSYCHIATRIC: The patient is awake and alert. Oriented times 3 SKIN: No obvious rash, lesion, or ulcer.    LABORATORY PANEL:   CBC  Recent Labs Lab 06/10/15 0816  WBC 9.9  HGB 8.7*  HCT 26.8*  PLT PENDING   ------------------------------------------------------------------------------------------------------------------  Chemistries   Recent Labs Lab 06/08/15 0731  06/09/15 0526 06/10/15 0816  NA 134*  --  135 138  K 3.7  --  3.6 4.2  CL 112*  --  112* 116*  CO2 16*  --  15* 17*  GLUCOSE 93  --  92 91  BUN 9  --  7 5*  CREATININE 0.91  --  0.86 0.90  CALCIUM 6.2*  --  6.6* 7.1*  MG  --   < > 1.6*  --   AST 41  --   --   --   ALT 11*  --   --   --   ALKPHOS 201*  --   --   --   BILITOT 1.2  --   --   --   < > = values in this interval not displayed. ------------------------------------------------------------------------------------------------------------------  Cardiac Enzymes No results for input(s): TROPONINI in the last 168 hours. ------------------------------------------------------------------------------------------------------------------  RADIOLOGY:  No results found.   ASSESSMENT AND PLAN:   44 m with alcohol abuse, seizures, hepatic encephalopathy here with diarrhea  * C diff On  IV flagyl and PO vancomycin.  IV vancomycin and Zosyn stopped. Avoid  other abx. Severe diarrhea with slow improvement  * septic and hypovolemic shock - Resolved  * Severe hypokalemia - Resolved  * Alcohol abuse ON CIWA  * Acute encephalopathy Due to acute illness Ammonia normal. Lactulose held. On Rifaximin.  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    All the records are reviewed and case discussed with Care Management/Social Workerr. Management plans discussed with the patient, family and they are in agreement.  CODE STATUS: FULL  DVT Prophylaxis: SCDs. No heparin due to blood in stool  TOTAL TIME TAKING CARE OF THIS PATIENT: 35 minutes.    Hillary Bow R M.D on 06/10/2015 at 9:40 AM  Between 7am to 6pm - Pager - [phone]  After 6pm go to [email or web address] - password [email or web address] [facility]  [facility]  Office  [phone]  CC: Primary care physician; No primary care provider on file.

## 2015-06-10 NOTE — Progress Notes (Signed)
[facility] INFECTIOUS DISEASE PROGRESS NOTE Date of Admission:  06/06/2015     ID: Troy [name] is a 44 y.o. male with  Cirrhosis, C diff.  Active Problems:   Sepsis   Acute renal failure   Hypokalemia   Enteritis due to Clostridium difficile   Anemia of chronic disease   Hyponatremia   Hypocalcemia   Protein-calorie malnutrition, severe   Subjective:Out of unit, still confused and with large amt diarrhea  ROS  Eleven systems are reviewed and negative except per hpi  Medications:  Antibiotics Given (last 72 hours)    Date/Time Action Medication Dose Rate   06/07/15 1813 Given   vancomycin (VANCOCIN) 50 mg/mL oral solution 500 mg 500 mg    06/07/15 1814 Given   metroNIDAZOLE (FLAGYL) IVPB 500 mg 500 mg 100 mL/hr   06/07/15 2058 Given   rifaximin (XIFAXAN) tablet 550 mg 550 mg    06/08/15 0019 Given   vancomycin (VANCOCIN) 50 mg/mL oral solution 500 mg 500 mg    06/08/15 0020 Given   metroNIDAZOLE (FLAGYL) IVPB 500 mg 500 mg 100 mL/hr   06/08/15 0552 Given   vancomycin (VANCOCIN) 50 mg/mL oral solution 500 mg 500 mg    06/08/15 1036 Given   rifaximin (XIFAXAN) tablet 550 mg 550 mg    06/08/15 1038 Given   metroNIDAZOLE (FLAGYL) IVPB 500 mg 500 mg 100 mL/hr   06/08/15 1322 Given   vancomycin (VANCOCIN) 50 mg/mL oral solution 500 mg 500 mg    06/08/15 1802 Given   metroNIDAZOLE (FLAGYL) IVPB 500 mg 500 mg 100 mL/hr   06/08/15 1802 Given   vancomycin (VANCOCIN) 50 mg/mL oral solution 500 mg 500 mg    06/08/15 2144 Given   rifaximin (XIFAXAN) tablet 550 mg 550 mg    06/09/15 0025 Given   vancomycin (VANCOCIN) 50 mg/mL oral solution 500 mg 500 mg    06/09/15 0404 Given  [medication not available at scheduled time]   metroNIDAZOLE (FLAGYL) IVPB 500 mg 500 mg 100 mL/hr   06/09/15 1041 Given   rifaximin (XIFAXAN) tablet 550 mg 550 mg    06/09/15 1147 Given   metroNIDAZOLE (FLAGYL) IVPB 500 mg 500 mg 100 mL/hr   06/09/15 1149 Given   vancomycin (VANCOCIN) 50 mg/mL  oral solution 500 mg 500 mg    06/09/15 1749 Given   metroNIDAZOLE (FLAGYL) IVPB 500 mg 500 mg 100 mL/hr   06/09/15 1749 Given   vancomycin (VANCOCIN) 50 mg/mL oral solution 500 mg 500 mg    06/09/15 2202 Given   rifaximin (XIFAXAN) tablet 550 mg 550 mg    06/09/15 2347 Given   vancomycin (VANCOCIN) 50 mg/mL oral solution 500 mg 500 mg    06/10/15 0433 Given   metroNIDAZOLE (FLAGYL) IVPB 500 mg 500 mg 100 mL/hr   06/10/15 0556 Given   vancomycin (VANCOCIN) 50 mg/mL oral solution 500 mg 500 mg    06/10/15 0836 Given   rifaximin (XIFAXAN) tablet 550 mg 550 mg    06/10/15 1209 Given   metroNIDAZOLE (FLAGYL) IVPB 500 mg 500 mg 100 mL/hr   06/10/15 1209 Given   vancomycin (VANCOCIN) 50 mg/mL oral solution 500 mg 500 mg      . allopurinol  300 mg Oral Daily  . antiseptic oral rinse  7 mL Mouth Rinse BID  . folic acid  1 mg Oral Daily  . levothyroxine  150 mcg Oral Daily  . metoprolol tartrate  25 mg Oral BID  . metronidazole  500  mg Intravenous Q8H  . pantoprazole (PROTONIX) IV  40 mg Intravenous Q12H  . potassium chloride  40 mEq Oral BID  . rifaximin  550 mg Oral BID  . saccharomyces boulardii  250 mg Oral BID  . sodium chloride  3 mL Intravenous Q12H  . thiamine  100 mg Oral Daily   Or  . thiamine (VITAMIN B1) IVPB  100 mg Intravenous Daily  . vancomycin  500 mg Oral 4 times per day  . Vitamin D (Ergocalciferol)  50,000 Units Oral Q7 days    Objective: Vital signs in last 24 hours: Temp:  [97.8 F (36.6 C)-98.2 F (36.8 C)] 97.8 F (36.6 C) (06/26 1243) Pulse Rate:  [93-103] 93 (06/26 1243) Resp:  [18-20] 20 (06/26 1243) BP: (93-122)/(51-73) 94/51 mmHg (06/26 1243) SpO2:  [80 %-100 %] 80 % (06/26 1243) Constitutional: obese, somewhat confused, chornically ill appearing  HENT:  Mouth/Throat: Oropharynx is clear and dry. No oropharyngeal exudate.  Cardiovascular: tachy  Pulmonary/Chest: Effort normal and breath sounds normal. No respiratory distress. He has no  wheezes.  Abdominal: Soft. Distended, mild diffuse ttp Bowel sounds are normal.  Rectal tube and foley in place Lymphadenopathy:  He has no cervical adenopathy.  Neurological: He is awake but groggy and poor historian, + asterisix  Skin: Skin is warm and dry. No rash noted. No erythema.  Psychiatric: groggy  Lab Results  Recent Labs  06/09/15 0526 06/10/15 0816  WBC 8.9 9.9  HGB 7.9* 8.7*  HCT 23.6* 26.8*  NA 135 138  K 3.6 4.2  CL 112* 116*  CO2 15* 17*  BUN 7 5*  CREATININE 0.86 0.90    Microbiology: Results for orders placed or performed during the hospital encounter of 06/06/15  Blood Culture (routine x 2)     Status: None (Preliminary result)   Collection Time: 06/06/15  4:14 PM  Result Value Ref Range Status   Specimen Description BLOOD  Final   Special Requests NONE  Final   Culture NO GROWTH 4 DAYS  Final   Report Status PENDING  Incomplete  Stool culture     Status: None   Collection Time: 06/06/15  4:14 PM  Result Value Ref Range Status   Specimen Description STOOL  Final   Special Requests Normal  Final   Culture   Final    NO SALMONELLA OR SHIGELLA ISOLATED No Pathogenic E. coli detected    Report Status 06/10/2015 FINAL  Final  C difficile quick scan w PCR reflex ([facility] only)     Status: None   Collection Time: 06/06/15  4:20 PM  Result Value Ref Range Status   C Diff antigen POSITIVE  Final   C Diff toxin NEGATIVE  Final   C Diff interpretation   Final    Positive for toxigenic C. difficile, active toxin production not detected. Patient has toxigenic C. difficile organisms present in the bowel, but toxin was not detected. The patient may be a carrier or the level of toxin in the sample was below the limit  of detection. This information should be used in conjunction with the patient's clinical history when deciding on possible therapy.     Comment: CRITICAL RESULT CALLED TO, READ BACK BY AND VERIFIED WITH: LISA THOMPSON 06/06/15 AT 2033.SDR    Blood Culture (routine x 2)     Status: None (Preliminary result)   Collection Time: 06/06/15  4:20 PM  Result Value Ref Range Status   Specimen Description BLOOD  Final  Special Requests NONE  Final   Culture  Setup Time   Final    GRAM POSITIVE RODS ANAEROBIC BOTTLE ONLY CRITICAL RESULT CALLED TO, READ BACK BY AND VERIFIED WITH: Kathi Simpers AT 4158 06/07/15 DV    Culture   Final    GRAM POSITIVE RODS ANAEROBIC BOTTLE ONLY IDENTIFICATION TO FOLLOW COAGULASE NEGATIVE STAPHYLOCOCCUS AEROBIC BOTTLE ONLY    Report Status PENDING  Incomplete  Clostridium Difficile by PCR (not at [facility])     Status: Abnormal   Collection Time: 06/06/15  4:20 PM  Result Value Ref Range Status   C difficile by pcr POSITIVE (A) NEGATIVE Final    Comment: Positive for toxigenic C. difficile, active toxin production not detected. Patient has toxigenic C. difficile organisms present in the bowel, but toxin was not detected. The patient may be a carrier or the level of toxin in the sample was below the limit  of detection. This information should be used in conjunction with the patient's clinical history when deciding on possible therapy. CRITICAL RESULT CALLED TO, READ BACK BY AND VERIFIED WITH: LISA THOMPSON 06/06/15 2030. SDW   MRSA PCR Screening     Status: None   Collection Time: 06/06/15  9:00 PM  Result Value Ref Range Status   MRSA by PCR NEGATIVE NEGATIVE Final    Comment:        The GeneXpert MRSA Assay (FDA approved for NASAL specimens only), is one component of a comprehensive MRSA colonization surveillance program. It is not intended to diagnose MRSA infection nor to guide or monitor treatment for MRSA infections.   Urine culture     Status: None   Collection Time: 06/07/15  3:41 PM  Result Value Ref Range Status   Specimen Description URINE, CLEAN CATCH  Final   Special Requests NONE  Final   Culture NO GROWTH 2 DAYS  Final   Report Status 06/09/2015 FINAL  Final     Studies/Results: No results found.  Assessment/Plan: Troy [name] is a 44 y.o. male with a known history of recent admission for sepsis, alcohol related seizures, metabolic , hepatic encephalopathy for which he was discharged on lactulose and Xifaxan admitted 6/22 with complaints of ongoing diarrhea and feeling dehydrated and weak. He was noted to be hypotensive with systolic blood pressure in 60s and had marked leukocytpsis.  Of note he was admitted 5/29-6/2 with sepsis and C diff. At that time he was treated with IV abx in ICU - dced on levo and flagyl at that time. He appears to be continuing to drink with + etoh level on admission. Since admit he has been in ICU on fluid resuscitation but not on pressors. BP improving. Diarrhea persists with large volume in rectal tube and now bloody. WBC improved. [place] 1/2 + CNS and C diff + I suspect this is all C diff although could have SBP or other ifection.  CNS bacteremia likely contaminant. No further fevers, wbc down to 9 Recommendations Continue IV flagyl and oral vanco (there is some data that critically ill patients with C diff do better with this double coverage initially) Hold other abx unless worsens Follow BCX If worsens would repeat bcx, check diagnostic paracentesis to eval for SBP and consider CT abd prior to starting other abx. Thank you very much for the consult. Will follow with you.   [place], [place]   06/10/2015, 1:41 PM

## 2015-06-10 NOTE — Progress Notes (Signed)
Rectal tube founding lying in the bed with stool leakage still round tube prior to displacement. Night shift nurse reported rectal tube came out and was replaced. TC to Dr Vira Agar with telephone call order to leave rectal tube out and repeat back done.

## 2015-06-10 NOTE — Plan of Care (Signed)
Problem: Discharge Progression Outcomes Goal: Other Discharge Outcomes/Goals Outcome: Progressing Patient is alert and oriented, c/o pain in abdomen. Oxycodone given x1 with improvement. Scoring low on CIWA. Antibiotics continued, diarrhea continues with rectal tube, leakage at times. Urinary catheter remains in place. Skin is excoriated on bottom and groin area.

## 2015-06-11 LAB — CBC
HCT: 24.8 % — ABNORMAL LOW (ref 40.0–52.0)
HEMOGLOBIN: 7.7 g/dL — AB (ref 13.0–18.0)
MCH: 32 pg (ref 26.0–34.0)
MCHC: 30.9 g/dL — AB (ref 32.0–36.0)
MCV: 103.7 fL — ABNORMAL HIGH (ref 80.0–100.0)
RBC: 2.4 MIL/uL — ABNORMAL LOW (ref 4.40–5.90)
RDW: 18.5 % — AB (ref 11.5–14.5)
WBC: 10.6 10*3/uL (ref 3.8–10.6)

## 2015-06-11 LAB — CULTURE, BLOOD (ROUTINE X 2): CULTURE: NO GROWTH

## 2015-06-11 NOTE — Plan of Care (Signed)
Problem: Discharge Progression Outcomes Goal: Other Discharge Outcomes/Goals Outcome: Progressing Plan of Care Progress to Goal:  Pt not having any CIWA symptoms.  Foley removed approx 15:30.   Continues to have incontinent bouts of  brown watery stools.  No blood in stool.  But pt's Hgb has dropped to 7.7.  On oral vanc and IV flagyl. If Hgb continues to drop, may have upper EGD.  Pt also had recent admission 5.29-6/2.

## 2015-06-11 NOTE — Progress Notes (Signed)
[facility] INFECTI[facility] PROGRESS NOTE Date of Admission:  06/06/2015     ID: Troy [name] is a 44 y.o. male with  Cirrhosis, C diff.  Active Problems:   Sepsis   Acute renal failure   Hypokalemia   Enteritis due to Clostridium difficile   Anemia of chronic disease   Hyponatremia   Hypocalcemia   Protein-calorie malnutrition, severe   Subjective: Less confused, He says having 6 BM an hour and that he is incontinent of stool now. RN reports that stools are several times a day.  Rectal tube came out over weekend with balloon inflated. He has lots of skin breakdown from the diarrhea. Currently lying in liquid stool but does not seem to be aware of it.   ROS  Eleven systems are reviewed and negative except per hpi  Medications:  Antibiotics Given (last 72 hours)    Date/Time Action Medication Dose Rate   06/08/15 1802 Given   metroNIDAZOLE (FLAGYL) IVPB 500 mg 500 mg 100 mL/hr   06/08/15 1802 Given   vancomycin (VANCOCIN) 50 mg/mL oral solution 500 mg 500 mg    06/08/15 2144 Given   rifaximin (XIFAXAN) tablet 550 mg 550 mg    06/09/15 0025 Given   vancomycin (VANCOCIN) 50 mg/mL oral solution 500 mg 500 mg    06/09/15 0404 Given  [medication not available at scheduled time]   metroNIDAZOLE (FLAGYL) IVPB 500 mg 500 mg 100 mL/hr   06/09/15 1041 Given   rifaximin (XIFAXAN) tablet 550 mg 550 mg    06/09/15 1147 Given   metroNIDAZOLE (FLAGYL) IVPB 500 mg 500 mg 100 mL/hr   06/09/15 1149 Given   vancomycin (VANCOCIN) 50 mg/mL oral solution 500 mg 500 mg    06/09/15 1749 Given   metroNIDAZOLE (FLAGYL) IVPB 500 mg 500 mg 100 mL/hr   06/09/15 1749 Given   vancomycin (VANCOCIN) 50 mg/mL oral solution 500 mg 500 mg    06/09/15 2202 Given   rifaximin (XIFAXAN) tablet 550 mg 550 mg    06/09/15 2347 Given   vancomycin (VANCOCIN) 50 mg/mL oral solution 500 mg 500 mg    06/10/15 0433 Given   metroNIDAZOLE (FLAGYL) IVPB 500 mg 500 mg 100 mL/hr   06/10/15 0556 Given    vancomycin (VANCOCIN) 50 mg/mL oral solution 500 mg 500 mg    06/10/15 0836 Given   rifaximin (XIFAXAN) tablet 550 mg 550 mg    06/10/15 1209 Given   metroNIDAZOLE (FLAGYL) IVPB 500 mg 500 mg 100 mL/hr   06/10/15 1209 Given   vancomycin (VANCOCIN) 50 mg/mL oral solution 500 mg 500 mg    06/10/15 1721 Given   vancomycin (VANCOCIN) 50 mg/mL oral solution 500 mg 500 mg    06/10/15 2012 Given   rifaximin (XIFAXAN) tablet 550 mg 550 mg    06/10/15 2012 Given   metroNIDAZOLE (FLAGYL) IVPB 500 mg 500 mg 100 mL/hr   06/10/15 2333 Given   vancomycin (VANCOCIN) 50 mg/mL oral solution 500 mg 500 mg    06/11/15 0501 Given   metroNIDAZOLE (FLAGYL) IVPB 500 mg 500 mg 100 mL/hr   06/11/15 0502 Given   vancomycin (VANCOCIN) 50 mg/mL oral solution 500 mg 500 mg    06/11/15 0947 Given   rifaximin (XIFAXAN) tablet 550 mg 550 mg    06/11/15 1239 Given   metroNIDAZOLE (FLAGYL) IVPB 500 mg 500 mg 100 mL/hr   06/11/15 1239 Given   vancomycin (VANCOCIN) 50 mg/mL oral solution 500 mg 500 mg      .  allopurinol  300 mg Oral Daily  . antiseptic oral rinse  7 mL Mouth Rinse BID  . folic acid  1 mg Oral Daily  . levothyroxine  150 mcg Oral Daily  . metoprolol tartrate  25 mg Oral BID  . metronidazole  500 mg Intravenous Q8H  . pantoprazole (PROTONIX) IV  40 mg Intravenous Q12H  . potassium chloride  40 mEq Oral BID  . rifaximin  550 mg Oral BID  . saccharomyces boulardii  250 mg Oral BID  . sodium chloride  3 mL Intravenous Q12H  . thiamine  100 mg Oral Daily   Or  . thiamine (VITAMIN B1) IVPB  100 mg Intravenous Daily  . vancomycin  500 mg Oral 4 times per day  . Vitamin D (Ergocalciferol)  50,000 Units Oral Q7 days    Objective: Vital signs in last 24 hours: Temp:  [97.7 F (36.5 C)-97.8 F (36.6 C)] 97.7 F (36.5 C) (06/27 0522) Pulse Rate:  [93-103] 103 (06/27 0944) Resp:  [18-20] 18 (06/27 0522) BP: (97-114)/(60-67) 109/67 mmHg (06/27 0944) SpO2:  [99 %-100 %] 99 % (06/27  0522) Constitutional: obese, somewhat confused, chornically ill appearing  HENT:  Mouth/Throat: Oropharynx is clear and dry. No oropharyngeal exudate.  Cardiovascular: tachy  Pulmonary/Chest: Effort normal and breath sounds normal. No respiratory distress. He has no wheezes.  Abdominal: Soft. Distended, mild diffuse ttp Bowel sounds are normal. Rectal with large ext hemorrhoids, poor rectal tone, he is lying in stool lots of excorciations  foley in place Lymphadenopathy:  He has no cervical adenopathy.  Neurological: He is awake but groggy and poor historian, + asterisix  Skin: Skin is warm and dry. No rash noted. No erythema.  Psychiatric: groggy  Lab Results  Recent Labs  06/09/15 0526 06/10/15 0816  WBC 8.9 9.9  HGB 7.9* 8.7*  HCT 23.6* 26.8*  NA 135 138  K 3.6 4.2  CL 112* 116*  CO2 15* 17*  BUN 7 5*  CREATININE 0.86 0.90   Microbiology: Results for orders placed or performed during the hospital encounter of 06/06/15  Blood Culture (routine x 2)     Status: None   Collection Time: 06/06/15  4:14 PM  Result Value Ref Range Status   Specimen Description BLOOD  Final   Special Requests NONE  Final   Culture NO GROWTH 5 DAYS  Final   Report Status 06/11/2015 FINAL  Final  Stool culture     Status: None   Collection Time: 06/06/15  4:14 PM  Result Value Ref Range Status   Specimen Description STOOL  Final   Special Requests Normal  Final   Culture   Final    NO SALMONELLA OR SHIGELLA ISOLATED No Pathogenic E. coli detected    Report Status 06/10/2015 FINAL  Final  C difficile quick scan w PCR reflex ([facility] only)     Status: None   Collection Time: 06/06/15  4:20 PM  Result Value Ref Range Status   C Diff antigen POSITIVE  Final   C Diff toxin NEGATIVE  Final   C Diff interpretation   Final    Positive for toxigenic C. difficile, active toxin production not detected. Patient has toxigenic C. difficile organisms present in the bowel, but toxin was not  detected. The patient may be a carrier or the level of toxin in the sample was below the limit  of detection. This information should be used in conjunction with the patient's clinical history when deciding on possible  therapy.     Comment: CRITICAL RESULT CALLED TO, READ BACK BY AND VERIFIED WITH: LISA THOMPSON 06/06/15 AT 2033.SDR   Blood Culture (routine x 2)     Status: None (Preliminary result)   Collection Time: 06/06/15  4:20 PM  Result Value Ref Range Status   Specimen Description BLOOD  Final   Special Requests NONE  Final   Culture  Setup Time   Final    GRAM POSITIVE RODS ANAEROBIC BOTTLE ONLY CRITICAL RESULT CALLED TO, READ BACK BY AND VERIFIED WITH: Kathi Simpers AT 9678 06/07/15 DV    Culture   Final    GRAM POSITIVE RODS ANAEROBIC BOTTLE ONLY IDENTIFICATION TO FOLLOW COAGULASE NEGATIVE STAPHYLOCOCCUS AEROBIC BOTTLE ONLY    Report Status PENDING  Incomplete  Clostridium Difficile by PCR (not at [facility])     Status: Abnormal   Collection Time: 06/06/15  4:20 PM  Result Value Ref Range Status   C difficile by pcr POSITIVE (A) NEGATIVE Final    Comment: Positive for toxigenic C. difficile, active toxin production not detected. Patient has toxigenic C. difficile organisms present in the bowel, but toxin was not detected. The patient may be a carrier or the level of toxin in the sample was below the limit  of detection. This information should be used in conjunction with the patient's clinical history when deciding on possible therapy. CRITICAL RESULT CALLED TO, READ BACK BY AND VERIFIED WITH: LISA THOMPSON 06/06/15 2030. SDW   MRSA PCR Screening     Status: None   Collection Time: 06/06/15  9:00 PM  Result Value Ref Range Status   MRSA by PCR NEGATIVE NEGATIVE Final    Comment:        The GeneXpert MRSA Assay (FDA approved for NASAL specimens only), is one component of a comprehensive MRSA colonization surveillance program. It is not intended to diagnose  MRSA infection nor to guide or monitor treatment for MRSA infections.   Urine culture     Status: None   Collection Time: 06/07/15  3:41 PM  Result Value Ref Range Status   Specimen Description URINE, CLEAN CATCH  Final   Special Requests NONE  Final   Culture NO GROWTH 2 DAYS  Final   Report Status 06/09/2015 FINAL  Final    Studies/Results: No results found.  Assessment/Plan: Troy [name] is a 44 y.o. male with a known history of recent admission for sepsis, alcohol related seizures, metabolic , hepatic encephalopathy for which he was discharged on lactulose and Xifaxan admitted 6/22 with complaints of ongoing diarrhea and feeling dehydrated and weak. He was noted to be hypotensive with systolic blood pressure in 60s and had marked leukocytpsis.  Of note he was admitted 5/29-6/2 with sepsis and C diff. At that time he was treated with IV abx in ICU - dced on levo and flagyl at that time. He appears to be continuing to drink with + etoh level on admission. Since admit he has been in ICU on fluid resuscitation but not on pressors. BP improving. Diarrhea persists with large volume in rectal tube and now bloody. WBC improved. [place] 1/2 + CNS and C diff + I suspect this is all C diff although could have SBP or other ifection.  CNS bacteremia likely contaminant. No further fevers, wbc down to 9 but diarrhea persists and lots of excoriated skin.  Recommendations Continue IV flagyl and oral vanco (there is some data that critically ill patients with C diff do better with this double  coverage initially) Hold other abx unless worsens Would ask GI regarding whether he has loss of rectal tone from pulling out the rectal tube. Would ask if any options for the diarrhea as well. If worsens would repeat bcx, check diagnostic paracentesis to eval for SBP and consider CT abd prior to starting other abx. Thank you very much for the consult. Will follow with you.   [place],  [place]   06/11/2015, 1:51 PM

## 2015-06-11 NOTE — Plan of Care (Signed)
Problem: Discharge Progression Outcomes Goal: Other Discharge Outcomes/Goals Plan of care progress to goal: Pain: no complaints of pain Hemodynamically: VSS this shift Diarrhea: decreased episodes this shift Activity: pt reluctant to move around in bed

## 2015-06-11 NOTE — Progress Notes (Signed)
[facility] [place] NAME: Troy [name]    MR#:  [id number]  DATE OF BIRTH:  [birth year]  SUBJECTIVE:  CHIEF COMPLAINT:   Chief Complaint  Patient presents with  . Diarrhea   Still has significant diarrhea Mild abd pain. Afebrile. Feels weak.  REVIEW OF SYSTEMS:    Review of Systems  Constitutional: Positive for malaise/fatigue and diaphoresis. Negative for fever.  HENT: Negative for sore throat.   Eyes: Negative for blurred vision, double vision and pain.  Respiratory: Positive for cough. Negative for hemoptysis, shortness of breath and wheezing.   Cardiovascular: Negative for chest pain, palpitations, orthopnea and leg swelling.  Gastrointestinal: Positive for nausea, abdominal pain and diarrhea. Negative for heartburn, vomiting and constipation.  Genitourinary: Negative for dysuria and hematuria.  Musculoskeletal: Positive for myalgias. Negative for back pain and joint pain.  Skin: Negative for rash.  Neurological: Positive for weakness and headaches. Negative for sensory change, speech change and focal weakness.  Endo/Heme/Allergies: Bruises/bleeds easily.  Psychiatric/Behavioral: Positive for depression. The patient is not nervous/anxious.     DRUG ALLERGIES:  No Known Allergies  VITALS:  Blood pressure 104/55, pulse 91, temperature 97.9 F (36.6 C), temperature source Oral, resp. rate 20, height 6' (1.829 m), weight 95.255 kg (210 lb), SpO2 100 %.  PHYSICAL EXAMINATION:   Physical Exam  GENERAL:  43 y.o.-year-old patient lying in the bed EYES: Pupils equal, round, reactive to light and accommodation. No scleral icterus. Extraocular muscles intact.  HEENT: Head atraumatic, normocephalic. Oropharynx and nasopharynx clear.  NECK:  Supple, no jugular venous distention. No thyroid enlargement, no tenderness.  LUNGS: Normal breath sounds bilaterally, no wheezing, rales, rhonchi. No use of accessory muscles of respiration.   CARDIOVASCULAR: S1, S2 normal. No murmurs, rubs, or gallops.  ABDOMEN: Soft, tender, nondistended. Bowel sounds present. No organomegaly or mass.  EXTREMITIES: No cyanosis, clubbing. 1+ edema bilateral LE  NEUROLOGIC: Cranial nerves II through XII are intact. No focal Motor or sensory deficits b/l.   PSYCHIATRIC: The patient is awake and alert. Oriented times 3 SKIN: No obvious rash, lesion, or ulcer.   LABORATORY PANEL:   CBC  Recent Labs Lab 06/11/15 1422  WBC 10.6  HGB 7.7*  HCT 24.8*  PLT COUNT MAY BE INACCURATE DUE TO FIBRIN CLUMPS.   ------------------------------------------------------------------------------------------------------------------  Chemistries   Recent Labs Lab 06/08/15 0731  06/09/15 0526 06/10/15 0816  NA 134*  --  135 138  K 3.7  --  3.6 4.2  CL 112*  --  112* 116*  CO2 16*  --  15* 17*  GLUCOSE 93  --  92 91  BUN 9  --  7 5*  CREATININE 0.91  --  0.86 0.90  CALCIUM 6.2*  --  6.6* 7.1*  MG  --   < > 1.6*  --   AST 41  --   --   --   ALT 11*  --   --   --   ALKPHOS 201*  --   --   --   BILITOT 1.2  --   --   --   < > = values in this interval not displayed. ------------------------------------------------------------------------------------------------------------------  Cardiac Enzymes No results for input(s): TROPONINI in the last 168 hours. ------------------------------------------------------------------------------------------------------------------  RADIOLOGY:  No results found.   ASSESSMENT AND PLAN:   45 m with alcohol abuse, seizures, hepatic encephalopathy here with diarrhea  * C diff On  IV flagyl and PO vancomycin.  IV  vancomycin and Zosyn stopped. Avoid other abx. Severe diarrhea with slow improvement Now rectal tube came out.  Will wait for recommendations of GI.  * septic and hypovolemic shock - Resolved  * Severe hypokalemia - Resolved  * Alcohol abuse  ON CIWA- no signs of withdrawal.  * Acute  encephalopathy Due to acute illness Ammonia normal. Lactulose held. On Rifaximin.  * Hem positive blood with anemia of chronic disease - likely from c diff Waiting for EGD to r/o upper Gi bleed.  * Acute renal failure due to ATN- Resolved.  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  All the records are reviewed and case discussed with Care Management/Social Workerr. Management plans discussed with the patient, family and they are in agreement.  CODE STATUS: FULL  DVT Prophylaxis: SCDs. No heparin due to blood in stool  TOTAL TIME TAKING CARE OF THIS PATIENT: 35 minutes.    Vaughan Basta M.D on 06/11/2015 at 4:18 PM  Between 7am to 6pm - Pager - [phone]  After 6pm go to [email or web address] - password [email or web address] [facility]  [facility]  Office  [phone]  CC: Primary care physician; No primary care provider on file.

## 2015-06-11 NOTE — Progress Notes (Signed)
GI Inpatient Follow-up Note  Patient Identification: Troy [name] is a 44 y.o. male with ETOH cirrhosis, now a/w severe c.diff, droppign HGb  Subjective:  Diarrhea decreased but still "several times per hour". + Crampy abd pain. No blood in stool. Wants diet advanced. No f/c, n/v.  Too weak to get OOB.   Scheduled Inpatient Medications:  . allopurinol  300 mg Oral Daily  . antiseptic oral rinse  7 mL Mouth Rinse BID  . folic acid  1 mg Oral Daily  . levothyroxine  150 mcg Oral Daily  . metoprolol tartrate  25 mg Oral BID  . metronidazole  500 mg Intravenous Q8H  . pantoprazole (PROTONIX) IV  40 mg Intravenous Q12H  . potassium chloride  40 mEq Oral BID  . rifaximin  550 mg Oral BID  . saccharomyces boulardii  250 mg Oral BID  . sodium chloride  3 mL Intravenous Q12H  . thiamine  100 mg Oral Daily   Or  . thiamine (VITAMIN B1) IVPB  100 mg Intravenous Daily  . vancomycin  500 mg Oral 4 times per day  . Vitamin D (Ergocalciferol)  50,000 Units Oral Q7 days    Continuous Inpatient Infusions:   . sodium chloride    . 0.9 % NaCl with KCl 20 mEq / L 75 mL/hr at 06/11/15 0711    PRN Inpatient Medications:  [DISCONTINUED] acetaminophen **OR** acetaminophen, gabapentin, labetalol, oxyCODONE, pregabalin, QUEtiapine   Physical Examination: BP 104/55 mmHg  Pulse 91  Temp(Src) 97.9 F (36.6 C) (Oral)  Resp 20  Ht 6' (1.829 m)  Wt 210 lb (95.255 kg)  BMI 28.47 kg/m2  SpO2 100% Gen: NAD, alert and oriented x 4 HEENT: PEERLA, EOMI, Neck: supple, no JVD or thyromegaly Chest: CTA bilaterally, no wheezes, crackles, or other adventitious sounds CV: RRR, no m/g/c/r Abd: + diffuse TTP, mild distension. , +BS in all four quadrants; no HSM, guarding, ridigity, or rebound tenderness Ext: well perfused, + mild edema.  Skin: no rash or lesions noted Lymph: no LAD  Data: Lab Results  Component Value Date   WBC 10.6 06/11/2015   HGB 7.7* 06/11/2015   HCT 24.8* 06/11/2015   MCV  103.7* 06/11/2015   PLT COUNT MAY BE INACCURATE DUE TO FIBRIN CLUMPS. 06/11/2015    Recent Labs Lab 06/09/15 0526 06/10/15 0816 06/11/15 1422  HGB 7.9* 8.7* 7.7*   Lab Results  Component Value Date   NA 138 06/10/2015   K 4.2 06/10/2015   CL 116* 06/10/2015   CO2 17* 06/10/2015   BUN 5* 06/10/2015   CREATININE 0.90 06/10/2015   Lab Results  Component Value Date   ALT 11* 06/08/2015   AST 41 06/08/2015   ALKPHOS 201* 06/08/2015   BILITOT 1.2 06/08/2015    Recent Labs Lab 06/07/15 1716  INR 1.33   Assessment/Plan: Mr. Gastelum is a 44 y.o. male with ETOH cirrhosis, now a/w severe c.diff, droppign HgB  Recommendations: - cont PO vanc, IV flagyl - diarrhea slowing but may require fecal transplant if does not continue to decrease. - would not start cholestyramine or imodium at this point for the diarrhea.  -If Hgb contiues to decline would recommend EGD given hx grade 1 varcies and large antral inflammatory polyp  Please call with questions or concerns.  REIN, Grace Blight, MD

## 2015-06-12 ENCOUNTER — Inpatient Hospital Stay: Payer: 59

## 2015-06-12 LAB — CBC
HEMATOCRIT: 24.1 % — AB (ref 40.0–52.0)
HEMOGLOBIN: 7.7 g/dL — AB (ref 13.0–18.0)
MCH: 33.3 pg (ref 26.0–34.0)
MCHC: 31.8 g/dL — ABNORMAL LOW (ref 32.0–36.0)
MCV: 104.7 fL — AB (ref 80.0–100.0)
PLATELETS: UNDETERMINED 10*3/uL (ref 150–440)
RBC: 2.31 MIL/uL — ABNORMAL LOW (ref 4.40–5.90)
RDW: 17.9 % — ABNORMAL HIGH (ref 11.5–14.5)
WBC: 8.5 10*3/uL (ref 3.8–10.6)

## 2015-06-12 LAB — PROTIME-INR
INR: 1.37
Prothrombin Time: 17.1 seconds — ABNORMAL HIGH (ref 11.4–15.0)

## 2015-06-12 LAB — CULTURE, BLOOD (ROUTINE X 2)

## 2015-06-12 LAB — PLATELET COUNT: PLATELETS: 94 10*3/uL — AB (ref 150–400)

## 2015-06-12 LAB — C DIFFICILE QUICK SCREEN W PCR REFLEX
C DIFFICILE (CDIFF) TOXIN: NEGATIVE
C Diff antigen: NEGATIVE
C Diff interpretation: NEGATIVE

## 2015-06-12 MED ORDER — OXYCODONE HCL 5 MG PO TABS
10.0000 mg | ORAL_TABLET | Freq: Four times a day (QID) | ORAL | Status: DC | PRN
  Administered 2015-06-12 – 2015-06-19 (×28): 10 mg via ORAL
  Administered 2015-06-20: 18:00:00 5 mg via ORAL
  Administered 2015-06-20 – 2015-06-21 (×5): 10 mg via ORAL
  Filled 2015-06-12 (×34): qty 2

## 2015-06-12 MED ORDER — ZINC OXIDE 40 % EX OINT
TOPICAL_OINTMENT | CUTANEOUS | Status: DC | PRN
  Administered 2015-06-12: 1 via TOPICAL
  Administered 2015-06-13 (×2): via TOPICAL
  Administered 2015-06-13: 1 via TOPICAL
  Administered 2015-06-17 – 2015-06-18 (×3): via TOPICAL
  Filled 2015-06-12 (×3): qty 56
  Filled 2015-06-12: qty 114
  Filled 2015-06-12 (×2): qty 56

## 2015-06-12 MED ORDER — FIDAXOMICIN 200 MG PO TABS
200.0000 mg | ORAL_TABLET | Freq: Two times a day (BID) | ORAL | Status: DC
  Administered 2015-06-12 – 2015-06-21 (×18): 200 mg via ORAL
  Filled 2015-06-12 (×19): qty 1

## 2015-06-12 NOTE — Progress Notes (Signed)
GI Inpatient Follow-up Note  Patient Identification: JAHMEZ BILY is a 44 y.o. male with severe c.diff diarrhea  Subjective:  Still multiple loose stools.  No blood in stool.  + Abd pain, unchanged.  Wants to advance diet.   Scheduled Inpatient Medications:  . allopurinol  300 mg Oral Daily  . antiseptic oral rinse  7 mL Mouth Rinse BID  . fidaxomicin  200 mg Oral BID  . folic acid  1 mg Oral Daily  . levothyroxine  150 mcg Oral Daily  . metoprolol tartrate  25 mg Oral BID  . metronidazole  500 mg Intravenous Q8H  . pantoprazole (PROTONIX) IV  40 mg Intravenous Q12H  . potassium chloride  40 mEq Oral BID  . rifaximin  550 mg Oral BID  . saccharomyces boulardii  250 mg Oral BID  . sodium chloride  3 mL Intravenous Q12H  . thiamine  100 mg Oral Daily   Or  . thiamine (VITAMIN B1) IVPB  100 mg Intravenous Daily  . vancomycin  500 mg Oral 4 times per day  . Vitamin D (Ergocalciferol)  50,000 Units Oral Q7 days    Continuous Inpatient Infusions:   . sodium chloride    . 0.9 % NaCl with KCl 20 mEq / L 75 mL/hr at 06/12/15 1242    PRN Inpatient Medications:  [DISCONTINUED] acetaminophen **OR** acetaminophen, gabapentin, labetalol, liver oil-zinc oxide, oxyCODONE, pregabalin, QUEtiapine   Physical Examination: BP 109/63 mmHg  Pulse 88  Temp(Src) 97.7 F (36.5 C) (Oral)  Resp 20  Ht 6' (1.829 m)  Wt 210 lb (95.255 kg)  BMI 28.47 kg/m2  SpO2 100% Gen: NAD, alert and oriented Neck: supple, no JVD or thyromegaly Chest: CTA bilaterally, no wheezes, crackles, or other adventitious sounds CV: RRR, no m/g/c/r Abd: + distended but not tense, mild diffuse ttp   Data: Lab Results  Component Value Date   WBC 8.5 06/12/2015   HGB 7.7* 06/12/2015   HCT 24.1* 06/12/2015   MCV 104.7* 06/12/2015   PLT 94* 06/12/2015    Recent Labs Lab 06/10/15 0816 06/11/15 1422 06/12/15 0619  HGB 8.7* 7.7* 7.7*   Lab Results  Component Value Date   NA 138 06/10/2015   K 4.2  06/10/2015   CL 116* 06/10/2015   CO2 17* 06/10/2015   BUN 5* 06/10/2015   CREATININE 0.90 06/10/2015   Lab Results  Component Value Date   ALT 11* 06/08/2015   AST 41 06/08/2015   ALKPHOS 201* 06/08/2015   BILITOT 1.2 06/08/2015    Recent Labs Lab 06/12/15 0619  INR 1.37   Assessment/Plan: Mr. Nilsson is a 44 y.o. male with c diff diarrhea. Refractory to PO vanc and IV flagyl.  - start dificid 200 mg BID - recheck c.diff PCR and toxins.   - Dr Vira Agar away until next Tuesday, so may need transfer to [facility] for stool transplant.    Please call with questions or concerns.  REIN, Grace Blight, MD

## 2015-06-12 NOTE — Clinical Social Work Note (Signed)
CSW met with pt to provide resources for alcohol abuse. Pt will review the material. CSW is signing off as no further needs identified. CW will however follow up with [place] [facility] as needed. Please reconsult if a need arises prior to discharge.   Darden Dates, MSW, LCSW Clinical Social Worker  [phone]

## 2015-06-12 NOTE — Progress Notes (Signed)
[facility] INFECTIOUS DISEASE PROGRESS NOTE Date of Admission:  06/06/2015     ID: Troy [name] is a 44 y.o. male with  Cirrhosis, C diff.  Active Problems:   Sepsis   Acute renal failure   Hypokalemia   Enteritis due to Clostridium difficile   Anemia of chronic disease   Hyponatremia   Hypocalcemia   Protein-calorie malnutrition, severe   Subjective: Less confused,still with many loose stools. Rectal tube came out over weekend with balloon inflated. He has lots of skin breakdown from the diarrhea.  ROS  Eleven systems are reviewed and negative except per hpi  Medications:  Antibiotics Given (last 72 hours)    Date/Time Action Medication Dose Rate   06/09/15 1749 Given   metroNIDAZOLE (FLAGYL) IVPB 500 mg 500 mg 100 mL/hr   06/09/15 1749 Given   vancomycin (VANCOCIN) 50 mg/mL oral solution 500 mg 500 mg    06/09/15 2202 Given   rifaximin (XIFAXAN) tablet 550 mg 550 mg    06/09/15 2347 Given   vancomycin (VANCOCIN) 50 mg/mL oral solution 500 mg 500 mg    06/10/15 0433 Given   metroNIDAZOLE (FLAGYL) IVPB 500 mg 500 mg 100 mL/hr   06/10/15 0556 Given   vancomycin (VANCOCIN) 50 mg/mL oral solution 500 mg 500 mg    06/10/15 0836 Given   rifaximin (XIFAXAN) tablet 550 mg 550 mg    06/10/15 1209 Given   metroNIDAZOLE (FLAGYL) IVPB 500 mg 500 mg 100 mL/hr   06/10/15 1209 Given   vancomycin (VANCOCIN) 50 mg/mL oral solution 500 mg 500 mg    06/10/15 1721 Given   vancomycin (VANCOCIN) 50 mg/mL oral solution 500 mg 500 mg    06/10/15 2012 Given   rifaximin (XIFAXAN) tablet 550 mg 550 mg    06/10/15 2012 Given   metroNIDAZOLE (FLAGYL) IVPB 500 mg 500 mg 100 mL/hr   06/10/15 2333 Given   vancomycin (VANCOCIN) 50 mg/mL oral solution 500 mg 500 mg    06/11/15 0501 Given   metroNIDAZOLE (FLAGYL) IVPB 500 mg 500 mg 100 mL/hr   06/11/15 0502 Given   vancomycin (VANCOCIN) 50 mg/mL oral solution 500 mg 500 mg    06/11/15 0947 Given   rifaximin (XIFAXAN) tablet 550 mg 550 mg     06/11/15 1239 Given   metroNIDAZOLE (FLAGYL) IVPB 500 mg 500 mg 100 mL/hr   06/11/15 1239 Given   vancomycin (VANCOCIN) 50 mg/mL oral solution 500 mg 500 mg    06/11/15 1752 Given   vancomycin (VANCOCIN) 50 mg/mL oral solution 500 mg 500 mg    06/11/15 2118 Given   metroNIDAZOLE (FLAGYL) IVPB 500 mg 500 mg 100 mL/hr   06/11/15 2119 Given   rifaximin (XIFAXAN) tablet 550 mg 550 mg    06/12/15 0027 Given   vancomycin (VANCOCIN) 50 mg/mL oral solution 500 mg 500 mg    06/12/15 0432 Given   metroNIDAZOLE (FLAGYL) IVPB 500 mg 500 mg 100 mL/hr   06/12/15 8144 Given   vancomycin (VANCOCIN) 50 mg/mL oral solution 500 mg 500 mg    06/12/15 1037 Given   rifaximin (XIFAXAN) tablet 550 mg 550 mg    06/12/15 1252 Given   metroNIDAZOLE (FLAGYL) IVPB 500 mg 500 mg 100 mL/hr   06/12/15 1252 Given   vancomycin (VANCOCIN) 50 mg/mL oral solution 500 mg 500 mg      . allopurinol  300 mg Oral Daily  . antiseptic oral rinse  7 mL Mouth Rinse BID  . folic acid  1 mg Oral Daily  . levothyroxine  150 mcg Oral Daily  . metoprolol tartrate  25 mg Oral BID  . metronidazole  500 mg Intravenous Q8H  . pantoprazole (PROTONIX) IV  40 mg Intravenous Q12H  . potassium chloride  40 mEq Oral BID  . rifaximin  550 mg Oral BID  . saccharomyces boulardii  250 mg Oral BID  . sodium chloride  3 mL Intravenous Q12H  . thiamine  100 mg Oral Daily   Or  . thiamine (VITAMIN B1) IVPB  100 mg Intravenous Daily  . vancomycin  500 mg Oral 4 times per day  . Vitamin D (Ergocalciferol)  50,000 Units Oral Q7 days    Objective: Vital signs in last 24 hours: Temp:  [97.7 F (36.5 C)-97.8 F (36.6 C)] 97.7 F (36.5 C) (06/28 1338) Pulse Rate:  [88-96] 88 (06/28 1338) Resp:  [18-20] 20 (06/28 1338) BP: (109-114)/(63-74) 109/63 mmHg (06/28 1338) SpO2:  [97 %-100 %] 100 % (06/28 1338) Constitutional: obese, somewhat confused, chornically ill appearing  HENT:  Mouth/Throat: Oropharynx is clear and dry. No oropharyngeal  exudate.  Cardiovascular: tachy  Pulmonary/Chest: Effort normal and breath sounds normal. No respiratory distress. He has no wheezes.  Abdominal: Soft. Distended, mild diffuse ttp Bowel sounds are normal. Rectal with large ext hemorrhoids, poor rectal tone, he is lying in stool lots of excorciations  foley in place Lymphadenopathy:  He has no cervical adenopathy.  Neurological: He is awake but groggy and poor historian, + asterisix  Skin: Skin is warm and dry. No rash noted. No erythema.  Psychiatric: groggy  Lab Results  Recent Labs  06/10/15 [phone] 06/11/15 1422 06/12/15 0619  WBC 9.9 10.6 8.5  HGB 8.7* 7.7* 7.7*  HCT 26.8* 24.8* 24.1*  NA 138  --   --   K 4.2  --   --   CL 116*  --   --   CO2 17*  --   --   BUN 5*  --   --   CREATININE 0.90  --   --    Microbiology: Results for orders placed or performed during the hospital encounter of 06/06/15  Blood Culture (routine x 2)     Status: None   Collection Time: 06/06/15  4:14 PM  Result Value Ref Range Status   Specimen Description BLOOD  Final   Special Requests NONE  Final   Culture NO GROWTH 5 DAYS  Final   Report Status 06/11/2015 FINAL  Final  Stool culture     Status: None   Collection Time: 06/06/15  4:14 PM  Result Value Ref Range Status   Specimen Description STOOL  Final   Special Requests Normal  Final   Culture   Final    NO SALMONELLA OR SHIGELLA ISOLATED No Pathogenic E. coli detected    Report Status 06/10/2015 FINAL  Final  C difficile quick scan w PCR reflex ([facility] only)     Status: None   Collection Time: 06/06/15  4:20 PM  Result Value Ref Range Status   C Diff antigen POSITIVE  Final   C Diff toxin NEGATIVE  Final   C Diff interpretation   Final    Positive for toxigenic C. difficile, active toxin production not detected. Patient has toxigenic C. difficile organisms present in the bowel, but toxin was not detected. The patient may be a carrier or the level of toxin in the sample was below the  limit  of detection. This information should  be used in conjunction with the patient's clinical history when deciding on possible therapy.     Comment: CRITICAL RESULT CALLED TO, READ BACK BY AND VERIFIED WITH: LISA THOMPSON 06/06/15 AT 2033.SDR   Blood Culture (routine x 2)     Status: None   Collection Time: 06/06/15  4:20 PM  Result Value Ref Range Status   Specimen Description BLOOD  Final   Special Requests NONE  Final   Culture  Setup Time   Final    GRAM POSITIVE RODS ANAEROBIC BOTTLE ONLY CRITICAL RESULT CALLED TO, READ BACK BY AND VERIFIED WITH: Kathi Simpers AT 7253 06/07/15 DV    Culture   Final    CLOSTRIDIUM PARAPUTRIFICUM ANAEROBIC BOTTLE ONLY COAGULASE NEGATIVE STAPHYLOCOCCUS AEROBIC BOTTLE ONLY POSSIBLE CONTAMINATION WITH SKIN FLORA     Report Status 06/12/2015 FINAL  Final   Organism ID, Bacteria CLOSTRIDIUM PARAPUTRIFICUM  Final  Clostridium Difficile by PCR (not at [facility])     Status: Abnormal   Collection Time: 06/06/15  4:20 PM  Result Value Ref Range Status   C difficile by pcr POSITIVE (A) NEGATIVE Final    Comment: Positive for toxigenic C. difficile, active toxin production not detected. Patient has toxigenic C. difficile organisms present in the bowel, but toxin was not detected. The patient may be a carrier or the level of toxin in the sample was below the limit  of detection. This information should be used in conjunction with the patient's clinical history when deciding on possible therapy. CRITICAL RESULT CALLED TO, READ BACK BY AND VERIFIED WITH: LISA THOMPSON 06/06/15 2030. SDW   MRSA PCR Screening     Status: None   Collection Time: 06/06/15  9:00 PM  Result Value Ref Range Status   MRSA by PCR NEGATIVE NEGATIVE Final    Comment:        The GeneXpert MRSA Assay (FDA approved for NASAL specimens only), is one component of a comprehensive MRSA colonization surveillance program. It is not intended to diagnose MRSA infection nor to guide  or monitor treatment for MRSA infections.   Urine culture     Status: None   Collection Time: 06/07/15  3:41 PM  Result Value Ref Range Status   Specimen Description URINE, CLEAN CATCH  Final   Special Requests NONE  Final   Culture NO GROWTH 2 DAYS  Final   Report Status 06/09/2015 FINAL  Final    Studies/Results: No results found.  Assessment/Plan: BERTHOLD GLACE is a 44 y.o. male with a known history of recent admission for sepsis, alcohol related seizures, metabolic , hepatic encephalopathy for which he was discharged on lactulose and Xifaxan admitted 6/22 with complaints of ongoing diarrhea and feeling dehydrated and weak. He was noted to be hypotensive with systolic blood pressure in 60s and had marked leukocytpsis.  Of note he was admitted 5/29-6/2 with sepsis and C diff. At that time he was treated with IV abx in ICU - dced on levo and flagyl at that time. He appears to be continuing to drink with + etoh level on admission. Since admit he has been in ICU on fluid resuscitation but not on pressors. BP improving. Diarrhea persists with large volume in rectal tube and now bloody. WBC improved. Gates 1/2 + CNS and C diff + I suspect this is all C diff although could have SBP or other ifection.  CNS bacteremia likely contaminant. No further fevers, wbc down to 9 but diarrhea persists and lots of excoriated skin.  Recommendations Continue IV flagyl and oral vanco (there is some data that critically ill patients with C diff do better with this double coverage initially) Hold other abx unless worsens Agree with possible need for fecal transplant.  If worsens would repeat bcx, check diagnostic paracentesis to eval for SBP and consider CT abd prior to starting other abx. Thank you very much for the consult. Will follow with you. [place], [place]   06/12/2015, 3:04 PM

## 2015-06-12 NOTE — Progress Notes (Signed)
Initial Nutrition Assessment  INTERVENTION:  Medical Food Supplement Therapy: will send [employer] Instant Breakfast on meal trays for added nutrition    NUTRITION DIAGNOSIS:  Inadequate oral intake related to altered GI function as evidenced by  (current diet order, no solids since admission).  GOAL:   (Diet advancement as medically able)  MONITOR:   (Energy Intake, digestive system, Anthropometrics)  REASON FOR ASSESSMENT:   (RD Screen, Length of Stay)    ASSESSMENT:  Pt admitted with diarrhea, rectal bleeding with acute renal failure and sepsis. GI following. PMHx:  Past Medical History  Diagnosis Date  . Liver damage   . Hypertension   . Neuropathy   . Seizures     complicated ETOH withdrawal  . Pancytopenia, acquired   . Ascites due to alcoholic cirrhosis   . DTs (delirium tremens)   . Hypothyroidism   . Hepatitis C      Diet Order:  Diet full liquid Room service appropriate?: Yes; Fluid consistency:: Thin  Current Nutrition: Pt ate 100% of grits and 2 chocolate soy milk drinks this am. Pt reports tolerating liquids well since admission.  Food/Nutrition-Related History: Pt reports good appetite PTA eating 2-3 meals per day. Pt reports having Ensure/Carnation Instant Breakfast in the past. RD notes pt with h/o EtOH use, pt on CIWA not scoring per Nsg.   Medications: Flagyl, NS with KCl at 68mL/hr  Electrolyte/Renal Profile and Glucose Profile:   Recent Labs Lab 06/07/15 0736 06/07/15 1508 06/08/15 0731 06/08/15 0927 06/09/15 0526 06/10/15 0816  NA 133*  --  134*  --  135 138  K 2.1* 2.8* 3.7  --  3.6 4.2  CL 101  --  112*  --  112* 116*  CO2 19*  --  16*  --  15* 17*  BUN 15  --  9  --  7 5*  CREATININE 0.90  --  0.91  --  0.86 0.90  CALCIUM 5.5*  --  6.2*  --  6.6* 7.1*  MG 1.3* 2.1  --  1.6* 1.6*  --   PHOS 2.4*  --   --  1.5* 2.9  --   GLUCOSE 157*  --  93  --  92 91   Protein Profile:  Recent Labs Lab 06/06/15 1614 06/07/15 0736  06/08/15 0731  ALBUMIN 2.4* 1.9* 1.7*    Gastrointestinal Profile: Last BM: multiple loose stools this am, +diarrhea. Pt s/p rectal tube but since removed   Nutrition-Focused Physical Exam Findings:  Unable to complete Nutrition-Focused physical exam at this time.    Weight Change: Pt reports stable weight of 230lbs PTA. RD weighed pt on visit, bed read 242.7lbs. Pt 210lbs in CHL. Anthropometrics:  Height:  Ht Readings from Last 1 Encounters:  06/06/15 6' (1.829 m)    Weight:  Wt Readings from Last 1 Encounters:  06/06/15 210 lb (95.255 kg)    Ideal Body Weight:   80.9kg  Wt Readings from Last 10 Encounters:  06/06/15 210 lb (95.255 kg)  05/14/15 230 lb 9.6 oz (104.6 kg)    BMI:  Body mass index is 28.47 kg/(m^2).  Estimated Nutritional Needs:  Kcal:  2286-2701kcals, BEE: 1731kcals, TEE: (IF 1.1-1.3)(AF 1.2) using IBW of 80.9kg  Protein:  80-97g protein (1.0-1.2g/kg) using IBW of 80.9kg  Fluid:  2023-2417mL of fluid (25-26mL/kg) using IBW of 80.9kg  EDUCATION NEEDS:  No education needs identified at this time   Intake/Output Summary (Last 24 hours) at 06/12/15 1234 Last data filed at 06/12/15 1039  Gross per 24 hour  Intake   1095 ml  Output   1625 ml  Net   -530 ml    MODERATE Care Level  Dwyane Luo, RD, LDN Pager [phone]

## 2015-06-12 NOTE — Plan of Care (Signed)
Tranferred care of pt to [place] at 12:30.  Pt c/o of abd pain.  Continues to have loose, watery brown stools. Pt's stool has red tinge per Nurse Tech.  Platelet count low at 94.  Would like diet advanced - this was communicated to dr.

## 2015-06-12 NOTE — Plan of Care (Signed)
Problem: Discharge Progression Outcomes Goal: Other Discharge Outcomes/Goals Outcome: Progressing Patient is alert and oriented, still has large watery diarrhea, once down to his feet. Excoriation in groin, buttocks and in between legs remains, barrier cream applied. C/o pain in this area, oxycodone given with relief. Seroquel given to help him sleep. Not scoring on the CIWA scale.

## 2015-06-12 NOTE — Plan of Care (Signed)
Problem: Discharge Progression Outcomes Goal: Other Discharge Outcomes/Goals Outcome: Progressing Picked up patient at 1230. Plan of care progress to goal for: Sepsis: On ABX. cdiff negative, ordered from Dr. Manuella Ghazi to discontinue enteric precautions, pt still having large bowel movements. Afebrile. C/o pain, oxycodone given PRN. Pt stated relief.

## 2015-06-12 NOTE — Progress Notes (Signed)
[facility] [place] NAME: Troy [name]    MR#:  [id number]  DATE OF BIRTH:  08/17/1971  SUBJECTIVE:  CHIEF COMPLAINT:   Chief Complaint  Patient presents with  . Diarrhea   Still has significant diarrhea Mild abd pain. Afebrile. Feels weak. Urinary catheter removed- pt have no problems with urinations now.  REVIEW OF SYSTEMS:    Review of Systems  Constitutional: Positive for malaise/fatigue and diaphoresis. Negative for fever.  HENT: Negative for sore throat.   Eyes: Negative for blurred vision, double vision and pain.  Respiratory: Positive for cough. Negative for hemoptysis, shortness of breath and wheezing.   Cardiovascular: Negative for chest pain, palpitations, orthopnea and leg swelling.  Gastrointestinal: Positive for nausea, abdominal pain and diarrhea. Negative for heartburn, vomiting and constipation.  Genitourinary: Negative for dysuria and hematuria.  Musculoskeletal: Positive for myalgias. Negative for back pain and joint pain.  Skin: Negative for rash.  Neurological: Positive for weakness and headaches. Negative for sensory change, speech change and focal weakness.  Endo/Heme/Allergies: Bruises/bleeds easily.  Psychiatric/Behavioral: Positive for depression. The patient is not nervous/anxious.     DRUG ALLERGIES:  No Known Allergies  VITALS:  Blood pressure 114/65, pulse 95, temperature 97.8 F (36.6 C), temperature source Oral, resp. rate 18, height 6' (1.829 m), weight 95.255 kg (210 lb), SpO2 100 %.  PHYSICAL EXAMINATION:   Physical Exam  GENERAL:  44 y.o.-year-old patient lying in the bed EYES: Pupils equal, round, reactive to light and accommodation. No scleral icterus. Extraocular muscles intact.  HEENT: Head atraumatic, normocephalic. Oropharynx and nasopharynx clear.  NECK:  Supple, no jugular venous distention. No thyroid enlargement, no tenderness.  LUNGS: Normal breath sounds bilaterally, no  wheezing, rales, rhonchi. No use of accessory muscles of respiration.  CARDIOVASCULAR: S1, S2 normal. No murmurs, rubs, or gallops.  ABDOMEN: Soft, tender, nondistended. Bowel sounds present. No organomegaly or mass.  EXTREMITIES: No cyanosis, clubbing. 1+ edema bilateral LE  NEUROLOGIC: Cranial nerves II through XII are intact. No focal Motor or sensory deficits b/l.   PSYCHIATRIC: The patient is awake and alert. Oriented times 3 SKIN: No obvious rash, lesion, or ulcer.   LABORATORY PANEL:   CBC  Recent Labs Lab 06/12/15 0619 06/12/15 1040  WBC 8.5  --   HGB 7.7*  --   HCT 24.1*  --   PLT PLATELET CLUMPS NOTED ON SMEAR, UNABLE TO ESTIMATE 94*   ------------------------------------------------------------------------------------------------------------------  Chemistries   Recent Labs Lab 06/08/15 0731  06/09/15 0526 06/10/15 0816  NA 134*  --  135 138  K 3.7  --  3.6 4.2  CL 112*  --  112* 116*  CO2 16*  --  15* 17*  GLUCOSE 93  --  92 91  BUN 9  --  7 5*  CREATININE 0.91  --  0.86 0.90  CALCIUM 6.2*  --  6.6* 7.1*  MG  --   < > 1.6*  --   AST 41  --   --   --   ALT 11*  --   --   --   ALKPHOS 201*  --   --   --   BILITOT 1.2  --   --   --   < > = values in this interval not displayed. ------------------------------------------------------------------------------------------------------------------  Cardiac Enzymes No results for input(s): TROPONINI in the last 168 hours. ------------------------------------------------------------------------------------------------------------------  RADIOLOGY:  No results found.   ASSESSMENT AND PLAN:   10 m  with alcohol abuse, seizures, hepatic encephalopathy here with diarrhea  * C diff On  IV flagyl and PO vancomycin. Avoid other abx. Severe diarrhea with slow improvement Now rectal tube came out.  GI is thinking about stool transplant in 1-2 days , if does not improve.  * septic and hypovolemic shock -  Resolved  * Severe hypokalemia - Resolved  * Alcohol abuse.  on CIWA- no signs of withdrawal.  * Acute encephalopathy Due to acute illness Ammonia normal. Lactulose held. On Rifaximin. Resolved now.  * Hem positive blood with anemia of chronic disease - likely from c diff Waiting for EGD to r/o upper Gi bleed.  * Acute renal failure due to ATN- Resolved.  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  All the records are reviewed and case discussed with Care Management/Social Workerr. Management plans discussed with the patient, family and they are in agreement.  CODE STATUS: FULL  DVT Prophylaxis: SCDs. No heparin due to blood in stool  TOTAL TIME TAKING CARE OF THIS PATIENT: 35 minutes.    Vaughan Basta M.D on 06/12/2015 at 12:45 PM  Between 7am to 6pm - Pager - [phone]  After 6pm go to [email or web address] - password [email or web address]  [facility]  Office  [phone]  CC: Primary care physician; No primary care provider on file.

## 2015-06-12 NOTE — Progress Notes (Signed)
Patient c/o of pain 7/10, excoriated groin and buttocks from constant diarrhea. Only has 5 mg of oxycodone, needs more pain medication. MD ordered 10 mg oxycodone q 6 hours PRN.

## 2015-06-13 MED ORDER — CHOLESTYRAMINE LIGHT 4 G PO PACK
4.0000 g | PACK | Freq: Three times a day (TID) | ORAL | Status: DC
  Administered 2015-06-13 – 2015-06-21 (×23): 4 g via ORAL
  Filled 2015-06-13 (×23): qty 1

## 2015-06-13 MED ORDER — METRONIDAZOLE IN NACL 5-0.79 MG/ML-% IV SOLN
500.0000 mg | Freq: Three times a day (TID) | INTRAVENOUS | Status: DC
  Administered 2015-06-13 – 2015-06-14 (×4): 500 mg via INTRAVENOUS
  Filled 2015-06-13 (×7): qty 100

## 2015-06-13 NOTE — Plan of Care (Addendum)
Problem: Discharge Progression Outcomes Goal: Other Discharge Outcomes/Goals Outcome: Progressing Plan of care progress to goal: C/o pain in abdomen, oxycodone given PRN for abdominal pain. Pt stated relief.  Remains on enteric precautions per policy regardless of patient CDiff negative. Having loose stools still. On IV flagyl. IVF discontinued. desitin cream for excoriated bottom. Pt states his bottom is feeling better. PT consult done.

## 2015-06-13 NOTE — Progress Notes (Signed)
   06/13/15 0907  Clinical Encounter Type  Visited With Patient  Visit Type Initial  Referral From Nurse  Consult/Referral To Nurse  Spiritual Encounters  Spiritual Needs Emotional;Prayer  Stress Factors  Patient Stress Factors Health changes;Family relationships  Visited with patient in his room.  Patient remains concerned about his health and diagnosis.  He is being told that a transfer to [facility] and a procedure are the best option, and while he says that he understands this, he also told me that it is an inconvenience for him and his family to leave this area.  He was very pleasant and calm and I enjoyed speaking with him.  Patient asked me to pray with him and for him which I did so.  Patient thanked me for coming to see him.  [place] [phone]

## 2015-06-13 NOTE — Plan of Care (Signed)
Problem: Discharge Progression Outcomes Goal: Other Discharge Outcomes/Goals Outcome: Progressing Patient is alert and oriented, c/o pain in groin area, oxycodone increased to 10 mg q 6 hours PRN, provides relief. Patient at times refuses to be cleaned up when he has diarrhea, also refuses to lay on his side. Continue with PO antibiotics.

## 2015-06-13 NOTE — Evaluation (Signed)
Physical Therapy Evaluation Patient Details Name: Troy [name] MRN: [id number] DOB: [birth year] Today's Date: 06/13/2015   History of Present Illness  Troy [name] is a 44 y.o. male with a history of liver disease as well as alcoholismrecently discharged from the hospital on [place] for sepsis believed to be from spontaneous bacterial peritonitis, presents to the hospital with uncontrolled diarrhea. The patient says that he was having diarrhea last time he was in the hospital but it has worsened. He called the fire department yesterday to help him get a shower because the diarrhea had become so far out of control. Per the medics today, the fire chief called the Department of Social Services but did not receive a call back from social services. It is unclear if a formal report has been filed at this time. Patient then called back to EMS today because he wasn't able to walk. This was after he had driven himself to the liquor store and brought vodka and drank about half the bottle. Per EMS, he had a trail of feces trailing him out of the liquor store. Also per EMS, they say that his house is uninhabitable because of the amount of stool covering the surfaces. They said that his family has left because of the amount of fecal matter. The patient is not complaining of any pain at this time. However, his history and physical is confounded by a decreased mental status. Possibly secondary to sepsis or alcohol intoxication.  Clinical Impression  Pt demonstrates generalized weakness with functional mobility. He is able to perform some limited walking at EOB but due to safety concerns and loss of stool unable to ambulate further. Pt would like to return home at discharge. He is poorly motivated to improve based on general sedentary lifestyle as self-reported by patient. Pt will benefit from skilled PT services to address deficits in strength, balance, and mobility in order to return to full function at home.     Follow  Up Recommendations Home health PT    Equipment Recommendations  None recommended by PT    Recommendations for Other Services       Precautions / Restrictions Precautions Precautions: Fall Restrictions Weight Bearing Restrictions: No      Mobility  Bed Mobility Overal bed mobility: Modified Independent (heavy use of bed rails) Bed Mobility: Supine to Sit;Sit to Supine   Sidelying to sit: Supervision Supine to sit: Supervision Sit to supine: Supervision   General bed mobility comments: Pt with heavy use of bed rails  Transfers Overall transfer level: Needs assistance Equipment used: Rolling walker (2 wheeled) Transfers: Sit to/from Stand Sit to Stand: Min guard         General transfer comment: uses wide BOS to stand. Good hand placement and sequencing. Tremulous LEs  Ambulation/Gait Ambulation/Gait assistance: Min assist Ambulation Distance (Feet): 3 Feet Assistive device: Rolling walker (2 wheeled) Gait Pattern/deviations: Step-to pattern Gait velocity: Reduced   General Gait Details: Pt able to take a few small steps foward/backward at EOB. Appears somewhat unsteady and pt fatigues easily. Due to concerns for safety performed standing marching at EOB and pt is able to march for 30 seconds without LOB. Pt reports LE fatigue but overall reports feeling strong and stable. Pt defecates in diaper during standing. Unable to attempt further ambulation at this time  Stairs            Wheelchair Mobility    Modified Rankin (Stroke Patients Only)       Balance Overall balance  assessment: Modified Independent Sitting-balance support: No upper extremity supported;Feet supported Sitting balance-Leahy Scale: Fair     Standing balance support: Bilateral upper extremity supported Standing balance-Leahy Scale: Poor                               Pertinent Vitals/Pain      Home Living Family/patient expects to be discharged to:: Private  residence Living Arrangements: Spouse/significant other (Per medical record family has left. Pt reports lives c wife) Available Help at Discharge: Family;Available PRN/intermittently Type of Home: House Home Access: Ramped entrance     Home Layout: One level Home Equipment: Walker - 2 wheels;Walker - 4 wheels;Cane - quad;Cane - single point;Shower seat      Prior Function Level of Independence: Independent with assistive device(s)         Comments: previously working as Oceanographer, on disability     Journalist, newspaper        Extremity/Trunk Assessment   Upper Extremity Assessment: Generalized weakness           Lower Extremity Assessment: Generalized weakness         Communication   Communication: No difficulties  Cognition Arousal/Alertness: Awake/alert Behavior During Therapy: WFL for tasks assessed/performed Overall Cognitive Status: Within Functional Limits for tasks assessed                      General Comments      Exercises        Assessment/Plan    PT Assessment Patient needs continued PT services  PT Diagnosis Generalized weakness   PT Problem List Decreased strength;Decreased range of motion;Decreased activity tolerance;Decreased balance;Decreased mobility;Decreased coordination;Cardiopulmonary status limiting activity;Obesity  PT Treatment Interventions DME instruction;Gait training;Functional mobility training;Therapeutic activities;Therapeutic exercise;Balance training;Neuromuscular re-education;Patient/family education   PT Goals (Current goals can be found in the Care Plan section) Acute Rehab PT Goals Patient Stated Goal: "I would like to return home" PT Goal Formulation: With patient Time For Goal Achievement: 06/27/15 Potential to Achieve Goals: Good    Frequency Min 2X/week   Barriers to discharge Inaccessible home environment;Decreased caregiver support      Co-evaluation               End of Session  Equipment Utilized During Treatment: Gait belt Activity Tolerance: Patient limited by fatigue Patient left: in bed;with call bell/phone within reach;with bed alarm set Nurse Communication: Mobility status         Time: 1555-1620 PT Time Calculation (min) (ACUTE ONLY): 25 min   Charges:   PT Evaluation $Initial PT Evaluation Tier I: 1 Procedure     PT G Codes:       Lyndel Safe Troy [name] PT, DPT   Troy [name],Troy [name] 06/13/2015, 5:03 PM

## 2015-06-13 NOTE — Progress Notes (Signed)
[place] [facility] [place] NAME: Troy [name]    MR#:  [id number]  DATE OF BIRTH:  [birth year]  SUBJECTIVE:  CHIEF COMPLAINT:   Chief Complaint  Patient presents with  . Diarrhea   Still has significant diarrhea Mild abd pain. Afebrile. Feels weak. Urinary catheter removed- pt have no problems with urinations now.  C diff negative. He said- he has neuropathy and always had problem in walking for last one year due to atrophy.  REVIEW OF SYSTEMS:    Review of Systems  Constitutional: Positive for malaise/fatigue and diaphoresis. Negative for fever.  HENT: Negative for sore throat.   Eyes: Negative for blurred vision, double vision and pain.  Respiratory: Positive for cough. Negative for hemoptysis, shortness of breath and wheezing.   Cardiovascular: Negative for chest pain, palpitations, orthopnea and leg swelling.  Gastrointestinal: Positive for nausea, abdominal pain and diarrhea. Negative for heartburn, vomiting and constipation.  Genitourinary: Negative for dysuria and hematuria.  Musculoskeletal: Positive for myalgias. Negative for back pain and joint pain.  Skin: Negative for rash.  Neurological: Positive for weakness and headaches. Negative for sensory change, speech change and focal weakness.  Endo/Heme/Allergies: Bruises/bleeds easily.  Psychiatric/Behavioral: Positive for depression. The patient is not nervous/anxious.     DRUG ALLERGIES:  No Known Allergies  VITALS:  Blood pressure 105/65, pulse 92, temperature 97.8 F (36.6 C), temperature source Oral, resp. rate 18, height 6' (1.829 m), weight 95.255 kg (210 lb), SpO2 100 %.  PHYSICAL EXAMINATION:   Physical Exam  GENERAL:  44 y.o.-year-old patient lying in the bed EYES: Pupils equal, round, reactive to light and accommodation. No scleral icterus. Extraocular muscles intact.  HEENT: Head atraumatic, normocephalic. Oropharynx and nasopharynx clear.  NECK:  Supple, no  jugular venous distention. No thyroid enlargement, no tenderness.  LUNGS: Normal breath sounds bilaterally, no wheezing, rales, rhonchi. No use of accessory muscles of respiration.  CARDIOVASCULAR: S1, S2 normal. No murmurs, rubs, or gallops.  ABDOMEN: Soft, tender, nondistended. Bowel sounds present. No organomegaly or mass.  EXTREMITIES: No cyanosis, clubbing. 1+ edema bilateral LE  NEUROLOGIC: Cranial nerves II through XII are intact. No focal Motor or sensory deficits b/l.   PSYCHIATRIC: The patient is awake and alert. Oriented times 3 SKIN: No obvious rash, lesion, or ulcer.   LABORATORY PANEL:   CBC  Recent Labs Lab 06/12/15 0619 06/12/15 1040  WBC 8.5  --   HGB 7.7*  --   HCT 24.1*  --   PLT PLATELET CLUMPS NOTED ON SMEAR, UNABLE TO ESTIMATE 94*   ------------------------------------------------------------------------------------------------------------------  Chemistries   Recent Labs Lab 06/08/15 0731  06/09/15 0526 06/10/15 0816  NA 134*  --  135 138  K 3.7  --  3.6 4.2  CL 112*  --  112* 116*  CO2 16*  --  15* 17*  GLUCOSE 93  --  92 91  BUN 9  --  7 5*  CREATININE 0.91  --  0.86 0.90  CALCIUM 6.2*  --  6.6* 7.1*  MG  --   < > 1.6*  --   AST 41  --   --   --   ALT 11*  --   --   --   ALKPHOS 201*  --   --   --   BILITOT 1.2  --   --   --   < > = values in this interval not displayed. ------------------------------------------------------------------------------------------------------------------  Cardiac Enzymes No results for input(s):  TROPONINI in the last 168 hours. ------------------------------------------------------------------------------------------------------------------  RADIOLOGY:  Dg Abd 1 View  06/12/2015   CLINICAL DATA:  Abdominal pain  EXAM: ABDOMEN - 1 VIEW  COMPARISON:  CT abdomen and pelvis February 22, 2014  FINDINGS: There is no bowel dilatation or air-fluid level suggesting obstruction. No free air is seen on this supine  examination. 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  IMPRESSION: Bowel gas pattern unremarkable. No demonstrable obstruction or free air. 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 Extensive avascular necrosis right femoral head.   Electronically Signed   By: Lowella Grip III M.D.   On: 06/12/2015 18:58    ASSESSMENT AND PLAN:   80 m with alcohol abuse, seizures, hepatic encephalopathy here with diarrhea  * C diff On  IV flagyl and PO vancomycin. Now started on deficid. Avoid other abx. Severe diarrhea with slow improvement- c diff  Negative now. GI was thinking about stool transplant, but now with negative c diff- he may need scope.  started on cholestyramine.  * septic and hypovolemic shock - Resolved  * Severe hypokalemia - Resolved  * Alcohol abuse.  on CIWA- no signs of withdrawal.  stopped.  * Acute encephalopathy Due to acute illness Ammonia normal. Lactulose held. On Rifaximin. Resolved now.  * Hem positive blood with anemia of chronic disease - likely from c diff Waiting for EGD to r/o upper Gi bleed.  * Acute renal failure due to ATN- Resolved.  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   I advised him to follow with a neurologist.  All the records are reviewed and case discussed with Care Management/Social Workerr. Management plans discussed with the patient, family and they are in agreement.  CODE STATUS: FULL  DVT Prophylaxis: SCDs. No heparin due to blood in stool  TOTAL TIME TAKING CARE OF THIS PATIENT: 35 minutes.    Vaughan Basta M.D on 06/13/2015 at 1:03 PM  Between 7am to 6pm - Pager - [phone]  After 6pm go to [email or web address] - password [email or web address] [facility]  [place] [facility]  Office  [phone]  CC: Primary care physician; No primary care provider on file.

## 2015-06-14 LAB — BASIC METABOLIC PANEL
Anion gap: 6 (ref 5–15)
BUN: 9 mg/dL (ref 6–20)
CALCIUM: 7.4 mg/dL — AB (ref 8.9–10.3)
CHLORIDE: 119 mmol/L — AB (ref 101–111)
CO2: 17 mmol/L — AB (ref 22–32)
CREATININE: 1.1 mg/dL (ref 0.61–1.24)
GFR calc Af Amer: >60 mL/min (ref >60)
GFR calc non Af Amer: >60 mL/min (ref >60)
GLUCOSE: 89 mg/dL (ref 65–99)
POTASSIUM: 3.7 mmol/L (ref 3.5–5.1)
SODIUM: 142 mmol/L (ref 135–145)

## 2015-06-14 LAB — GIARDIA, EIA; OVA/PARASITE: Giardia Ag, Stl: NEGATIVE

## 2015-06-14 LAB — PROTIME-INR
INR: 1.31
PROTHROMBIN TIME: 16.5 s — AB (ref 11.4–15.0)

## 2015-06-14 LAB — O&P RESULT

## 2015-06-14 MED ORDER — PANTOPRAZOLE SODIUM 40 MG PO TBEC
40.0000 mg | DELAYED_RELEASE_TABLET | Freq: Two times a day (BID) | ORAL | Status: DC
  Administered 2015-06-14 – 2015-06-21 (×14): 40 mg via ORAL
  Filled 2015-06-14 (×14): qty 1

## 2015-06-14 NOTE — Progress Notes (Signed)
GI Inpatient Follow-up Note  Patient Identification: Troy [name] is a 44 y.o. male with c.diff diarrea  Subjective:  Diarrhea improving.  One eposide overnight.  Few episodes today.   No bleeding.  Abd pain improved.   Scheduled Inpatient Medications:  . allopurinol  300 mg Oral Daily  . cholestyramine light  4 g Oral TID  . fidaxomicin  200 mg Oral BID  . folic acid  1 mg Oral Daily  . levothyroxine  150 mcg Oral Daily  . metoprolol tartrate  25 mg Oral BID  . metronidazole  500 mg Intravenous Q8H  . pantoprazole  40 mg Oral BID  . potassium chloride  40 mEq Oral BID  . saccharomyces boulardii  250 mg Oral BID  . sodium chloride  3 mL Intravenous Q12H  . thiamine  100 mg Oral Daily   Or  . thiamine (VITAMIN B1) IVPB  100 mg Intravenous Daily  . Vitamin D (Ergocalciferol)  50,000 Units Oral Q7 days    Continuous Inpatient Infusions:     PRN Inpatient Medications:  [DISCONTINUED] acetaminophen **OR** acetaminophen, gabapentin, labetalol, liver oil-zinc oxide, oxyCODONE, pregabalin, QUEtiapine    Physical Examination: BP 110/58 mmHg  Pulse 92  Temp(Src) 97.7 F (36.5 C) (Oral)  Resp 20  Ht 6' (1.829 m)  Wt 210 lb (95.255 kg)  BMI 28.47 kg/m2  SpO2 100% Gen: NAD, alert and oriented x 4 HEENT: PEERLA, EOMI, Neck: supple, no JVD or thyromegaly Chest: CTA bilaterally, no wheezes, crackles, or other adventitious sounds CV: RRR, no m/g/c/r Abd: + obese, soft, mild diffuse TTP Rectal: brown semi-liquidy stool  Data: Lab Results  Component Value Date   WBC 8.5 06/12/2015   HGB 7.7* 06/12/2015   HCT 24.1* 06/12/2015   MCV 104.7* 06/12/2015   PLT 94* 06/12/2015    Recent Labs Lab 06/10/15 0816 06/11/15 1422 06/12/15 0619  HGB 8.7* 7.7* 7.7*   Lab Results  Component Value Date   NA 142 06/14/2015   K 3.7 06/14/2015   CL 119* 06/14/2015   CO2 17* 06/14/2015   BUN 9 06/14/2015   CREATININE 1.10 06/14/2015   Lab Results  Component Value Date   ALT 11*  06/08/2015   AST 41 06/08/2015   ALKPHOS 201* 06/08/2015   BILITOT 1.2 06/08/2015    Recent Labs Lab 06/12/15 0619  INR 1.37   Assessment/Plan: Mr. Bachtel is a 44 y.o. male with c.diff diarrhea.  Improving on dificid and cholestyramine.  Latest c.diff stoll study was negative for toxin and antigen.  Likely resolving c.ciff diarrhea.  Incontinence makes it more dificult to asses clinical improvement.   Recommendations: - cbc, inr in am - cont dificid, cholestyramine for now - d/c IV flagyl  Please call with questions or concerns.  REIN, Grace Blight, MD

## 2015-06-14 NOTE — Progress Notes (Addendum)
[place] at [place] NAME: Troy [name]    MR#:  [id number]  DATE OF BIRTH:  Sep 07, 1971  SUBJECTIVE:  CHIEF COMPLAINT:   Chief Complaint  Patient presents with  . Diarrhea   better diarrhea, but 5 times watery diarrhea today. No abd pain. Afebrile. Better oral intake and weakness.  REVIEW OF SYSTEMS:    Review of Systems  Constitutional: Positive for malaise/fatigue. Negative for fever and diaphoresis.  HENT: Negative for sore throat.   Eyes: Negative for blurred vision, double vision and pain.  Respiratory: Negative for cough, hemoptysis, shortness of breath and wheezing.   Cardiovascular: Negative for chest pain, palpitations, orthopnea and leg swelling.  Gastrointestinal: Positive for diarrhea. Negative for heartburn, nausea, vomiting, abdominal pain and constipation.  Genitourinary: Negative for dysuria and hematuria.  Musculoskeletal: Positive for myalgias. Negative for back pain and joint pain.  Skin: Negative for rash.  Neurological: Positive for weakness. Negative for sensory change, speech change, focal weakness and headaches.  Endo/Heme/Allergies: Does not bruise/bleed easily.  Psychiatric/Behavioral: Negative for depression. The patient is not nervous/anxious.     DRUG ALLERGIES:  No Known Allergies  VITALS:  Blood pressure 110/58, pulse 92, temperature 97.7 F (36.5 C), temperature source Oral, resp. rate 20, height 6' (1.829 m), weight 95.255 kg (210 lb), SpO2 100 %.  PHYSICAL EXAMINATION:   Physical Exam  GENERAL:  44 y.o.-year-old patient lying in the bed EYES: Pupils equal, round, reactive to light and accommodation. No scleral icterus. Extraocular muscles intact.  HEENT: Head atraumatic, normocephalic. Oropharynx and nasopharynx clear.  NECK:  Supple, no jugular venous distention. No thyroid enlargement, no tenderness.  LUNGS: Normal breath sounds bilaterally, no wheezing, rales, rhonchi. No use of  accessory muscles of respiration.  CARDIOVASCULAR: S1, S2 normal. No murmurs, rubs, or gallops.  ABDOMEN: Soft, nontenderness, nondistended. Bowel sounds present. No organomegaly or mass.  EXTREMITIES: No cyanosis, clubbing. No edema bilateral LE  NEUROLOGIC: Cranial nerves II through XII are intact. No focal Motor or sensory deficits b/l.   PSYCHIATRIC: The patient is awake and alert. Oriented times 3 SKIN: No obvious rash, lesion, or ulcer.   LABORATORY PANEL:   CBC  Recent Labs Lab 06/12/15 0619 06/12/15 1040  WBC 8.5  --   HGB 7.7*  --   HCT 24.1*  --   PLT PLATELET CLUMPS NOTED ON SMEAR, UNABLE TO ESTIMATE 94*   ------------------------------------------------------------------------------------------------------------------  Chemistries   Recent Labs Lab 06/08/15 0731  06/09/15 0526  06/14/15 0544  NA 134*  --  135  < > 142  K 3.7  --  3.6  < > 3.7  CL 112*  --  112*  < > 119*  CO2 16*  --  15*  < > 17*  GLUCOSE 93  --  92  < > 89  BUN 9  --  7  < > 9  CREATININE 0.91  --  0.86  < > 1.10  CALCIUM 6.2*  --  6.6*  < > 7.4*  MG  --   < > 1.6*  --   --   AST 41  --   --   --   --   ALT 11*  --   --   --   --   ALKPHOS 201*  --   --   --   --   BILITOT 1.2  --   --   --   --   < > = values  in this interval not displayed. ------------------------------------------------------------------------------------------------------------------  Cardiac Enzymes No results for input(s): TROPONINI in the last 168 hours. ------------------------------------------------------------------------------------------------------------------  RADIOLOGY:  Dg Abd 1 View  06/12/2015   CLINICAL DATA:  Abdominal pain  EXAM: ABDOMEN - 1 VIEW  COMPARISON:  CT abdomen and pelvis February 22, 2014  FINDINGS: There is no bowel dilatation or air-fluid level suggesting obstruction. No free air is seen on this supine examination. 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 There is extensive  avascular necrosis in the right femoral head.  IMPRESSION: Bowel gas pattern unremarkable. No demonstrable obstruction or free air. 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 Extensive avascular necrosis right femoral head.   Electronically Signed   By: Lowella Grip III M.D.   On: 06/12/2015 18:58    ASSESSMENT AND PLAN:   61 m with alcohol abuse, seizures, hepatic encephalopathy here with diarrhea  * C diff colitis.  D/c   IV flagyl.  PO vancomycin was discontinued. Started on deficid 2 days ago. Avoid other abx. Severe diarrhea with slow improvement- repeat c diff  Negative. GI was thinking about stool transplant, but now with negative c diff-  No plan for scope for now per Dr. Rayann Heman.  started on cholestyramine.  * septic and hypovolemic shock - Resolved  * Severe hypokalemia - Resolved  * Alcohol abuse.  on CIWA- no signs of withdrawal.  stopped.  * Acute encephalopathy Due to acute illness Ammonia normal. Lactulose held. On Rifaximin. Resolved now.  * Hem positive blood with anemia of chronic disease - likely from c diff EGD as outpatient per Dr. Rayann Heman.  * Acute renal failure due to ATN- Resolved.  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 follow with a neurologist as outpatient.  All the records are reviewed and case discussed with Care Management/Social Workerr. Management plans discussed with the patient, Dr. Rayann Heman,  and they are in agreement.  CODE STATUS: FULL  DVT Prophylaxis: SCDs. No heparin due to blood in stool  TOTAL TIME TAKING CARE OF THIS PATIENT: 39  minutes.    Demetrios Loll M.D on 06/14/2015 at 3:39 PM  Between 7am to 6pm - Pager - [phone]  After 6pm go to [email or web address] - password [email or web address] [facility]  [place]  Office  [phone]  CC: Primary care physician; No primary care provider on file.

## 2015-06-14 NOTE — Plan of Care (Signed)
Problem: Discharge Progression Outcomes Goal: Other Discharge Outcomes/Goals Outcome: Progressing Pt is alert and oriented x 4, bed bound, incontinent of stool, several loose bm throughout shift, incontinent of urine at times, poor appetite, c/o abdominal pain improved with medications, passing flatus, recceiiving IV antibiotics, stool negative for c. Dif on 6/28, sleeping throughout shift, vital signs remain stable,on room air.

## 2015-06-14 NOTE — Care Management (Signed)
Admitted to [facility] with the diagnosis of sepsis/alcohol abuse, cirrhosis of the liver. States he lives with his wife, Nicki Reaper [phone]). States the last physician he seen was over a year ago and that was Dr. Elisabeth Pigeon. 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 No home Health. Uses a cane, standard walker, and rolator as needed to aid in ambulation. Takes care of all instrumental and [place] activities of daily living himself, still drives. States he is planning on going home when discharged. Physical therapy evaluation completed yesterday. Recommends home with home health. Mr. Troy [name] is not homebound, so does not qualify for home health. Shelbie Ammons RN MSN Care Management [phone]

## 2015-06-14 NOTE — Plan of Care (Signed)
Problem: Discharge Progression Outcomes Goal: Other Discharge Outcomes/Goals Plan of care progress to goals: Afebrile. BP/HR stable. Oxycodone given once for abd pain with good effect. Seroquel given for insomnia, slept well. Desitin used on excoriated bottom. Encouraged pt to use the urinal. Enteric precautions maintained. 1xloose stool tonight.

## 2015-06-15 LAB — CBC
HEMATOCRIT: 18.7 % — AB (ref 40.0–52.0)
Hemoglobin: 6.1 g/dL — ABNORMAL LOW (ref 13.0–18.0)
MCH: 33.5 pg (ref 26.0–34.0)
MCHC: 32.5 g/dL (ref 32.0–36.0)
MCV: 103.3 fL — ABNORMAL HIGH (ref 80.0–100.0)
Platelets: 88 10*3/uL — ABNORMAL LOW (ref 150–440)
RBC: 1.81 MIL/uL — AB (ref 4.40–5.90)
RDW: 16.6 % — ABNORMAL HIGH (ref 11.5–14.5)
WBC: 4.4 10*3/uL (ref 3.8–10.6)

## 2015-06-15 LAB — VITAMIN D 1,25 DIHYDROXY
Vitamin D 1, 25 (OH)2 Total: 35 pg/mL
Vitamin D3 1, 25 (OH)2: 32 pg/mL

## 2015-06-15 LAB — PREPARE RBC (CROSSMATCH)

## 2015-06-15 LAB — TSH: TSH: 10.294 u[IU]/mL — ABNORMAL HIGH (ref 0.350–4.500)

## 2015-06-15 MED ORDER — FUROSEMIDE 10 MG/ML IJ SOLN
20.0000 mg | Freq: Once | INTRAMUSCULAR | Status: AC
  Administered 2015-06-15: 20 mg via INTRAVENOUS
  Filled 2015-06-15: qty 2

## 2015-06-15 MED ORDER — LEVOTHYROXINE SODIUM 25 MCG PO TABS
175.0000 ug | ORAL_TABLET | Freq: Every day | ORAL | Status: DC
  Administered 2015-06-16 – 2015-06-21 (×6): 175 ug via ORAL
  Filled 2015-06-15 (×6): qty 1

## 2015-06-15 MED ORDER — FUROSEMIDE 10 MG/ML IJ SOLN
20.0000 mg | Freq: Once | INTRAMUSCULAR | Status: AC
Start: 2015-06-15 — End: 2015-06-15
  Administered 2015-06-15: 16:00:00 20 mg via INTRAVENOUS
  Filled 2015-06-15: qty 2

## 2015-06-15 MED ORDER — SODIUM CHLORIDE 0.9 % IV SOLN
Freq: Once | INTRAVENOUS | Status: AC
  Administered 2015-06-15: 12:00:00 via INTRAVENOUS

## 2015-06-15 NOTE — Progress Notes (Signed)
GI Inpatient Follow-up Note  Patient Identification: BADEN BETSCH is a 44 y.o. male with c.diff diarrhea  Subjective:  Reports diarrhea unchanged. Still incontinent, mult stools, but smaller amounts.  + Abd pain, no f/c.   Scheduled Inpatient Medications:  . allopurinol  300 mg Oral Daily  . cholestyramine light  4 g Oral TID  . fidaxomicin  200 mg Oral BID  . folic acid  1 mg Oral Daily  . furosemide  20 mg Intravenous Once  . levothyroxine  150 mcg Oral Daily  . metoprolol tartrate  25 mg Oral BID  . pantoprazole  40 mg Oral BID  . potassium chloride  40 mEq Oral BID  . saccharomyces boulardii  250 mg Oral BID  . sodium chloride  3 mL Intravenous Q12H  . thiamine  100 mg Oral Daily   Or  . thiamine (VITAMIN B1) IVPB  100 mg Intravenous Daily  . Vitamin D (Ergocalciferol)  50,000 Units Oral Q7 days    Continuous Inpatient Infusions:     PRN Inpatient Medications:  [DISCONTINUED] acetaminophen **OR** acetaminophen, gabapentin, labetalol, liver oil-zinc oxide, oxyCODONE, pregabalin, QUEtiapine   Physical Examination: BP 94/53 mmHg  Pulse 93  Temp(Src) 98.2 F (36.8 C) (Oral)  Resp 20  Ht 6' (1.829 m)  Wt 95.255 kg (210 lb)  BMI 28.47 kg/m2  SpO2 100% Gen: NAD, alert and oriented x 4, obese Neck: supple, no JVD or thyromegaly Chest: CTA bilaterally, no wheezes, crackles, or other adventitious sounds CV: RRR, no m/g/c/r Abd: obese abd, mild distension, nabs, no r/g Ext: no edema, well perfused with 2+ pulses,  Data: Lab Results  Component Value Date   WBC 4.4 06/15/2015   HGB 6.1* 06/15/2015   HCT 18.7* 06/15/2015   MCV 103.3* 06/15/2015   PLT 88* 06/15/2015    Recent Labs Lab 06/11/15 1422 06/12/15 0619 06/15/15 0557  HGB 7.7* 7.7* 6.1*   Lab Results  Component Value Date   NA 142 06/14/2015   K 3.7 06/14/2015   CL 119* 06/14/2015   CO2 17* 06/14/2015   BUN 9 06/14/2015   CREATININE 1.10 06/14/2015   Lab Results  Component Value Date   ALT  11* 06/08/2015   AST 41 06/08/2015   ALKPHOS 201* 06/08/2015   BILITOT 1.2 06/08/2015    Recent Labs Lab 06/14/15 1736  INR 1.31   Assessment/Plan:  Mr. Cothran is a 44 y.o. male with c.diff diarrhea.  Possible mild improvement on dificid and cholestyramine but still incontinent and loose stools. Hgb down today to 6.1 but no blood in stool, susepct this was in part spurious. C.diff studies neg on latest sample.   Recommendations: - cont dificid 200 BID - cont cholestyramine - possible stool transplant on Tuesday if no improvement over weekend.  - possible EGD in addition to colon if Hgb drops again.   Please call with questions or concerns.  REIN, Grace Blight, MD

## 2015-06-15 NOTE — Progress Notes (Signed)
PT Cancellation Note  Patient Details Name: Troy [name] MRN: [id number] DOB: Jul 11, 1971   Cancelled Treatment:    Reason Eval/Treat Not Completed: Medical issues which prohibited therapy (Pt's Hg 6.1.)    Hemoglobin <7.1 PT contraindicated.  Will re-attempt PT treatment session at a later date/time as medically appropriate/as able.   [name] 06/15/2015, 8:31 AM  [name], [place]

## 2015-06-15 NOTE — Progress Notes (Signed)
Notified Dr Marcille Blanco of Hgb 6.1. No new orders at this time. Will notify day RN to follow up.

## 2015-06-15 NOTE — Plan of Care (Signed)
Problem: Discharge Progression Outcomes Goal: Other Discharge Outcomes/Goals Plan of care progress to goals: VSS. Oxycodone given for abd pain with improvement. No c/o n/v. Seroquel given for insomnia, slept well. Enteric precautions maintained, still having loose stool . Desitin applied to excoriated bottom.

## 2015-06-15 NOTE — Plan of Care (Signed)
Problem: Discharge Progression Outcomes Goal: Other Discharge Outcomes/Goals Outcome: Progressing Plan of care progress to goals: VSS. Oxycodone given for abd pain with improvement. No c/o n/v.  Enteric precautions maintained, still having loose stool . Desitin applied to excoriated bottom. 1 unit of blood infused without complications, 2nd unit infusing now.VSS.

## 2015-06-15 NOTE — Progress Notes (Signed)
[facility] at [place] NAME: Troy [name]    MR#:  [id number]  DATE OF BIRTH:  [birth year]  SUBJECTIVE:  CHIEF COMPLAINT:   Chief Complaint  Patient presents with  . Diarrhea   watery diarrhea result male in no bloody stool 8 times today. Same weakness. Good appetite.  REVIEW OF SYSTEMS:    Review of Systems  Constitutional: Positive for malaise/fatigue. Negative for fever and diaphoresis.  HENT: Negative for sore throat.   Eyes: Negative for blurred vision, double vision and pain.  Respiratory: Negative for cough, hemoptysis, shortness of breath and wheezing.   Cardiovascular: Negative for chest pain, palpitations, orthopnea and leg swelling.  Gastrointestinal: Positive for diarrhea. Negative for heartburn, nausea, vomiting, abdominal pain and constipation.  Genitourinary: Negative for dysuria and hematuria.  Musculoskeletal: Negative for myalgias, back pain and joint pain.  Skin: Negative for rash.  Neurological: Positive for weakness. Negative for sensory change, speech change, focal weakness and headaches.  Endo/Heme/Allergies: Does not bruise/bleed easily.  Psychiatric/Behavioral: Negative for depression. The patient is not nervous/anxious.     DRUG ALLERGIES:  No Known Allergies  VITALS:  Blood pressure 94/53, pulse 93, temperature 98.2 F (36.8 C), temperature source Oral, resp. rate 20, height 6' (1.829 m), weight 95.255 kg (210 lb), SpO2 100 %.  PHYSICAL EXAMINATION:   Physical Exam  GENERAL:  44 y.o.-year-old patient lying in the bed EYES: Pupils equal, round, reactive to light and accommodation. No scleral icterus. Extraocular muscles intact.  HEENT: Head atraumatic, normocephalic. Oropharynx and nasopharynx clear.  NECK:  Supple, no jugular venous distention. No thyroid enlargement, no tenderness.  LUNGS: Normal breath sounds bilaterally, no wheezing, rales, rhonchi. No use of accessory muscles of respiration.   CARDIOVASCULAR: S1, S2 normal. No murmurs, rubs, or gallops.  ABDOMEN: Soft, nontenderness, nondistended. Bowel sounds present. No organomegaly or mass.  EXTREMITIES: No cyanosis, clubbing. No edema bilateral LE  NEUROLOGIC: Cranial nerves II through XII are intact. No focal Motor or sensory deficits b/l.   PSYCHIATRIC: The patient is awake and alert. Oriented times 3 SKIN: No obvious rash, lesion, or ulcer.   LABORATORY PANEL:   CBC  Recent Labs Lab 06/15/15 0557  WBC 4.4  HGB 6.1*  HCT 18.7*  PLT 88*   ------------------------------------------------------------------------------------------------------------------  Chemistries   Recent Labs Lab 06/09/15 0526  06/14/15 0544  NA 135  < > 142  K 3.6  < > 3.7  CL 112*  < > 119*  CO2 15*  < > 17*  GLUCOSE 92  < > 89  BUN 7  < > 9  CREATININE 0.86  < > 1.10  CALCIUM 6.6*  < > 7.4*  MG 1.6*  --   --   < > = values in this interval not displayed. ------------------------------------------------------------------------------------------------------------------  Cardiac Enzymes No results for input(s): TROPONINI in the last 168 hours. ------------------------------------------------------------------------------------------------------------------  RADIOLOGY:  No results found.  ASSESSMENT AND PLAN:   55 m with alcohol abuse, seizures, hepatic encephalopathy here with diarrhea  * C diff colitis.  IV flagyl and PO vancomycin was discontinued. Started on deficid 3 days ago.  started on cholestyramine. Avoid other abx. Still diarrhea with slow improvement- repeat c diff  Negative.  continue current treatment, possible stool transplant and endoscopy this coming Tuesday per Dr. Rayann Heman.   * septic and hypovolemic shock - Resolved  * Severe hypokalemia - Resolved  * Alcohol abuse.  on CIWA- no signs of withdrawal.  stopped.  *  Acute encephalopathy Due to acute illness Ammonia normal. Lactulose held. On  Rifaximin. Resolved now.  * Hem positive blood with anemia of chronic disease - likely from c diff Hemoglobin decreased to 6.1 today, he is getting 2 units of PRBC transfusion now. Follow-up hemoglobin and possible endoscopy in 3 days per Dr. Rayann Heman.  * Acute renal failure due to ATN- Resolved.  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 follow with a neurologist as outpatient.  All the records are reviewed and case discussed with Care Management/Social Workerr. Management plans discussed with the patient, Dr. Rayann Heman,  and they are in agreement.  CODE STATUS: FULL  DVT Prophylaxis: SCDs. No heparin due to blood in stool  TOTAL TIME TAKING CARE OF THIS PATIENT: 38  minutes.    Troy [name] M.D on 06/15/2015 at 2:05 PM  Between 7am to 6pm - Pager - [phone]  After 6pm go to [email or web address] - password [email or web address] [facility]  [facility]  Office  [phone]  CC: Primary care physician; No primary care provider on file.

## 2015-06-15 NOTE — Progress Notes (Addendum)
Nutrition Follow-up  INTERVENTION:  Meals and Snacks: Cater to patient preferences Medical Food Supplement Therapy: will recommend continuing as ordered    NUTRITION DIAGNOSIS:  Inadequate oral intake related to altered GI function as evidenced by  (current diet order, no solids since admission); improved with diet advancement  GOAL:  Patient will meet greater than or equal to 90% of their needs; ongoing  MONITOR:   (Energy Intake, digestive system, Anthropometrics)    ASSESSMENT:  Per MD note, secondary to c.diff negative on repeat, will not pursue fecal fat testing at this time but possibly next week if no improvement. Pt with loose stools today. Pt unavailable times 2 on visit today.  Diet Order:  Diet regular Room service appropriate?: Yes; Fluid consistency:: Thin  Current Nutrition: Per Nsg, pt is eating better today. Limited documentation per I/O chart as pt on isolation however 65% of breakfast and bites/sips at lunch yesterday.  Gastrointestinal Profile: Last BM: today loose stools   Medications: Folic acid, Protonix, KCl, thiamine, vitamin D  Electrolyte/Renal Profile and Glucose Profile:   Recent Labs Lab 06/09/15 0526 06/10/15 0816 06/14/15 0544  NA 135 138 142  K 3.6 4.2 3.7  CL 112* 116* 119*  CO2 15* 17* 17*  BUN 7 5* 9  CREATININE 0.86 0.90 1.10  CALCIUM 6.6* 7.1* 7.4*  MG 1.6*  --   --   PHOS 2.9  --   --   GLUCOSE 92 91 89   Protein Profile: No results for input(s): ALBUMIN in the last 168 hours.  Nutritional Anemia Profile:  CBC Latest Ref Rng 06/15/2015 06/12/2015 06/12/2015  WBC 3.8 - 10.6 K/uL 4.4 - 8.5  Hemoglobin 13.0 - 18.0 g/dL 6.1(L) - 7.7(L)  Hematocrit 40.0 - 52.0 % 18.7(L) - 24.1(L)  Platelets 150 - 440 K/uL 88(L) 94(L) PLATELET CLUMPS NOTED ON SMEAR, UNABLE TO ESTIMATE    Weight Trend since Admission: Filed Weights   06/06/15 1555  Weight: 210 lb (95.255 kg)    BMI:  Body mass index is 28.47 kg/(m^2).  Estimated  Nutritional Needs:  Kcal:  2286-2701kcals, BEE: 1731kcals, TEE: (IF 1.1-1.3)(AF 1.2) using IBW of 80.9kg  Protein:  80-97g protein (1.0-1.2g/kg) using IBW of 80.9kg  Fluid:  2023-2481mL of fluid (25-71mL/kg) using IBW of 80.9kg  EDUCATION NEEDS:  No education needs identified at this time   [place], RD, LDN Pager [phone]

## 2015-06-16 LAB — BASIC METABOLIC PANEL
ANION GAP: 5 (ref 5–15)
BUN: 10 mg/dL (ref 6–20)
CO2: 19 mmol/L — ABNORMAL LOW (ref 22–32)
CREATININE: 0.92 mg/dL (ref 0.61–1.24)
Calcium: 6.9 mg/dL — ABNORMAL LOW (ref 8.9–10.3)
Chloride: 114 mmol/L — ABNORMAL HIGH (ref 101–111)
GFR calc non Af Amer: >60 mL/min (ref >60)
GLUCOSE: 86 mg/dL (ref 65–99)
POTASSIUM: 3.2 mmol/L — AB (ref 3.5–5.1)
Sodium: 138 mmol/L (ref 135–145)

## 2015-06-16 LAB — TYPE AND SCREEN
ABO/RH(D): O POS
Antibody Screen: NEGATIVE
UNIT DIVISION: 0
UNIT DIVISION: 0

## 2015-06-16 LAB — CBC
HCT: 24.5 % — ABNORMAL LOW (ref 40.0–52.0)
Hemoglobin: 8.1 g/dL — ABNORMAL LOW (ref 13.0–18.0)
MCH: 32.4 pg (ref 26.0–34.0)
MCHC: 33.1 g/dL (ref 32.0–36.0)
MCV: 97.9 fL (ref 80.0–100.0)
PLATELETS: 124 10*3/uL — AB (ref 150–440)
RBC: 2.5 MIL/uL — AB (ref 4.40–5.90)
RDW: 18.8 % — AB (ref 11.5–14.5)
WBC: 5.7 10*3/uL (ref 3.8–10.6)

## 2015-06-16 LAB — MAGNESIUM: Magnesium: 1 mg/dL — ABNORMAL LOW (ref 1.7–2.4)

## 2015-06-16 MED ORDER — POTASSIUM CHLORIDE CRYS ER 20 MEQ PO TBCR
40.0000 meq | EXTENDED_RELEASE_TABLET | Freq: Once | ORAL | Status: AC
  Administered 2015-06-16: 40 meq via ORAL
  Filled 2015-06-16: qty 2

## 2015-06-16 MED ORDER — MAGNESIUM SULFATE 4 GM/100ML IV SOLN
4.0000 g | Freq: Once | INTRAVENOUS | Status: AC
  Administered 2015-06-16: 4 g via INTRAVENOUS
  Filled 2015-06-16: qty 100

## 2015-06-16 NOTE — Progress Notes (Signed)
Troy [name] at [place] NAME: Troy [name]    MR#:  [id number]  DATE OF BIRTH:  1971-04-09  SUBJECTIVE:  CHIEF COMPLAINT:   Chief Complaint  Patient presents with  . Diarrhea   watery diarrhea multiple times last night. One episode this am. No weakness. Good appetite.  REVIEW OF SYSTEMS:    Review of Systems  Constitutional: Negative for fever, malaise/fatigue and diaphoresis.  HENT: Negative for sore throat.   Eyes: Negative for blurred vision, double vision and pain.  Respiratory: Negative for cough, hemoptysis, shortness of breath and wheezing.   Cardiovascular: Negative for chest pain, palpitations, orthopnea and leg swelling.  Gastrointestinal: Positive for diarrhea. Negative for heartburn, nausea, vomiting, abdominal pain and constipation.  Genitourinary: Negative for dysuria and hematuria.  Musculoskeletal: Negative for myalgias, back pain and joint pain.  Skin: Negative for rash.  Neurological: Negative for sensory change, speech change, focal weakness, weakness and headaches.  Endo/Heme/Allergies: Does not bruise/bleed easily.  Psychiatric/Behavioral: Negative for depression. The patient is not nervous/anxious.     DRUG ALLERGIES:  No Known Allergies  VITALS:  Blood pressure 108/63, pulse 103, temperature 98.2 F (36.8 C), temperature source Oral, resp. rate 20, height 6' (1.829 m), weight 95.255 kg (210 lb), SpO2 98 %.  PHYSICAL EXAMINATION:   Physical Exam  GENERAL:  44 y.o.-year-old patient lying in the bed EYES: Pupils equal, round, reactive to light and accommodation. No scleral icterus. Extraocular muscles intact.  HEENT: Head atraumatic, normocephalic. Oropharynx and nasopharynx clear.  NECK:  Supple, no jugular venous distention. No thyroid enlargement, no tenderness.  LUNGS: Normal breath sounds bilaterally, no wheezing, rales, rhonchi. No use of accessory muscles of respiration.  CARDIOVASCULAR: S1, S2  normal. No murmurs, rubs, or gallops.  ABDOMEN: Soft, nontenderness, nondistended. Bowel sounds present. No organomegaly or mass.  EXTREMITIES: No cyanosis, clubbing. No edema bilateral LE  NEUROLOGIC: Cranial nerves II through XII are intact. No focal Motor or sensory deficits b/l.   PSYCHIATRIC: The patient is awake and alert. Oriented times 3 SKIN: No obvious rash, lesion, or ulcer.   LABORATORY PANEL:   CBC  Recent Labs Lab 06/16/15 0455  WBC 5.7  HGB 8.1*  HCT 24.5*  PLT 124*   ------------------------------------------------------------------------------------------------------------------  Chemistries   Recent Labs Lab 06/16/15 0455  NA 138  K 3.2*  CL 114*  CO2 19*  GLUCOSE 86  BUN 10  CREATININE 0.92  CALCIUM 6.9*  MG 1.0*   ------------------------------------------------------------------------------------------------------------------  Cardiac Enzymes No results for input(s): TROPONINI in the last 168 hours. ------------------------------------------------------------------------------------------------------------------  RADIOLOGY:  No results found.  ASSESSMENT AND PLAN:   6 m with alcohol abuse, seizures, hepatic encephalopathy here with diarrhea  * C diff colitis.  IV flagyl and PO vancomycin was discontinued. Continue deficid (4 days so far) and cholestyramine. Avoid other abx. repeat c diff  Negative. possible stool transplant and endoscopy this coming Tuesday per Dr. Rayann Heman.   * septic and hypovolemic shock - Resolved  * Hypokalemia - continue KCl.   * Hypomagnesemia. Mag iv, f/u level.  * Alcohol abuse.  on CIWA- no signs of withdrawal.  stopped.  * Acute encephalopathy,  Due to acute illness. Resolved.  On Rifaximin.  * Hem positive blood with anemia of chronic disease - likely from c diff Hemoglobin decreased to 6.1 yesterday, got 2 units of PRBC transfusion, hemoglobin 8.1.  possible endoscopy this coming week per Dr.  Rayann Heman.  * Acute renal failure due to ATN-  Resolved.  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 follow with a neurologist as outpatient.  All the records are reviewed and case discussed with Care Management/Social Workerr. Management plans discussed with the patient, Dr. Rayann Heman,  and they are in agreement.  CODE STATUS: FULL  DVT Prophylaxis: SCDs. No heparin due to blood in stool  TOTAL TIME TAKING CARE OF THIS PATIENT: 37  minutes.    Demetrios Loll M.D on 06/16/2015 at 1:02 PM  Between 7am to 6pm - Pager - [phone]  After 6pm go to [email or web address] - password [email or web address] [facility]  [facility]  Office  [phone]  CC: Primary care physician; No primary care provider on file.

## 2015-06-16 NOTE — Plan of Care (Signed)
Problem: Discharge Progression Outcomes Goal: Other Discharge Outcomes/Goals Outcome: Progressing Plan of care progress to goals: VSS. Oxycodone given for  pain with improvement. Tolerating diet, no c/o n/v.  Enteric precautions maintained, still having loose stool . Desitin applied to excoriated bottom.

## 2015-06-16 NOTE — Plan of Care (Addendum)
Problem: Discharge Progression Outcomes Goal: Other Discharge Outcomes/Goals Outcome: Progressing Plan of care progress to goals: Discharge plan- Pain-pt c/o perineum pain, improved with PRN pain medication. Hemodynamically stable- hgb 6.1, pt was transfused with 2 units pRBC's, awaiting AM lab results.  Complications resolved/controlled- pt's perineum/groin/buttocks are extremely excoriated, attempted to allow warm clean water run over perineum instead of wiping with wipes and patient requesting personal fan in hopes to reduce moisture to perineum area.  Activity appropriate-pt states that he gets very exerted and short of breath when getting OOB up to [facility], which contributes to stool incontinence. A&O patient, c/o perineum/scrotal/butttock pain d/t excoriation improves with PRN pain medication. Pt c/o insomnia, Seroquel given per PRN order with good effect noted. Pt completed 2nd unit of pRBC's, no signs or symptoms of reaction noted. VSS. Pt voiding clear yellow urine, using urinal. Incontinent of bowels. Edema noted to BL lower ext. Pt remains on RA, maintaining oxygen saturations at 100%.

## 2015-06-16 NOTE — Consult Note (Signed)
Subjective: Patient seen for Clostridium difficile infection. Asian is feeling better than he did a couple of days ago. States he has some generalized abdominal discomfort but denies nausea or vomiting. He had 1 bowel movement earlier this morning and has been at least 6 hours since his last one. This is an improvement. Currently taking dificid 200 mg twice daily.  Objective: Vital signs in last 24 hours: Temp:  [97.8 F (36.6 C)-98.3 F (36.8 C)] 98.2 F (36.8 C) (07/02 0615) Pulse Rate:  [96-103] 103 (07/02 0615) Resp:  [20] 20 (07/01 2015) BP: (101-119)/(61-71) 108/63 mmHg (07/02 0909) SpO2:  [98 %-100 %] 98 % (07/02 0909) Blood pressure 108/63, pulse 103, temperature 98.2 F (36.8 C), temperature source Oral, resp. rate 20, height 6' (1.829 m), weight 95.255 kg (210 lb), SpO2 98 %.   Intake/Output from previous day: 07/01 0701 - 07/02 0700 In: 658 [Blood:658] Out: 1800 [Urine:1800]  Intake/Output this shift:     General appearance:  A 44 year old male no acute distress Resp: Clear to auscultation Cardio:  Regular rate and rhythm without rub or gallop GI:  Please/protuberant. Mild fluid wave. Multiple veins noted over the abdominal surface. He is nontender. There are no masses or rebound. Perhaps a very mild peritoneal sign. Extremities:  1+ to 2+ lower extremity edema with evidence of peripheral vascular disease.   Lab Results: Results for orders placed or performed during the hospital encounter of 06/06/15 (from the past 24 hour(s))  Basic metabolic panel     Status: Abnormal   Collection Time: 06/16/15  4:55 AM  Result Value Ref Range   Sodium 138 135 - 145 mmol/L   Potassium 3.2 (L) 3.5 - 5.1 mmol/L   Chloride 114 (H) 101 - 111 mmol/L   CO2 19 (L) 22 - 32 mmol/L   Glucose, Bld 86 65 - 99 mg/dL   BUN 10 6 - 20 mg/dL   Creatinine, Ser 0.92 0.61 - 1.24 mg/dL   Calcium 6.9 (L) 8.9 - 10.3 mg/dL   GFR calc non Af Amer >60 >60 mL/min   GFR calc Af Amer >60 >60 mL/min   Anion gap 5 5 - 15  CBC     Status: Abnormal   Collection Time: 06/16/15  4:55 AM  Result Value Ref Range   WBC 5.7 3.8 - 10.6 K/uL   RBC 2.50 (L) 4.40 - 5.90 MIL/uL   Hemoglobin 8.1 (L) 13.0 - 18.0 g/dL   HCT 24.5 (L) 40.0 - 52.0 %   MCV 97.9 80.0 - 100.0 fL   MCH 32.4 26.0 - 34.0 pg   MCHC 33.1 32.0 - 36.0 g/dL   RDW 18.8 (H) 11.5 - 14.5 %   Platelets 124 (L) 150 - 440 K/uL  Magnesium     Status: Abnormal   Collection Time: 06/16/15  4:55 AM  Result Value Ref Range   Magnesium 1.0 (L) 1.7 - 2.4 mg/dL      Recent Labs  06/15/15 0557 06/16/15 0455  WBC 4.4 5.7  HGB 6.1* 8.1*  HCT 18.7* 24.5*  PLT 88* 124*   BMET  Recent Labs  06/14/15 0544 06/16/15 0455  NA 142 138  K 3.7 3.2*  CL 119* 114*  CO2 17* 19*  GLUCOSE 89 86  BUN 9 10  CREATININE 1.10 0.92  CALCIUM 7.4* 6.9*   LFT No results for input(s): PROT, ALBUMIN, AST, ALT, ALKPHOS, BILITOT, BILIDIR, IBILI in the last 72 hours. PT/INR  Recent Labs  06/14/15 1736  LABPROT 16.5*  INR 1.31   Hepatitis Panel No results for input(s): HEPBSAG, HCVAB, HEPAIGM, HEPBIGM in the last 72 hours. C-Diff No results for input(s): CDIFFTOX in the last 72 hours. No results for input(s): CDIFFPCR in the last 72 hours.   Studies/Results: No results found.  Scheduled Inpatient Medications:   . allopurinol  300 mg Oral Daily  . cholestyramine light  4 g Oral TID  . fidaxomicin  200 mg Oral BID  . folic acid  1 mg Oral Daily  . levothyroxine  175 mcg Oral QAC breakfast  . metoprolol tartrate  25 mg Oral BID  . pantoprazole  40 mg Oral BID  . potassium chloride  40 mEq Oral BID  . saccharomyces boulardii  250 mg Oral BID  . sodium chloride  3 mL Intravenous Q12H  . thiamine  100 mg Oral Daily   Or  . thiamine (VITAMIN B1) IVPB  100 mg Intravenous Daily  . Vitamin D (Ergocalciferol)  50,000 Units Oral Q7 days    Continuous Inpatient Infusions:     PRN Inpatient Medications:  [DISCONTINUED] acetaminophen  **OR** acetaminophen, gabapentin, labetalol, liver oil-zinc oxide, oxyCODONE, pregabalin, QUEtiapine  Miscellaneous:   Assessment:  1. Clostridium difficile slow improvement currently on Dificid 2. History of alcohol abuse, ongoing  Plan:  1. Continue current medications. Depending on our he does over the next couple of days he may or may not need colonoscopy and stool transplant. Will follow with you  Lollie Sails MD 06/16/2015, 2:45 PM

## 2015-06-17 LAB — MAGNESIUM: Magnesium: 1.3 mg/dL — ABNORMAL LOW (ref 1.7–2.4)

## 2015-06-17 LAB — BASIC METABOLIC PANEL
ANION GAP: 5 (ref 5–15)
BUN: 9 mg/dL (ref 6–20)
CALCIUM: 7.6 mg/dL — AB (ref 8.9–10.3)
CO2: 18 mmol/L — AB (ref 22–32)
CREATININE: 0.8 mg/dL (ref 0.61–1.24)
Chloride: 116 mmol/L — ABNORMAL HIGH (ref 101–111)
GFR calc non Af Amer: >60 mL/min (ref >60)
GLUCOSE: 119 mg/dL — AB (ref 65–99)
Potassium: 3.6 mmol/L (ref 3.5–5.1)
Sodium: 139 mmol/L (ref 135–145)

## 2015-06-17 LAB — CBC
HCT: 23.5 % — ABNORMAL LOW (ref 40.0–52.0)
Hemoglobin: 7.8 g/dL — ABNORMAL LOW (ref 13.0–18.0)
MCH: 32.7 pg (ref 26.0–34.0)
MCHC: 33.1 g/dL (ref 32.0–36.0)
MCV: 98.8 fL (ref 80.0–100.0)
Platelets: 138 10*3/uL — ABNORMAL LOW (ref 150–440)
RBC: 2.38 MIL/uL — ABNORMAL LOW (ref 4.40–5.90)
RDW: 18.3 % — ABNORMAL HIGH (ref 11.5–14.5)
WBC: 5.2 10*3/uL (ref 3.8–10.6)

## 2015-06-17 LAB — OCCULT BLOOD X 1 CARD TO LAB, STOOL: Fecal Occult Bld: POSITIVE — AB

## 2015-06-17 MED ORDER — MAGNESIUM SULFATE 4 GM/100ML IV SOLN
4.0000 g | Freq: Once | INTRAVENOUS | Status: AC
  Administered 2015-06-17: 09:00:00 4 g via INTRAVENOUS
  Filled 2015-06-17: qty 100

## 2015-06-17 NOTE — Plan of Care (Signed)
Problem: Discharge Progression Outcomes Goal: Other Discharge Outcomes/Goals Outcome: Progressing Plan of care progress to goals: VSS. Oxycodone given for  pain with improvement. Tolerating diet, no c/o n/v.  Enteric precautions maintained,now having bloody stools with clots . Desitin applied to excoriated bottom.

## 2015-06-17 NOTE — Progress Notes (Signed)
Patient with known C. difficile followed by gastroenterology Informed by nursing staff patient now passed blood clot via stool Hemodynamically stable continue to follow CBC trend

## 2015-06-17 NOTE — Progress Notes (Signed)
Called to patients room by NT noted bright red rectal bleeding with clots present, spoke to Dr Lavetta Nielsen since this is new for patient, pt has been anemic with no source, occult stool ordered will pass on to am rn

## 2015-06-17 NOTE — Consult Note (Signed)
Subjective: Patient seen for C. difficile, diarrhea. She had 4 bowel movements yesterday recorded however none recorded yet today. There is some lower abdominal discomfort and some mild epigastric discomfort however this is a 4-6 out of 10, improving. Patient is tolerating soft foods.  Objective: Vital signs in last 24 hours: Temp:  [97.8 F (36.6 C)-98.2 F (36.8 C)] 97.8 F (36.6 C) (07/03 1252) Pulse Rate:  [91-118] 91 (07/03 1252) Resp:  [18-20] 20 (07/03 1252) BP: (99-120)/(57-83) 110/64 mmHg (07/03 1252) SpO2:  [100 %] 100 % (07/03 1252) Blood pressure 110/64, pulse 91, temperature 97.8 F (36.6 C), temperature source Oral, resp. rate 20, height 6' (1.829 m), weight 95.255 kg (210 lb), SpO2 100 %.   Intake/Output from previous day: 07/02 0701 - 07/03 0700 In: -  Out: 2700 [Urine:2700]  Intake/Output this shift: Total I/O In: 103 [I.V.:3; IV Piggyback:100] Out: -    General appearance:  A 44 year old male no acute distress Resp:  There to auscultation Cardio:  Regular rate and rhythm GI:  Obese minimal discomfort palpation the lower abdomen and upper epigastric region. Unable to palpate internal organs. Bowel sounds are positive normoactive. Extremities:     Lab Results: Results for orders placed or performed during the hospital encounter of 06/06/15 (from the past 24 hour(s))  Occult blood card to lab, stool RN will collect     Status: Abnormal   Collection Time: 06/17/15  2:55 AM  Result Value Ref Range   Fecal Occult Bld POSITIVE (A) NEGATIVE  Magnesium     Status: Abnormal   Collection Time: 06/17/15  5:06 AM  Result Value Ref Range   Magnesium 1.3 (L) 1.7 - 2.4 mg/dL  Basic metabolic panel     Status: Abnormal   Collection Time: 06/17/15  5:06 AM  Result Value Ref Range   Sodium 139 135 - 145 mmol/L   Potassium 3.6 3.5 - 5.1 mmol/L   Chloride 116 (H) 101 - 111 mmol/L   CO2 18 (L) 22 - 32 mmol/L   Glucose, Bld 119 (H) 65 - 99 mg/dL   BUN 9 6 - 20 mg/dL   Creatinine, Ser 0.80 0.61 - 1.24 mg/dL   Calcium 7.6 (L) 8.9 - 10.3 mg/dL   GFR calc non Af Amer >60 >60 mL/min   GFR calc Af Amer >60 >60 mL/min   Anion gap 5 5 - 15  CBC     Status: Abnormal   Collection Time: 06/17/15  5:06 AM  Result Value Ref Range   WBC 5.2 3.8 - 10.6 K/uL   RBC 2.38 (L) 4.40 - 5.90 MIL/uL   Hemoglobin 7.8 (L) 13.0 - 18.0 g/dL   HCT 23.5 (L) 40.0 - 52.0 %   MCV 98.8 80.0 - 100.0 fL   MCH 32.7 26.0 - 34.0 pg   MCHC 33.1 32.0 - 36.0 g/dL   RDW 18.3 (H) 11.5 - 14.5 %   Platelets 138 (L) 150 - 440 K/uL      Recent Labs  06/15/15 0557 06/16/15 0455 06/17/15 0506  WBC 4.4 5.7 5.2  HGB 6.1* 8.1* 7.8*  HCT 18.7* 24.5* 23.5*  PLT 88* 124* 138*   BMET  Recent Labs  06/16/15 0455 06/17/15 0506  NA 138 139  K 3.2* 3.6  CL 114* 116*  CO2 19* 18*  GLUCOSE 86 119*  BUN 10 9  CREATININE 0.92 0.80  CALCIUM 6.9* 7.6*   LFT No results for input(s): PROT, ALBUMIN, AST, ALT, ALKPHOS, BILITOT, BILIDIR, IBILI in  the last 72 hours. PT/INR  Recent Labs  06/14/15 1736  LABPROT 16.5*  INR 1.31   Hepatitis Panel No results for input(s): HEPBSAG, HCVAB, HEPAIGM, HEPBIGM in the last 72 hours. C-Diff No results for input(s): CDIFFTOX in the last 72 hours. No results for input(s): CDIFFPCR in the last 72 hours.   Studies/Results: No results found.  Scheduled Inpatient Medications:   . allopurinol  300 mg Oral Daily  . cholestyramine light  4 g Oral TID  . fidaxomicin  200 mg Oral BID  . folic acid  1 mg Oral Daily  . levothyroxine  175 mcg Oral QAC breakfast  . metoprolol tartrate  25 mg Oral BID  . pantoprazole  40 mg Oral BID  . potassium chloride  40 mEq Oral BID  . saccharomyces boulardii  250 mg Oral BID  . sodium chloride  3 mL Intravenous Q12H  . thiamine  100 mg Oral Daily  . Vitamin D (Ergocalciferol)  50,000 Units Oral Q7 days    Continuous Inpatient Infusions:     PRN Inpatient Medications:  [DISCONTINUED] acetaminophen **OR**  acetaminophen, gabapentin, labetalol, liver oil-zinc oxide, oxyCODONE, pregabalin, QUEtiapine  Miscellaneous:   Assessment:  1. C. difficile colitis. Stable. Probable slow improvement. Currently on Dificid. 2. Epigastric discomfort- on PPI  Plan:  1. Continue current. Patient is very fearful in terms of having a stool accident. I have tried to reassure him that we need to obtain the stool as ordered and that if there is a problem will be taken care of. Will see how he is doing by Tuesday if his anti-biotic treatment. Will start some florastor.  Lollie Sails MD 06/17/2015, 1:07 PM

## 2015-06-17 NOTE — Progress Notes (Signed)
[facility]roy [name] at [place] NAME: Troy [name]    MR#:  [id number]  DATE OF BIRTH:  Sep 23, 1971  SUBJECTIVE:  CHIEF COMPLAINT:   Chief Complaint  Patient presents with  . Diarrhea   watery diarrhea 4 times and small blood clot in stool once last night and 3 times this am.   REVIEW OF SYSTEMS:    Review of Systems  Constitutional: Negative for fever, malaise/fatigue and diaphoresis.  HENT: Negative for sore throat.   Eyes: Negative for blurred vision, double vision and pain.  Respiratory: Negative for cough, hemoptysis, shortness of breath and wheezing.   Cardiovascular: Negative for chest pain, palpitations, orthopnea and leg swelling.  Gastrointestinal: Positive for diarrhea. Negative for heartburn, nausea, vomiting, abdominal pain and constipation.  Genitourinary: Negative for dysuria and hematuria.  Musculoskeletal: Negative for myalgias, back pain and joint pain.  Skin: Negative for rash.  Neurological: Negative for sensory change, speech change, focal weakness, weakness and headaches.  Endo/Heme/Allergies: Does not bruise/bleed easily.  Psychiatric/Behavioral: Negative for depression. The patient is not nervous/anxious.     DRUG ALLERGIES:  No Known Allergies  VITALS:  Blood pressure 99/57, pulse 118, temperature 97.9 F (36.6 C), temperature source Oral, resp. rate 18, height 6' (1.829 m), weight 95.255 kg (210 lb), SpO2 100 %.  PHYSICAL EXAMINATION:   Physical Exam  GENERAL:  44 y.o.-year-old patient lying in the bed EYES: Pupils equal, round, reactive to light and accommodation. No scleral icterus. Extraocular muscles intact.  HEENT: Head atraumatic, normocephalic. Oropharynx and nasopharynx clear.  NECK:  Supple, no jugular venous distention. No thyroid enlargement, no tenderness.  LUNGS: Normal breath sounds bilaterally, no wheezing, rales, rhonchi. No use of accessory muscles of respiration.  CARDIOVASCULAR: S1, S2  normal. No murmurs, rubs, or gallops.  ABDOMEN: Soft, nontenderness, nondistended. Bowel sounds present. No organomegaly or mass.  EXTREMITIES: No cyanosis, clubbing. No edema bilateral LE  NEUROLOGIC: Cranial nerves II through XII are intact. No focal Motor or sensory deficits b/l.   PSYCHIATRIC: The patient is awake and alert. Oriented times 3 SKIN: No obvious rash, lesion, or ulcer.   LABORATORY PANEL:   CBC  Recent Labs Lab 06/17/15 0506  WBC 5.2  HGB 7.8*  HCT 23.5*  PLT 138*   ------------------------------------------------------------------------------------------------------------------  Chemistries   Recent Labs Lab 06/17/15 0506  NA 139  K 3.6  CL 116*  CO2 18*  GLUCOSE 119*  BUN 9  CREATININE 0.80  CALCIUM 7.6*  MG 1.3*   ------------------------------------------------------------------------------------------------------------------  Cardiac Enzymes No results for input(s): TROPONINI in the last 168 hours. ------------------------------------------------------------------------------------------------------------------  RADIOLOGY:  No results found.  ASSESSMENT AND PLAN:   44 with alcohol abuse, seizures, hepatic encephalopathy here with diarrhea  * C diff colitis.  IV flagyl and PO vancomycin was discontinued. Continue deficid (5 days so far) and cholestyramine. Avoid other abx. repeat c diff  Negative. possible stool transplant and endoscopy this coming Tuesday per Dr. Rayann Heman.   * septic and hypovolemic shock - Resolved  * Hypokalemia - continue KCl.   * Hypomagnesemia. Mag iv, f/u level.  * Alcohol abuse.  on CIWA- no signs of withdrawal.  stopped.  * Acute encephalopathy,  Due to acute illness. Resolved.  On Rifaximin.  * Hem positive blood with anemia of chronic disease - likely from c diff Hemoglobin decreased to 6.1 , got 2 units of PRBC transfusion, hemoglobin 7.8.  possible endoscopy this coming week per Dr. Rayann Heman.  * Acute  renal failure due to ATN- Resolved.  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 follow with a neurologist as outpatient.  All the records are reviewed and case discussed with Care Management/Social Workerr. Management plans discussed with the patient, RN and they are in agreement.  CODE STATUS: FULL  DVT Prophylaxis: SCDs. No heparin due to blood in stool  TOTAL TIME TAKING CARE OF THIS PATIENT: 33  minutes.    Demetrios Loll M.D on 06/17/2015 at 10:49 AM  Between 7am to 6pm - Pager - [phone]  After 6pm go to [email or web address] - password [email or web address] [facility]  [facility] [facility]  Office  [phone]  CC: Primary care physician; No primary care provider on file.

## 2015-06-17 NOTE — Plan of Care (Signed)
Problem: Discharge Progression Outcomes Goal: Other Discharge Outcomes/Goals Plan of care progress to goal:  Patient complaining of abdominal pain - oxycodone given, relief noted. No complaints of nausea/vomiting - tolerating diet. Enteric precautions maintained - continues to have frequent loose stools. Occult blood - positive. Patient encouraged to use bedpan and/or BSC.   Mag 1.3 - 4g IV once given.

## 2015-06-17 NOTE — Progress Notes (Signed)
CONCERNING: IV to Oral Route Change Policy  RECOMMENDATION: This patient is ordered thiamine by the intravenous route.  Based on criteria approved by the Pharmacy and Therapeutics Committee, the intravenous medication(s) is/are being converted to the equivalent oral dose form(s).  Ordered thiamine oral or IV, has been consistently taking oral thiamin. Will d/c IV order at this time.    DESCRIPTION: These criteria include:  The patient is eating (either orally or via tube) and/or has been taking other orally administered medications for a least 24 hours  The patient has no evidence of active gastrointestinal bleeding or impaired GI absorption (gastrectomy, short bowel, patient on TNA or NPO).   Rexene Edison, PharmD Clinical Pharmacist  06/17/2015

## 2015-06-18 LAB — CBC
HEMATOCRIT: 23.4 % — AB (ref 40.0–52.0)
Hemoglobin: 7.7 g/dL — ABNORMAL LOW (ref 13.0–18.0)
MCH: 32.7 pg (ref 26.0–34.0)
MCHC: 33 g/dL (ref 32.0–36.0)
MCV: 99 fL (ref 80.0–100.0)
PLATELETS: 145 10*3/uL — AB (ref 150–440)
RBC: 2.36 MIL/uL — ABNORMAL LOW (ref 4.40–5.90)
RDW: 17.7 % — ABNORMAL HIGH (ref 11.5–14.5)
WBC: 5 10*3/uL (ref 3.8–10.6)

## 2015-06-18 LAB — BASIC METABOLIC PANEL
ANION GAP: 3 — AB (ref 5–15)
BUN: 9 mg/dL (ref 6–20)
CALCIUM: 8 mg/dL — AB (ref 8.9–10.3)
CO2: 21 mmol/L — ABNORMAL LOW (ref 22–32)
Chloride: 115 mmol/L — ABNORMAL HIGH (ref 101–111)
Creatinine, Ser: 0.69 mg/dL (ref 0.61–1.24)
GFR calc Af Amer: >60 mL/min (ref >60)
GFR calc non Af Amer: >60 mL/min (ref >60)
Glucose, Bld: 90 mg/dL (ref 65–99)
Potassium: 4 mmol/L (ref 3.5–5.1)
Sodium: 139 mmol/L (ref 135–145)

## 2015-06-18 LAB — C DIFFICILE QUICK SCREEN W PCR REFLEX
C DIFFICILE (CDIFF) INTERP: NEGATIVE
C Diff antigen: NEGATIVE
C Diff toxin: NEGATIVE

## 2015-06-18 LAB — MAGNESIUM: Magnesium: 1.4 mg/dL — ABNORMAL LOW (ref 1.7–2.4)

## 2015-06-18 MED ORDER — MAGNESIUM OXIDE 400 (241.3 MG) MG PO TABS
400.0000 mg | ORAL_TABLET | Freq: Every day | ORAL | Status: AC
  Administered 2015-06-18 – 2015-06-19 (×2): 400 mg via ORAL
  Filled 2015-06-18 (×2): qty 1

## 2015-06-18 MED ORDER — MAGNESIUM SULFATE 4 GM/100ML IV SOLN
4.0000 g | Freq: Once | INTRAVENOUS | Status: AC
  Administered 2015-06-18: 10:00:00 4 g via INTRAVENOUS
  Filled 2015-06-18: qty 100

## 2015-06-18 NOTE — Clinical Social Work Note (Signed)
Per pt request, CSW sent a letter to his lawyers to inform them that he will not be in court due to admission and procedure. CSW placed a copy on the chart.   Darden Dates, MSW, LCSW Clinical Social Worker  [phone]

## 2015-06-18 NOTE — Progress Notes (Signed)
[place] at [place] NAME: Trayson Stitely    MR#:  [id number]  DATE OF BIRTH:  [birth year]  SUBJECTIVE:  CHIEF COMPLAINT:   Chief Complaint  Patient presents with  . Diarrhea   watery diarrhea 3 times today. No bloody stool.  REVIEW OF SYSTEMS:    Review of Systems  Constitutional: Negative for fever, malaise/fatigue and diaphoresis.  HENT: Negative for sore throat.   Eyes: Negative for blurred vision, double vision and pain.  Respiratory: Negative for cough, hemoptysis, shortness of breath and wheezing.   Cardiovascular: Negative for chest pain, palpitations, orthopnea and leg swelling.  Gastrointestinal: Positive for diarrhea. Negative for heartburn, nausea, vomiting, abdominal pain and constipation.  Genitourinary: Negative for dysuria and hematuria.  Musculoskeletal: Negative for myalgias, back pain and joint pain.  Skin: Negative for rash.  Neurological: Negative for sensory change, speech change, focal weakness, weakness and headaches.  Endo/Heme/Allergies: Does not bruise/bleed easily.  Psychiatric/Behavioral: Negative for depression. The patient is not nervous/anxious.     DRUG ALLERGIES:  No Known Allergies  VITALS:  Blood pressure 101/61, pulse 113, temperature 98.2 F (36.8 C), temperature source Oral, resp. rate 18, height 6' (1.829 m), weight 95.255 kg (210 lb), SpO2 99 %.  PHYSICAL EXAMINATION:   Physical Exam  GENERAL:  44 y.o.-year-old patient lying in the bed EYES: Pupils equal, round, reactive to light and accommodation. No scleral icterus. Extraocular muscles intact.  HEENT: Head atraumatic, normocephalic. Oropharynx and nasopharynx clear.  NECK:  Supple, no jugular venous distention. No thyroid enlargement, no tenderness.  LUNGS: Normal breath sounds bilaterally, no wheezing, rales, rhonchi. No use of accessory muscles of respiration.  CARDIOVASCULAR: S1, S2 normal. No murmurs, rubs, or gallops.  ABDOMEN:  Soft, nontenderness, nondistended. Bowel sounds present. No organomegaly or mass.  EXTREMITIES: No cyanosis, clubbing. No edema bilateral LE  NEUROLOGIC: Cranial nerves II through XII are intact. No focal Motor or sensory deficits b/l.   PSYCHIATRIC: The patient is awake and alert. Oriented times 3 SKIN: No obvious rash, lesion, or ulcer.   LABORATORY PANEL:   CBC  Recent Labs Lab 06/18/15 0629  WBC 5.0  HGB 7.7*  HCT 23.4*  PLT 145*   ------------------------------------------------------------------------------------------------------------------  Chemistries   Recent Labs Lab 06/18/15 0629  NA 139  K 4.0  CL 115*  CO2 21*  GLUCOSE 90  BUN 9  CREATININE 0.69  CALCIUM 8.0*  MG 1.4*   ------------------------------------------------------------------------------------------------------------------  Cardiac Enzymes No results for input(s): TROPONINI in the last 168 hours. ------------------------------------------------------------------------------------------------------------------  RADIOLOGY:  No results found.  ASSESSMENT AND PLAN:   67 m with alcohol abuse, seizures, hepatic encephalopathy here with diarrhea  * C diff colitis.  IV flagyl and PO vancomycin was discontinued. Continue deficid (6 days so far) and cholestyramine. Avoid other abx. repeat c diff  Negative. possible stool transplant and endoscopy tomorrow.   * septic and hypovolemic shock - Resolved  * Hypokalemia - improved. continue KCl.   * Hypomagnesemia. Mag iv, f/u level.  * Alcohol abuse.  on CIWA- no signs of withdrawal.  stopped.  * Acute encephalopathy,  Due to acute illness. Resolved.  On Rifaximin.  * Hem positive blood with anemia of chronic disease - likely from c diff Hemoglobin decreased to 6.1 , got 2 units of PRBC transfusion, hemoglobin is stable.  possible endoscopy tomorrow per Dr. Rayann Heman.  * Acute renal failure due to ATN- Resolved.  * Thrombocytopenia,  chronic Due to alcohol and cirrhosis.  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 follow with a neurologist as outpatient.  All the records are reviewed and case discussed with Care Management/Social Workerr. Management plans discussed with the patient, RN and they are in agreement.  CODE STATUS: FULL  DVT Prophylaxis: SCDs. No heparin due to blood in stool  TOTAL TIME TAKING CARE OF THIS PATIENT: 33  minutes.    Demetrios Loll M.D on 06/18/2015 at 10:35 AM  Between 7am to 6pm - Pager - [phone]  After 6pm go to [email or web address] - password [email or web address] [facility]  [place] [facility]  Office  [phone]  CC: Primary care physician; No primary care provider on file.

## 2015-06-18 NOTE — Consult Note (Signed)
Subjective: Patient seen for  Recurrent c diff enterocolitis.  Tolerating po.  Continues with mild abdominal discomfort lower abdomen-3/10/ no emesis.  Stool pudding consistancy with less frequency recorded.   Objective: Vital signs in last 24 hours: Temp:  [97.7 F (36.5 C)-98.3 F (36.8 C)] 97.7 F (36.5 C) (07/04 1339) Pulse Rate:  [82-113] 93 (07/04 1339) Resp:  [18-20] 20 (07/04 1339) BP: (101-115)/(61-69) 107/62 mmHg (07/04 1339) SpO2:  [99 %-100 %] 100 % (07/04 1339) Blood pressure 107/62, pulse 93, temperature 97.7 F (36.5 C), temperature source Oral, resp. rate 20, height 6' (1.829 m), weight 95.255 kg (210 lb), SpO2 100 %.   Intake/Output from previous day: 07/03 0701 - 07/04 0700 In: 103 [I.V.:3; IV Piggyback:100] Out: 850 [Urine:850]  Intake/Output this shift: Total I/O In: 100 [IV Piggyback:100] Out: -    General appearance:  Obese, no distress.   Resp:  cta Cardio:  rrr GI:  Soft obese, unable to palpate internal organs.  Mild lower abdominal discomfort to palpation. bs positive normoactive.   Extremities:     Lab Results: Results for orders placed or performed during the hospital encounter of 06/06/15 (from the past 24 hour(s))  Magnesium     Status: Abnormal   Collection Time: 06/18/15  6:29 AM  Result Value Ref Range   Magnesium 1.4 (L) 1.7 - 2.4 mg/dL  Basic metabolic panel     Status: Abnormal   Collection Time: 06/18/15  6:29 AM  Result Value Ref Range   Sodium 139 135 - 145 mmol/L   Potassium 4.0 3.5 - 5.1 mmol/L   Chloride 115 (H) 101 - 111 mmol/L   CO2 21 (L) 22 - 32 mmol/L   Glucose, Bld 90 65 - 99 mg/dL   BUN 9 6 - 20 mg/dL   Creatinine, Ser 0.69 0.61 - 1.24 mg/dL   Calcium 8.0 (L) 8.9 - 10.3 mg/dL   GFR calc non Af Amer >60 >60 mL/min   GFR calc Af Amer >60 >60 mL/min   Anion gap 3 (L) 5 - 15  CBC     Status: Abnormal   Collection Time: 06/18/15  6:29 AM  Result Value Ref Range   WBC 5.0 3.8 - 10.6 K/uL   RBC 2.36 (L) 4.40 - 5.90  MIL/uL   Hemoglobin 7.7 (L) 13.0 - 18.0 g/dL   HCT 23.4 (L) 40.0 - 52.0 %   MCV 99.0 80.0 - 100.0 fL   MCH 32.7 26.0 - 34.0 pg   MCHC 33.0 32.0 - 36.0 g/dL   RDW 17.7 (H) 11.5 - 14.5 %   Platelets 145 (L) 150 - 440 K/uL      Recent Labs  06/16/15 0455 06/17/15 0506 06/18/15 0629  WBC 5.7 5.2 5.0  HGB 8.1* 7.8* 7.7*  HCT 24.5* 23.5* 23.4*  PLT 124* 138* 145*   BMET  Recent Labs  06/16/15 0455 06/17/15 0506 06/18/15 0629  NA 138 139 139  K 3.2* 3.6 4.0  CL 114* 116* 115*  CO2 19* 18* 21*  GLUCOSE 86 119* 90  BUN 10 9 9   CREATININE 0.92 0.80 0.69  CALCIUM 6.9* 7.6* 8.0*   LFT No results for input(s): PROT, ALBUMIN, AST, ALT, ALKPHOS, BILITOT, BILIDIR, IBILI in the last 72 hours. PT/INR No results for input(s): LABPROT, INR in the last 72 hours. Hepatitis Panel No results for input(s): HEPBSAG, HCVAB, HEPAIGM, HEPBIGM in the last 72 hours. C-Diff No results for input(s): CDIFFTOX in the last 72 hours. No results for  input(s): CDIFFPCR in the last 72 hours.   Studies/Results: No results found.  Scheduled Inpatient Medications:   . allopurinol  300 mg Oral Daily  . cholestyramine light  4 g Oral TID  . fidaxomicin  200 mg Oral BID  . folic acid  1 mg Oral Daily  . levothyroxine  175 mcg Oral QAC breakfast  . magnesium oxide  400 mg Oral Daily  . metoprolol tartrate  25 mg Oral BID  . pantoprazole  40 mg Oral BID  . potassium chloride  40 mEq Oral BID  . saccharomyces boulardii  250 mg Oral BID  . sodium chloride  3 mL Intravenous Q12H  . thiamine  100 mg Oral Daily  . Vitamin D (Ergocalciferol)  50,000 Units Oral Q7 days    Continuous Inpatient Infusions:     PRN Inpatient Medications:  [DISCONTINUED] acetaminophen **OR** acetaminophen, gabapentin, labetalol, liver oil-zinc oxide, oxyCODONE, pregabalin, QUEtiapine  Miscellaneous:   Assessment:  1) c diff enteritis/colitis- slow improvement.  Continue current.  Will recheck stool c diff.     Plan:  As above.   Lollie Sails MD 06/18/2015, 3:33 PM

## 2015-06-18 NOTE — Plan of Care (Signed)
Problem: Discharge Progression Outcomes Goal: Other Discharge Outcomes/Goals Plan of care progress to goal:  Patient continues to complain of abdominal pain - oxycodone given, relief noted. No complaints of nausea/vomiting - tolerating diet. Enteric precautions maintained - continues to have loose stools. No blood in stool noted this shift. Patient encouraged to use bedpan and/or BSC. Magnesium replacement given. Per Dr. Bridgett Larsson - possible stool transplant tomorrow.

## 2015-06-19 LAB — CBC
HCT: 24.1 % — ABNORMAL LOW (ref 40.0–52.0)
HEMOGLOBIN: 7.9 g/dL — AB (ref 13.0–18.0)
MCH: 32.7 pg (ref 26.0–34.0)
MCHC: 32.8 g/dL (ref 32.0–36.0)
MCV: 99.7 fL (ref 80.0–100.0)
Platelets: 152 10*3/uL (ref 150–440)
RBC: 2.42 MIL/uL — ABNORMAL LOW (ref 4.40–5.90)
RDW: 17.3 % — ABNORMAL HIGH (ref 11.5–14.5)
WBC: 4.3 10*3/uL (ref 3.8–10.6)

## 2015-06-19 LAB — BASIC METABOLIC PANEL
Anion gap: 5 (ref 5–15)
BUN: 9 mg/dL (ref 6–20)
CO2: 24 mmol/L (ref 22–32)
Calcium: 8.2 mg/dL — ABNORMAL LOW (ref 8.9–10.3)
Chloride: 111 mmol/L (ref 101–111)
Creatinine, Ser: 0.71 mg/dL (ref 0.61–1.24)
GLUCOSE: 111 mg/dL — AB (ref 65–99)
Potassium: 3.7 mmol/L (ref 3.5–5.1)
SODIUM: 140 mmol/L (ref 135–145)

## 2015-06-19 LAB — MAGNESIUM: Magnesium: 1.4 mg/dL — ABNORMAL LOW (ref 1.7–2.4)

## 2015-06-19 MED ORDER — MAGNESIUM SULFATE 2 GM/50ML IV SOLN
2.0000 g | Freq: Once | INTRAVENOUS | Status: AC
  Administered 2015-06-19: 10:00:00 2 g via INTRAVENOUS
  Filled 2015-06-19: qty 50

## 2015-06-19 MED ORDER — LOPERAMIDE HCL 2 MG PO CAPS
2.0000 mg | ORAL_CAPSULE | Freq: Three times a day (TID) | ORAL | Status: DC
  Administered 2015-06-19 – 2015-06-21 (×5): 2 mg via ORAL
  Filled 2015-06-19 (×5): qty 1

## 2015-06-19 NOTE — Progress Notes (Signed)
[place] at [place] NAME: Troy [name]    MR#:  [id number]  DATE OF BIRTH:  31-May-1971  SUBJECTIVE:  CHIEF COMPLAINT:   Chief Complaint  Patient presents with  . Diarrhea   Worsening watery diarrhea 7-8 times today. No bloody stool.  REVIEW OF SYSTEMS:    Review of Systems  Constitutional: Negative for fever, malaise/fatigue and diaphoresis.  HENT: Negative for sore throat.   Eyes: Negative for blurred vision, double vision and pain.  Respiratory: Negative for cough, hemoptysis, shortness of breath and wheezing.   Cardiovascular: Negative for chest pain, palpitations, orthopnea and leg swelling.  Gastrointestinal: Positive for diarrhea. Negative for heartburn, nausea, vomiting, abdominal pain and constipation.  Genitourinary: Negative for dysuria and hematuria.  Musculoskeletal: Negative for myalgias, back pain and joint pain.  Skin: Negative for rash.  Neurological: Negative for sensory change, speech change, focal weakness, weakness and headaches.  Endo/Heme/Allergies: Does not bruise/bleed easily.  Psychiatric/Behavioral: Negative for depression. The patient is not nervous/anxious.     DRUG ALLERGIES:  No Known Allergies  VITALS:  Blood pressure 125/76, pulse 107, temperature 98 F (36.7 C), temperature source Oral, resp. rate 18, height 6' (1.829 m), weight 95.255 kg (210 lb), SpO2 100 %.  PHYSICAL EXAMINATION:   Physical Exam  GENERAL:  44 y.o.-year-old patient lying in the bed EYES: Pupils equal, round, reactive to light and accommodation. No scleral icterus. Extraocular muscles intact.  HEENT: Head atraumatic, normocephalic. Oropharynx and nasopharynx clear.  NECK:  Supple, no jugular venous distention. No thyroid enlargement, no tenderness.  LUNGS: Normal breath sounds bilaterally, no wheezing, rales, rhonchi. No use of accessory muscles of respiration.  CARDIOVASCULAR: S1, S2 normal. No murmurs, rubs, or gallops.   ABDOMEN: Soft, nontenderness, nondistended. Bowel sounds present. No organomegaly or mass.  EXTREMITIES: No cyanosis, clubbing. No edema bilateral LE  NEUROLOGIC: Cranial nerves II through XII are intact. No focal Motor or sensory deficits b/l.   PSYCHIATRIC: The patient is awake and alert. Oriented times 3 SKIN: No obvious rash, lesion, or ulcer.   LABORATORY PANEL:   CBC  Recent Labs Lab 06/19/15 0541  WBC 4.3  HGB 7.9*  HCT 24.1*  PLT 152   ------------------------------------------------------------------------------------------------------------------  Chemistries   Recent Labs Lab 06/19/15 0541  NA 140  K 3.7  CL 111  CO2 24  GLUCOSE 111*  BUN 9  CREATININE 0.71  CALCIUM 8.2*  MG 1.4*   ------------------------------------------------------------------------------------------------------------------  Cardiac Enzymes No results for input(s): TROPONINI in the last 168 hours. ------------------------------------------------------------------------------------------------------------------  RADIOLOGY:  No results found.  ASSESSMENT AND PLAN:   63 m with alcohol abuse, seizures, hepatic encephalopathy here with diarrhea  * C diff colitis.  IV flagyl and PO vancomycin was discontinued. Continue deficid (7 days so far) and cholestyramine. Avoid other abx. repeat c diff  Negative. Clear liquid diet today and Stool transplant and endoscopy tomorrow per Dr. Tiffany Kocher.   * septic and hypovolemic shock - Resolved  * Hypokalemia - improved. continue KCl.   * Hypomagnesemia. Mag iv, f/u level.  * Alcohol abuse.  on CIWA- no signs of withdrawal.  stopped.  * Acute encephalopathy,  Due to acute illness. Resolved.  On Rifaximin.  * Hem positive blood with anemia of chronic disease - likely from c diff Hemoglobin decreased to 6.1 , got 2 units of PRBC transfusion, hemoglobin is stable.  endoscopy tomorrow.  * Acute renal failure due to ATN- Resolved.  *  Thrombocytopenia, chronic Due to  alcohol and cirrhosis.  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 follow with a neurologist as outpatient.  All the records are reviewed and case discussed with Care Management/Social Workerr. Management plans discussed with the patient, RN and they are in agreement.  CODE STATUS: FULL  DVT Prophylaxis: SCDs. No heparin due to blood in stool  TOTAL TIME TAKING CARE OF THIS PATIENT: 35  minutes.    Demetrios Loll M.D on 06/19/2015 at 1:39 PM  Between 7am to 6pm - Pager - [phone]  After 6pm go to [email or web address] - password [email or web address] [facility]  [place] [facility]  Office  [phone]  CC: Primary care physician; No primary care provider on file.

## 2015-06-19 NOTE — Progress Notes (Signed)
GI Inpatient Follow-up Note  Patient Identification: Troy [name] is a 44 y.o. male with fecal incontinence, cleared c.diff  Subjective:  Troy [name] reports ongoing problems with incontinence and loose stools. However nursing reports only 2 or 3 stools and not liquidy, just soft. No blood in stools. He says he cannot getup to use bathroom because will loose stool from getting up but then stays in bed and has incontinence anyways.   Scheduled Inpatient Medications:  . allopurinol  300 mg Oral Daily  . cholestyramine light  4 g Oral TID  . fidaxomicin  200 mg Oral BID  . folic acid  1 mg Oral Daily  . levothyroxine  175 mcg Oral QAC breakfast  . metoprolol tartrate  25 mg Oral BID  . pantoprazole  40 mg Oral BID  . potassium chloride  40 mEq Oral BID  . saccharomyces boulardii  250 mg Oral BID  . sodium chloride  3 mL Intravenous Q12H  . thiamine  100 mg Oral Daily  . Vitamin D (Ergocalciferol)  50,000 Units Oral Q7 days    Continuous Inpatient Infusions:     PRN Inpatient Medications:  [DISCONTINUED] acetaminophen **OR** acetaminophen, gabapentin, labetalol, liver oil-zinc oxide, oxyCODONE, pregabalin, QUEtiapine    Physical Examination: BP 122/70 mmHg  Pulse 83  Temp(Src) 98 F (36.7 C) (Oral)  Resp 18  Ht 6' (1.829 m)  Wt 95.255 kg (210 lb)  BMI 28.47 kg/m2  SpO2 99% Gen: NAD, alert and oriented x 4 HEENT: PEERLA, EOMI, Neck: supple, no JVD or thyromegaly Chest: CTA bilaterally, no wheezes, crackles, or other adventitious sounds CV: RRR, no m/g/c/r Abd: +obese, mild TTP, nabs,  Ext: no edema, well perfused with 2+ pulses, Skin: no rash or lesions noted Lymph: no LAD  Data: Lab Results  Component Value Date   WBC 4.3 06/19/2015   HGB 7.9* 06/19/2015   HCT 24.1* 06/19/2015   MCV 99.7 06/19/2015   PLT 152 06/19/2015    Recent Labs Lab 06/17/15 0506 06/18/15 0629 06/19/15 0541  HGB 7.8* 7.7* 7.9*   Lab Results  Component Value Date   NA 140 06/19/2015    K 3.7 06/19/2015   CL 111 06/19/2015   CO2 24 06/19/2015   BUN 9 06/19/2015   CREATININE 0.71 06/19/2015   Lab Results  Component Value Date   ALT 11* 06/08/2015   AST 41 06/08/2015   ALKPHOS 201* 06/08/2015   BILITOT 1.2 06/08/2015    Recent Labs Lab 06/14/15 1736  INR 1.31   Assessment/Plan: Troy [name] is a 44 y.o. male with fecal incontinence, severe deconditioning, cleared c.diff.  C.diff stools study now neg x 2.   There is a discrepancy between what the patient is reporting in terms of stool frequency and consistency and what the nursing staff is observing. It seems the biggest problem is now just fecal incontinence and not diarrhea.  Hgb stable.   Recommendations: - start imodium 2 gr q8 hr - cont dificid to complete 14 day course - pancreatic elastase - PT will be important to get him out of bed so he can stop being incontinent.   Please call with questions or concerns.  Milbert Bixler, Grace Blight, MD

## 2015-06-19 NOTE — Progress Notes (Signed)
PT Cancellation Note  Patient Details Name: [name] MRN: [id number] DOB: 05-27-1971   Cancelled Treatment:    Reason Eval/Treat Not Completed: Patient declined, no reason specified (Pt reporting his stomache was too "crampy" to participate and felt like he was going to have diarrhea "everywhere".)  Will re-attempt PT treatment at a later date/time as able/appropriate.   Raquel Sarna Alphonsus Doyel 06/19/2015, [place], [place]

## 2015-06-19 NOTE — Plan of Care (Addendum)
Problem: Discharge Progression Outcomes Goal: Other Discharge Outcomes/Goals Outcome: Not Progressing Patient back on regular diet.  VSS Soft stool today more soft than liquid.  Dr. Rayann Heman seen MD late this afternoon and wanted to start imodium, and run more stool studies since cdiff neg and frequency of stools improving.  Patient remains on contact enteric precautions.  Covered with pain medications twice today for "generalized pain."  Tolerating appetite good.

## 2015-06-19 NOTE — Progress Notes (Signed)
Nutrition Follow-up  INTERVENTION:  Coordination of Care: spoke with RN Mendel Ryder regarding diet order this am as pt was NPO tonight for midnight with no active diet order today. MD Bridgett Larsson on the floor, agreeable to CL diet order for today after clarifing with GI team. Pt reported GI MD wanted no reds for surgical intervention tomorrow.  Coordination of Care: Will also request new weight as last weight on admission.    NUTRITION DIAGNOSIS:  Inadequate oral intake related to altered GI function as evidenced by  (current diet order, no solids since admission); improved with regular diet however will be NPO tonight after midnight for tomorrow  GOAL:  Patient will meet greater than or equal to 90% of their needs s/p surgical procedure  MONITOR:   (Energy Intake, digestive system, Anthropometrics)    ASSESSMENT:  Pt scheduled for fecal transplant tomorrow and endoscopy; repeat c.diff negative; pt remains with diarrhea. Pt sipping on CL on visit this afternoon on visit.   Diet Order: CL  Current Nutrition: Pt ate 2 bowls of chicken broth, jello, tea, juice at lunch today. Pt reports good appetite on regular diet eating the majority of meal trays until today.   Gastrointestinal Profile: Last BM: +diarrhea   Medications: thiamine, vitamin D, KCl, Protonix, folate  Electrolyte/Renal Profile and Glucose Profile:   Recent Labs Lab 06/17/15 0506 06/18/15 0629 06/19/15 0541  NA 139 139 140  K 3.6 4.0 3.7  CL 116* 115* 111  CO2 18* 21* 24  BUN 9 9 9   CREATININE 0.80 0.69 0.71  CALCIUM 7.6* 8.0* 8.2*  MG 1.3* 1.4* 1.4*  GLUCOSE 119* 90 111*   Protein Profile: No results for input(s): ALBUMIN in the last 168 hours.    Weight Trend since Admission: Filed Weights   06/06/15 1555  Weight: 210 lb (95.255 kg)   RD weighed pt on visit at 225.3lbs, however unclear of accuracy.   BMI:  Body mass index is 28.47 kg/(m^2).  Estimated Nutritional Needs:  Kcal:  2286-2701kcals,  BEE: 1731kcals, TEE: (IF 1.1-1.3)(AF 1.2) using IBW of 80.9kg  Protein:  80-97g protein (1.0-1.2g/kg) using IBW of 80.9kg  Fluid:  2023-2416mL of fluid (25-17mL/kg) using IBW of 80.9kg  EDUCATION NEEDS:  No education needs identified at this time   [place], RD, LDN Pager [phone]

## 2015-06-20 LAB — BASIC METABOLIC PANEL
Anion gap: 4 — ABNORMAL LOW (ref 5–15)
BUN: 9 mg/dL (ref 6–20)
CHLORIDE: 112 mmol/L — AB (ref 101–111)
CO2: 23 mmol/L (ref 22–32)
Calcium: 8.2 mg/dL — ABNORMAL LOW (ref 8.9–10.3)
Creatinine, Ser: 0.65 mg/dL (ref 0.61–1.24)
GFR calc Af Amer: >60 mL/min (ref >60)
GFR calc non Af Amer: >60 mL/min (ref >60)
GLUCOSE: 93 mg/dL (ref 65–99)
POTASSIUM: 4.2 mmol/L (ref 3.5–5.1)
SODIUM: 139 mmol/L (ref 135–145)

## 2015-06-20 LAB — T4, FREE: FREE T4: 1.13 ng/dL — AB (ref 0.61–1.12)

## 2015-06-20 LAB — MAGNESIUM: Magnesium: 1.4 mg/dL — ABNORMAL LOW (ref 1.7–2.4)

## 2015-06-20 MED ORDER — MAGNESIUM SULFATE 2 GM/50ML IV SOLN
2.0000 g | Freq: Once | INTRAVENOUS | Status: AC
  Administered 2015-06-20: 09:00:00 2 g via INTRAVENOUS
  Filled 2015-06-20: qty 50

## 2015-06-20 MED ORDER — ZINC OXIDE 20 % EX OINT
TOPICAL_OINTMENT | Freq: Two times a day (BID) | CUTANEOUS | Status: DC
  Administered 2015-06-20: 21:00:00 via TOPICAL
  Filled 2015-06-20: qty 28.35

## 2015-06-20 NOTE — Progress Notes (Signed)
[facility] INFECTIOUS DISEASE PROGRESS NOTE Date of Admission:  06/06/2015     ID: Troy [name] is a 44 y.o. [name] with  Cirrhosis, C diff.  Active Problems:   Sepsis   Acute renal failure   Hypokalemia   Enteritis due to Clostridium difficile   Anemia of chronic disease   Hyponatremia   Hypocalcemia   Protein-calorie malnutrition, severe   Subjective: Diarrhea seems to be improving some. Mud like consistency.  Still with raw bottom. Not getting oob much   ROS  Eleven systems are reviewed and negative except per hpi  Medications:  Antibiotics Given (last 72 hours)    Date/Time Action Medication Dose   06/17/15 0904 Given   fidaxomicin (DIFICID) tablet 200 mg 200 mg   06/17/15 2202 Given   fidaxomicin (DIFICID) tablet 200 mg 200 mg   06/18/15 1017 Given   fidaxomicin (DIFICID) tablet 200 mg 200 mg   06/18/15 2149 Given   fidaxomicin (DIFICID) tablet 200 mg 200 mg   06/19/15 1009 Given   fidaxomicin (DIFICID) tablet 200 mg 200 mg   06/19/15 2134 Given   fidaxomicin (DIFICID) tablet 200 mg 200 mg     . allopurinol  300 mg Oral Daily  . cholestyramine light  4 g Oral TID  . fidaxomicin  200 mg Oral BID  . folic acid  1 mg Oral Daily  . levothyroxine  175 mcg Oral QAC breakfast  . loperamide  2 mg Oral TID  . magnesium sulfate 1 - 4 g bolus IVPB  2 g Intravenous Once  . metoprolol tartrate  25 mg Oral BID  . pantoprazole  40 mg Oral BID  . potassium chloride  40 mEq Oral BID  . saccharomyces boulardii  250 mg Oral BID  . sodium chloride  3 mL Intravenous Q12H  . thiamine  100 mg Oral Daily  . Vitamin D (Ergocalciferol)  50,000 Units Oral Q7 days    Objective: Vital signs in last 24 hours: Temp:  [97.9 F (36.6 C)-98 F (36.7 C)] 97.9 F (36.6 C) (07/05 2129) Pulse Rate:  [83-107] 99 (07/05 2129) Resp:  [16] 16 (07/05 2129) BP: (122-125)/(70-76) 122/72 mmHg (07/05 2129) SpO2:  [99 %-100 %] 99 % (07/05 2129) Weight:  [98.34 kg (216 lb 12.8 oz)] 98.34 kg  (216 lb 12.8 oz) (07/06 0123) Constitutional: obese, somewhat confused, chornically ill appearing  HENT:  Mouth/Throat: Oropharynx is clear and dry. No oropharyngeal exudate.  Cardiovascular: tachy  Pulmonary/Chest: Effort normal and breath sounds normal. No respiratory distress. He has no wheezes.  Abdominal: Soft. Distended, mild diffuse ttp Bowel sounds are normal. Rectal with large ext hemorrhoids, poor rectal tone, he is lying in stool lots of excorciations  foley in place Lymphadenopathy:  He has no cervical adenopathy.  Neurological: He is awake but groggy and poor historian, + asterisix  Skin: Skin is warm and dry. No rash noted. No erythema.  Psychiatric: groggy  Lab Results  Recent Labs  06/18/15 [id number] 06/19/15 0541 06/20/15 0511  WBC 5.0 4.3  --   HGB 7.7* 7.9*  --   HCT 23.4* 24.1*  --   NA 139 140 139  K 4.0 3.7 4.2  CL 115* 111 112*  CO2 21* 24 23  BUN 9 9 9   CREATININE 0.69 0.71 0.65   Microbiology: Results for orders placed or performed during the hospital encounter of 06/06/15  Blood Culture (routine x 2)     Status: None   Collection Time: 06/06/15  4:14 PM  Result Value Ref Range Status   Specimen Description BLOOD  Final   Special Requests NONE  Final   Culture NO GROWTH 5 DAYS  Final   Report Status 06/11/2015 FINAL  Final  Stool culture     Status: None   Collection Time: 06/06/15  4:14 PM  Result Value Ref Range Status   Specimen Description STOOL  Final   Special Requests Normal  Final   Culture   Final    NO SALMONELLA OR SHIGELLA ISOLATED No Pathogenic E. coli detected    Report Status 06/10/2015 FINAL  Final  C difficile quick scan w PCR reflex ([facility] only)     Status: None   Collection Time: 06/06/15  4:20 PM  Result Value Ref Range Status   C Diff antigen POSITIVE  Final   C Diff toxin NEGATIVE  Final   C Diff interpretation   Final    Positive for toxigenic C. difficile, active toxin production not detected. Patient has  toxigenic C. difficile organisms present in the bowel, but toxin was not detected. The patient may be a carrier or the level of toxin in the sample was below the limit  of detection. This information should be used in conjunction with the patient's clinical history when deciding on possible therapy.     Comment: CRITICAL RESULT CALLED TO, READ BACK BY AND VERIFIED WITH: LISA THOMPSON 06/06/15 AT 2033.SDR   Blood Culture (routine x 2)     Status: None   Collection Time: 06/06/15  4:20 PM  Result Value Ref Range Status   Specimen Description BLOOD  Final   Special Requests NONE  Final   Culture  Setup Time   Final    GRAM POSITIVE RODS ANAEROBIC BOTTLE ONLY CRITICAL RESULT CALLED TO, READ BACK BY AND VERIFIED WITH: Kathi Simpers AT 4401 06/07/15 DV    Culture   Final    CLOSTRIDIUM PARAPUTRIFICUM ANAEROBIC BOTTLE ONLY COAGULASE NEGATIVE STAPHYLOCOCCUS AEROBIC BOTTLE ONLY POSSIBLE CONTAMINATION WITH SKIN FLORA     Report Status 06/12/2015 FINAL  Final   Organism ID, Bacteria CLOSTRIDIUM PARAPUTRIFICUM  Final  Clostridium Difficile by PCR (not at [facility])     Status: Abnormal   Collection Time: 06/06/15  4:20 PM  Result Value Ref Range Status   C difficile by pcr POSITIVE (A) NEGATIVE Final    Comment: Positive for toxigenic C. difficile, active toxin production not detected. Patient has toxigenic C. difficile organisms present in the bowel, but toxin was not detected. The patient may be a carrier or the level of toxin in the sample was below the limit  of detection. This information should be used in conjunction with the patient's clinical history when deciding on possible therapy. CRITICAL RESULT CALLED TO, READ BACK BY AND VERIFIED WITH: LISA THOMPSON 06/06/15 2030. SDW   Giardia, EIA; Ova/Parasite     Status: None   Collection Time: 06/06/15  5:09 PM  Result Value Ref Range Status   Ova + Parasite Exam Final report  Final    Comment: (NOTE) These results were obtained using  wet preparation(s) and trichrome stained smear. This test does not include testing for Cryptosporidium parvum, Cyclospora, or Microsporidia.    Giardia Ag, Stl Negative Negative Final    Comment: (NOTE) Performed At: [facility] [place], [place] [id number] Caprice Red MD QI:[id number] Performed At: [facility] [address] [place], [place] [id number] Lindon Romp MD JJ:[id number]    Source  of Sample STOOL  Corrected  MRSA PCR Screening     Status: None   Collection Time: 06/06/15  9:00 PM  Result Value Ref Range Status   MRSA by PCR NEGATIVE NEGATIVE Final    Comment:        The GeneXpert MRSA Assay (FDA approved for NASAL specimens only), is one component of a comprehensive MRSA colonization surveillance program. It is not intended to diagnose MRSA infection nor to guide or monitor treatment for MRSA infections.   Urine culture     Status: None   Collection Time: 06/07/15  3:41 PM  Result Value Ref Range Status   Specimen Description URINE, CLEAN CATCH  Final   Special Requests NONE  Final   Culture NO GROWTH 2 DAYS  Final   Report Status 06/09/2015 FINAL  Final  C difficile quick scan w PCR reflex ([facility] only)     Status: None   Collection Time: 06/12/15  4:21 PM  Result Value Ref Range Status   C Diff antigen NEGATIVE  Final   C Diff toxin NEGATIVE  Final   C Diff interpretation Negative for C. difficile  Final  C difficile quick scan w PCR reflex ([facility] only)     Status: None   Collection Time: 06/18/15 10:30 PM  Result Value Ref Range Status   C Diff antigen NEGATIVE  Final   C Diff toxin NEGATIVE  Final   C Diff interpretation Negative for C. difficile  Final    Studies/Results: No results found.  Assessment/Plan: Troy [name] is a 45 y.o. [name] with a known history of recent admission for sepsis, alcohol related seizures, metabolic , hepatic encephalopathy for which he was discharged on lactulose and Xifaxan  admitted 6/22 with complaints of ongoing diarrhea and feeling dehydrated and weak. He was noted to be hypotensive with systolic blood pressure in 60s and had marked leukocytpsis.  Of note he was admitted 5/29-6/2 with sepsis and C diff. At that time he was treated with IV abx in ICU - dced on levo and flagyl at that time. He appears to be continuing to drink with + etoh level on admission. WBC improved. Pisek 1/2 + CNS and C diff + CNS bacteremia likely contaminant. Diarrrhea improving some  Recommendations Agree with GI, continue dificid for 14 day course Is on imodium and cholestyramine  Thank you very much for the consult. Will sign off but please call with questions.  [place], [place]   06/20/2015, 8:34 AM

## 2015-06-20 NOTE — Progress Notes (Signed)
[name] [place] NAME: Troy [name]    MR#:  [id number]  DATE OF BIRTH:  Nov 07, 1971  SUBJECTIVE:  CHIEF COMPLAINT:   Chief Complaint  Patient presents with  . Diarrhea   watery diarrhea 5-6 times today per Pt. But per RN, 3 times loose stool.  REVIEW OF SYSTEMS:    Review of Systems  Constitutional: Negative for fever, malaise/fatigue and diaphoresis.  HENT: Negative for sore throat.   Eyes: Negative for blurred vision, double vision and pain.  Respiratory: Negative for cough, hemoptysis, shortness of breath and wheezing.   Cardiovascular: Negative for chest pain, palpitations, orthopnea and leg swelling.  Gastrointestinal: Positive for diarrhea. Negative for heartburn, nausea, vomiting, abdominal pain and constipation.  Genitourinary: Negative for dysuria and hematuria.  Musculoskeletal: Negative for myalgias, back pain and joint pain.  Skin: Negative for rash.  Neurological: Negative for sensory change, speech change, focal weakness, weakness and headaches.  Endo/Heme/Allergies: Does not bruise/bleed easily.  Psychiatric/Behavioral: Negative for depression. The patient is not nervous/anxious.     DRUG ALLERGIES:  No Known Allergies  VITALS:  Blood pressure 122/72, pulse 99, temperature 97.9 F (36.6 C), temperature source Oral, resp. rate 16, height 6' (1.829 m), weight 98.34 kg (216 lb 12.8 oz), SpO2 99 %.  PHYSICAL EXAMINATION:   Physical Exam  GENERAL:  44 y.o.-year-old patient lying in the bed. Obese. EYES: Pupils equal, round, reactive to light and accommodation. No scleral icterus. Extraocular muscles intact.  HEENT: Head atraumatic, normocephalic. Oropharynx and nasopharynx clear.  NECK:  Supple, no jugular venous distention. No thyroid enlargement, no tenderness.  LUNGS: Normal breath sounds bilaterally, no wheezing, rales, rhonchi. No use of accessory muscles of respiration.  CARDIOVASCULAR: S1, S2 normal. No  murmurs, rubs, or gallops.  ABDOMEN: Soft, nontenderness, nondistended. Bowel sounds present. No organomegaly or mass.  EXTREMITIES: No cyanosis, clubbing. No edema bilateral LE  NEUROLOGIC: Cranial nerves II through XII are intact. No focal Motor or sensory deficits b/l.   PSYCHIATRIC: The patient is awake and alert. Oriented times 3 SKIN: No obvious rash, lesion, or ulcer.   LABORATORY PANEL:   CBC  Recent Labs Lab 06/19/15 0541  WBC 4.3  HGB 7.9*  HCT 24.1*  PLT 152   ------------------------------------------------------------------------------------------------------------------  Chemistries   Recent Labs Lab 06/20/15 0511  NA 139  K 4.2  CL 112*  CO2 23  GLUCOSE 93  BUN 9  CREATININE 0.65  CALCIUM 8.2*  MG 1.4*   ------------------------------------------------------------------------------------------------------------------  Cardiac Enzymes No results for input(s): TROPONINI in the last 168 hours. ------------------------------------------------------------------------------------------------------------------  RADIOLOGY:  No results found.  ASSESSMENT AND PLAN:   44 m with alcohol abuse, seizures, hepatic encephalopathy here with diarrhea  * C diff colitis.  IV flagyl and PO vancomycin was discontinued. Continue deficid (8 days so far) and cholestyramine. Avoid other abx. repeat c diff  Negative. C.diff stools study now neg x 2. No plan for stool transplant and endoscopy, start imodium 2 gr q8 hr and cont dificid to complete 14 day course per Dr. Rayann Heman.   * septic and hypovolemic shock - Resolved  * Hypokalemia - improved. continue KCl.   * Hypomagnesemia. Still low.  Mag iv, f/u level.  * Alcohol abuse.  on CIWA- no signs of withdrawal.  stopped.  * Acute encephalopathy,  Due to acute illness. Resolved.  On Rifaximin.  * Hem positive blood with anemia of chronic disease - likely from c diff Hemoglobin decreased to 6.1 ,  got 2 units of PRBC  transfusion, hemoglobin is stable.  No endoscopy plan.  * Acute renal failure due to ATN- Resolved.  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 follow with a neurologist as outpatient.  All the records are reviewed and case discussed with Care Management/Social Workerr. Management plans discussed with the patient, RN and they are in agreement.  CODE STATUS: FULL  DVT Prophylaxis: SCDs. No heparin due to blood in stool  TOTAL TIME TAKING CARE OF THIS PATIENT: 35  minutes.   Possible discharge home in 2 days.   Demetrios Loll M.D on 06/20/2015 at 11:46 AM  Between 7am to 6pm - Pager - [phone]  After 6pm go to [email or web address] - password [email or web address] [facility]  [facility]  Office  [phone]  CC: Primary care physician; No primary care provider on file.

## 2015-06-20 NOTE — Plan of Care (Signed)
Problem: Discharge Progression Outcomes Goal: Activity appropriate for discharge plan Outcome: Progressing Patient up out of bed using bedside commode today with minimal assistance, worked with PT today was able to walk from bed to door three times.  Continues to get imodium and stools a lot less frequent today.  Continues to tolerate diet, and VSS possible discharge tomorrow.

## 2015-06-20 NOTE — Plan of Care (Signed)
Problem: Discharge Progression Outcomes Goal: Discharge plan in place and appropriate Individualization  Outcome: Progressing Pt answers to Troy [name] or Troy [name]. Pt lives at home with wife and 2 grandchildren.    Goal: Other Discharge Outcomes/Goals Outcome: Progressing Plan of care progress to goal: Pain - pain controlled with oxycodone,  Pt states pain is gaseous and crampy Hemodynamically stable - vitals stable this shift Complications - pt refuses to get out of bed to use the bathroom for fear of a stool incontinent episode Activity - pt remains in bed

## 2015-06-20 NOTE — Progress Notes (Signed)
Physical Therapy Treatment Patient Details Name: Troy [name] MRN: [id number] DOB: 15-Aug-1971 Today's Date: 06/20/2015    History of Present Illness Troy [name] is a 44 y.o. male with a history of liver disease as well as alcoholismrecently discharged from the hospital on [address] for sepsis believed to be from spontaneous bacterial peritonitis, presents to the hospital with uncontrolled diarrhea. The patient says that he was having diarrhea last time he was in the hospital but it has worsened. He called the fire department to help him get a shower because the diarrhea had become so far out of control. Per the medics, the fire chief called the Department of Social Services but did not receive a call back from social services. It is unclear if a formal report has been filed at this time. Patient then called back to EMS because he wasn't able to walk. This was after he had driven himself to the liquor store and brought vodka and drank about half the bottle. Per EMS, he had a trail of feces trailing him out of the liquor store. Also per EMS, they say that his house is uninhabitable because of the amount of stool covering the surfaces. They said that his family has left because of the amount of fecal matter. The patient is not complaining of any pain at this time. However, his history and physical is confounded by a decreased mental status. Possibly secondary to sepsis or alcohol intoxication.    PT Comments    Pt willing to participate with PT today and initially requiring assist transferring bed to commode without AD but once pt started using RW, pt was CGA to SBA with transfers and CGA with ambulation in room.  Pt appears to be progressing with functional mobility and anticipate pt will be able to discharge home with HHPT (pt already owns RW to use).  Follow Up Recommendations  Home health PT     Equipment Recommendations  None recommended by PT    Recommendations for Other Services        Precautions / Restrictions Precautions Precautions: Fall Precaution Comments: Enteric Restrictions Weight Bearing Restrictions: No    Mobility  Bed Mobility   Bed Mobility: Supine to Sit;Sit to Supine   Sidelying to sit: Supervision Supine to sit: Supervision Sit to supine: Supervision      Transfers Overall transfer level: Needs assistance Equipment used: Rolling walker (2 wheeled);None Transfers: Sit to/from [facility] Sit to Stand:  (min assist no AD; CGA to SBA with RW) Stand pivot transfers:  (transfer bed to commode)       General transfer comment: Wide BOS but steady; good hand placement and sequencing.  Ambulation/Gait Ambulation/Gait assistance: Min guard Ambulation Distance (Feet): 100 Feet (in room) Assistive device: Rolling walker (2 wheeled)       General Gait Details: Initial decreased cadence and foot clearance but improved with distance   Stairs            Wheelchair Mobility    Modified Rankin (Stroke Patients Only)       Balance   Sitting-balance support: No upper extremity supported;Feet supported Sitting balance-Leahy Scale: Good     Standing balance support: Bilateral upper extremity supported Standing balance-Leahy Scale: Good                      Cognition Arousal/Alertness: Awake/alert Behavior During Therapy: WFL for tasks assessed/performed Overall Cognitive Status: Within Functional Limits for tasks assessed  Exercises      General Comments  Nursing cleared pt for participation in physical therapy.  Pt agreeable to PT session.      Pertinent Vitals/Pain Pain Assessment: No/denies pain  HR increased to 100 bpm after ambulation.    Home Living                      Prior Function            PT Goals (current goals can now be found in the care plan section) Acute Rehab PT Goals Patient Stated Goal: "I would like to return home" PT Goal  Formulation: With patient Time For Goal Achievement: 06/27/15 Potential to Achieve Goals: Good Progress towards PT goals: Progressing toward goals    Frequency  Min 2X/week    PT Plan Current plan remains appropriate    Co-evaluation             End of Session Equipment Utilized During Treatment: Gait belt Activity Tolerance: Patient limited by fatigue Patient left: in bed;with call bell/phone within reach;with bed alarm set;with nursing/sitter in room     Time: 1535-1600 PT Time Calculation (min) (ACUTE ONLY): 25 min  Charges:  $Therapeutic Activity: 23-37 mins                    G CodesLeitha Bleak July 11, 2015, 4:45 PM [name], [place]

## 2015-06-21 LAB — CBC
HCT: 25.7 % — ABNORMAL LOW (ref 40.0–52.0)
HEMOGLOBIN: 8.4 g/dL — AB (ref 13.0–18.0)
MCH: 32.3 pg (ref 26.0–34.0)
MCHC: 32.6 g/dL (ref 32.0–36.0)
MCV: 99.2 fL (ref 80.0–100.0)
Platelets: 154 10*3/uL (ref 150–440)
RBC: 2.59 MIL/uL — ABNORMAL LOW (ref 4.40–5.90)
RDW: 17 % — ABNORMAL HIGH (ref 11.5–14.5)
WBC: 5.6 10*3/uL (ref 3.8–10.6)

## 2015-06-21 LAB — BASIC METABOLIC PANEL
ANION GAP: 5 (ref 5–15)
BUN: 8 mg/dL (ref 6–20)
CALCIUM: 8.5 mg/dL — AB (ref 8.9–10.3)
CO2: 26 mmol/L (ref 22–32)
Chloride: 110 mmol/L (ref 101–111)
Creatinine, Ser: 0.58 mg/dL — ABNORMAL LOW (ref 0.61–1.24)
Glucose, Bld: 102 mg/dL — ABNORMAL HIGH (ref 65–99)
POTASSIUM: 4.3 mmol/L (ref 3.5–5.1)
SODIUM: 141 mmol/L (ref 135–145)

## 2015-06-21 LAB — MAGNESIUM: Magnesium: 1.4 mg/dL — ABNORMAL LOW (ref 1.7–2.4)

## 2015-06-21 MED ORDER — SACCHAROMYCES BOULARDII 250 MG PO CAPS: 250.0000 mg | ORAL_CAPSULE | Freq: Two times a day (BID) | ORAL | Status: AC

## 2015-06-21 MED ORDER — MAGNESIUM SULFATE 4 GM/100ML IV SOLN
4.0000 g | Freq: Once | INTRAVENOUS | Status: AC
  Administered 2015-06-21: 4 g via INTRAVENOUS
  Filled 2015-06-21: qty 100

## 2015-06-21 MED ORDER — PANTOPRAZOLE SODIUM 40 MG PO TBEC: 40.0000 mg | DELAYED_RELEASE_TABLET | Freq: Every day | ORAL | Status: AC

## 2015-06-21 MED ORDER — MAGNESIUM 30 MG PO TABS: 30.0000 mg | ORAL_TABLET | Freq: Two times a day (BID) | ORAL | Status: AC

## 2015-06-21 MED ORDER — ZINC OXIDE 20 % EX OINT: TOPICAL_OINTMENT | Freq: Two times a day (BID) | CUTANEOUS | Status: AC | PRN

## 2015-06-21 MED ORDER — LOPERAMIDE HCL 2 MG PO CAPS: 2.0000 mg | ORAL_CAPSULE | Freq: Three times a day (TID) | ORAL | Status: AC

## 2015-06-21 MED ORDER — FIDAXOMICIN 200 MG PO TABS: 200.0000 mg | ORAL_TABLET | Freq: Two times a day (BID) | ORAL | Status: DC

## 2015-06-21 NOTE — Discharge Summary (Signed)
Troy [name] at [place] NAME: Troy [name]    MR#:  [id number]  DATE OF BIRTH:  July 30, 1971  DATE OF ADMISSION:  06/06/2015 ADMITTING PHYSICIAN: Theodoro Grist, MD  DATE OF DISCHARGE: 06/21/2015  PRIMARY CARE PHYSICIAN: No primary care provider on file.    ADMISSION DIAGNOSIS:  Diarrhea [R19.7] Hypokalemia [E87.6] Sepsis, due to unspecified organism [A41.9] Hypotension, unspecified hypotension type [I95.9]   DISCHARGE DIAGNOSIS:  C diff colitis.  septic and hypovolemic shock  Hypokalemia Hypomagnesemia Hem positive blood with anemia of chronic disease Alcohol abuse  SECONDARY DIAGNOSIS:   Past Medical History  Diagnosis Date  . Liver damage   . Hypertension   . Neuropathy   . Seizures     complicated ETOH withdrawal  . Pancytopenia, acquired   . Ascites due to alcoholic cirrhosis   . DTs (delirium tremens)   . Hypothyroidism   . Hepatitis C     HOSPITAL COURSE:  The patient was admitted for sepsis with diarrhea and dehydration. He was treated with the vancomycin and Zosyn for sepsis. In addition lactulose was discontinued. Stool C. difficile test is positive.  * C diff colitis.  He was treated with IV flagyl and  PO vancomycin, but as he continuously have diarrhea on daily basis, so Flagyl and vancomycin were discontinued.Deficid and cholestyramine were started. The patient still has diarrhea multiple times a day, and Dr. Rayann Heman plans to get the stool transplant and endoscopy,  but repeated C. difficile test were negative twice. Dr. Rayann Heman suggested no stool transplant or endoscopy at this time and started imodium 2 gr q8 hr and contiue dificid to complete 14 day course. Diarrhea is getting better for the past 2 days.   * septic and hypovolemic shock - Resolved after IV fluid to support.  * Hypokalemia - improved on potassium supplements.  * Hypomagnesemia. The patient got Mag iv due to low magnesium level. Magnesium level  today is 1.4. He was given IV magnesium 4 g 1 dose and need to continue by mouth magnesium twice a day, follow-up magnesium level as outpatient.  * Alcohol abuse. He was on CIWA, but no signs of withdrawal.  * Acute encephalopathy, Due to acute illness. Resolved. On Rifaximin.  * Hem positive blood with anemia of chronic disease - likely from c diff Hemoglobin decreased to 6.1, he got 2 units of PRBC transfusion, hemoglobin is stable. No endoscopy plan [[er GI.  * Acute renal failure due to ATN- Resolved after IV fluid to support.  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  * Lower legs weakness  He has neuropathy and atrophy for more than a year. follow with a neurologist as outpatient. Continue PT.  DISCHARGE CONDITIONS:   Stable. Dischcharge to home with HHPT today.  CONSULTS OBTAINED:  Treatment Team:  Josefine Class, MD Demetrios Loll, MD  DRUG ALLERGIES:  No Known Allergies  DISCHARGE MEDICATIONS:   Current Discharge Medication List    START taking these medications   Details  fidaxomicin (DIFICID) 200 MG TABS tablet Take 1 tablet (200 mg total) by mouth 2 (two) times daily. Qty: 10 tablet, Refills: 0    loperamide (IMODIUM) 2 MG capsule Take 1 capsule (2 mg total) by mouth 3 (three) times daily. Qty: 30 capsule, Refills: 0    magnesium 30 MG tablet Take 1 tablet (30 mg total) by mouth 2 (two) times daily. Qty: 14 tablet, Refills: 0    pantoprazole (PROTONIX) 40  MG tablet Take 1 tablet (40 mg total) by mouth daily. Qty: 60 tablet, Refills: 0    saccharomyces boulardii (FLORASTOR) 250 MG capsule Take 1 capsule (250 mg total) by mouth 2 (two) times daily. Qty: 20 capsule, Refills: 0    zinc oxide 20 % ointment Apply topically 2 (two) times daily as needed for irritation. Apply to button Qty: 56.7 g, Refills: 0      CONTINUE these medications which have NOT CHANGED   Details  folic acid (FOLVITE) 1 MG tablet Take 1 tablet (1 mg  total) by mouth daily. Qty: 30 tablet, Refills: 0    furosemide (LASIX) 40 MG tablet Take 1 tablet (40 mg total) by mouth daily. Qty: 30 tablet, Refills: 0    nadolol (CORGARD) 40 MG tablet Take 1 tablet (40 mg total) by mouth daily. Qty: 30 tablet, Refills: 0    potassium chloride SA (K-DUR,KLOR-CON) 20 MEQ tablet Take 2 tablets (40 mEq total) by mouth 2 (two) times daily. Qty: 20 tablet, Refills: 0    pregabalin (LYRICA) 150 MG capsule Take 150 mg by mouth 2 (two) times daily as needed (for nerve pain).    QUEtiapine (SEROQUEL) 50 MG tablet Take 50 mg by mouth at bedtime as needed (for sleep).    thiamine 100 MG tablet Take 1 tablet (100 mg total) by mouth daily. Qty: 30 tablet, Refills: 0      STOP taking these medications     allopurinol (ZYLOPRIM) 300 MG tablet      gabapentin (NEURONTIN) 100 MG capsule      levothyroxine (SYNTHROID, LEVOTHROID) 150 MCG tablet          DISCHARGE INSTRUCTIONS:    If you experience worsening of your admission symptoms, develop shortness of breath, life threatening emergency, suicidal or homicidal thoughts you must seek medical attention immediately by calling 911 or calling your MD immediately  if symptoms less severe.  You Must read complete instructions/literature along with all the possible adverse reactions/side effects for all the Medicines you take and that have been prescribed to you. Take any new Medicines after you have completely understood and accept all the possible adverse reactions/side effects.   Please note  You were cared for by a hospitalist during your hospital stay. If you have any questions about your discharge medications or the care you received while you were in the hospital after you are discharged, you can call the unit and asked to speak with the hospitalist on call if the hospitalist that took care of you is not available. Once you are discharged, your primary care physician will handle any further medical  issues. Please note that NO REFILLS for any discharge medications will be authorized once you are discharged, as it is imperative that you return to your primary care physician (or establish a relationship with a primary care physician if you do not have one) for your aftercare needs so that they can reassess your need for medications and monitor your lab values.    Today   SUBJECTIVE    Diarrhea once today.  VITAL SIGNS:  Blood pressure 120/76, pulse 83, temperature 98 F (36.7 C), temperature source Oral, resp. rate 18, height 6' (1.829 m), weight 98.34 kg (216 lb 12.8 oz), SpO2 100 %.  I/O:   Intake/Output Summary (Last 24 hours) at 06/21/15 1316 Last data filed at 06/21/15 0907  Gross per 24 hour  Intake      0 ml  Output   1280 ml  Net  -1280 ml    PHYSICAL EXAMINATION:  GENERAL:  44 y.o.-year-old patient lying in the bed with no acute distress. Obese. EYES: Pupils equal, round, reactive to light and accommodation. No scleral icterus. Extraocular muscles intact.  HEENT: Head atraumatic, normocephalic. Oropharynx and nasopharynx clear.  NECK:  Supple, no jugular venous distention. No thyroid enlargement, no tenderness.  LUNGS: Normal breath sounds bilaterally, no wheezing, rales,rhonchi or crepitation. No use of accessory muscles of respiration.  CARDIOVASCULAR: S1, S2 normal. No murmurs, rubs, or gallops.  ABDOMEN: Soft, non-tender, non-distended. Bowel sounds present. No organomegaly or mass.  EXTREMITIES: No pedal edema, cyanosis, or clubbing.  NEUROLOGIC: Cranial nerves II through XII are intact. Muscle strength 5/5 in all extremities. Sensation intact. Gait not checked.  PSYCHIATRIC: The patient is alert and oriented x 3.  SKIN: No obvious rash, lesion, or ulcer.   DATA REVIEW:   CBC  Recent Labs Lab 06/21/15 0523  WBC 5.6  HGB 8.4*  HCT 25.7*  PLT 154    Chemistries   Recent Labs Lab 06/21/15 0523  NA 141  K 4.3  CL 110  CO2 26  GLUCOSE 102*  BUN  8  CREATININE 0.58*  CALCIUM 8.5*  MG 1.4*    Cardiac Enzymes No results for input(s): TROPONINI in the last 168 hours.  Microbiology Results  Results for orders placed or performed during the hospital encounter of 06/06/15  Blood Culture (routine x 2)     Status: None   Collection Time: 06/06/15  4:14 PM  Result Value Ref Range Status   Specimen Description BLOOD  Final   Special Requests NONE  Final   Culture NO GROWTH 5 DAYS  Final   Report Status 06/11/2015 FINAL  Final  Stool culture     Status: None   Collection Time: 06/06/15  4:14 PM  Result Value Ref Range Status   Specimen Description STOOL  Final   Special Requests Normal  Final   Culture   Final    NO SALMONELLA OR SHIGELLA ISOLATED No Pathogenic E. coli detected    Report Status 06/10/2015 FINAL  Final  C difficile quick scan w PCR reflex ([facility] only)     Status: None   Collection Time: 06/06/15  4:20 PM  Result Value Ref Range Status   C Diff antigen POSITIVE  Final   C Diff toxin NEGATIVE  Final   C Diff interpretation   Final    Positive for toxigenic C. difficile, active toxin production not detected. Patient has toxigenic C. difficile organisms present in the bowel, but toxin was not detected. The patient may be a carrier or the level of toxin in the sample was below the limit  of detection. This information should be used in conjunction with the patient's clinical history when deciding on possible therapy.     Comment: CRITICAL RESULT CALLED TO, READ BACK BY AND VERIFIED WITH: LISA THOMPSON 06/06/15 AT 2033.SDR   Blood Culture (routine x 2)     Status: None   Collection Time: 06/06/15  4:20 PM  Result Value Ref Range Status   Specimen Description BLOOD  Final   Special Requests NONE  Final   Culture  Setup Time   Final    GRAM POSITIVE RODS ANAEROBIC BOTTLE ONLY CRITICAL RESULT CALLED TO, READ BACK BY AND VERIFIED WITH: Kathi Simpers AT 3536 06/07/15 DV    Culture   Final    CLOSTRIDIUM  PARAPUTRIFICUM ANAEROBIC BOTTLE ONLY COAGULASE NEGATIVE STAPHYLOCOCCUS AEROBIC BOTTLE  ONLY POSSIBLE CONTAMINATION WITH SKIN FLORA     Report Status 06/12/2015 FINAL  Final   Organism ID, Bacteria CLOSTRIDIUM PARAPUTRIFICUM  Final  Clostridium Difficile by PCR (not at [facility])     Status: Abnormal   Collection Time: 06/06/15  4:20 PM  Result Value Ref Range Status   C difficile by pcr POSITIVE (A) NEGATIVE Final    Comment: Positive for toxigenic C. difficile, active toxin production not detected. Patient has toxigenic C. difficile organisms present in the bowel, but toxin was not detected. The patient may be a carrier or the level of toxin in the sample was below the limit  of detection. This information should be used in conjunction with the patient's clinical history when deciding on possible therapy. CRITICAL RESULT CALLED TO, READ BACK BY AND VERIFIED WITH: LISA THOMPSON 06/06/15 2030. SDW   Giardia, EIA; Ova/Parasite     Status: None   Collection Time: 06/06/15  5:09 PM  Result Value Ref Range Status   Ova + Parasite Exam Final report  Final    Comment: (NOTE) These results were obtained using wet preparation(s) and trichrome stained smear. This test does not include testing for Cryptosporidium parvum, Cyclospora, or Microsporidia.    Giardia Ag, Stl Negative Negative Final    Comment: (NOTE) Performed At: [place], [place] [id number] Caprice Red MD [id number] Performed At: [facility] [place], [place] [id number] Lindon Romp MD WN:[id number]    Source of Sample STOOL  Corrected  MRSA PCR Screening     Status: None   Collection Time: 06/06/15  9:00 PM  Result Value Ref Range Status   MRSA by PCR NEGATIVE NEGATIVE Final    Comment:        The GeneXpert MRSA Assay (FDA approved for NASAL specimens only), is one component of a comprehensive MRSA colonization surveillance program. It is not intended to  diagnose MRSA infection nor to guide or monitor treatment for MRSA infections.   Urine culture     Status: None   Collection Time: 06/07/15  3:41 PM  Result Value Ref Range Status   Specimen Description URINE, CLEAN CATCH  Final   Special Requests NONE  Final   Culture NO GROWTH 2 DAYS  Final   Report Status 06/09/2015 FINAL  Final  C difficile quick scan w PCR reflex ([facility] only)     Status: None   Collection Time: 06/12/15  4:21 PM  Result Value Ref Range Status   C Diff antigen NEGATIVE  Final   C Diff toxin NEGATIVE  Final   C Diff interpretation Negative for C. difficile  Final  C difficile quick scan w PCR reflex ([facility] only)     Status: None   Collection Time: 06/18/15 10:30 PM  Result Value Ref Range Status   C Diff antigen NEGATIVE  Final   C Diff toxin NEGATIVE  Final   C Diff interpretation Negative for C. difficile  Final    RADIOLOGY:  No results found.      Management plans discussed with the patient, family and they are in agreement.  CODE STATUS:     Code Status Orders        Start     Ordered   06/06/15 2116  Full code   Continuous     06/06/15 2116      TOTAL TIME TAKING CARE OF THIS PATIENT: 43 minutes.    Demetrios Loll M.D on  06/21/2015 at 1:16 PM  Between 7am to 6pm - Pager - [phone]  After 6pm go to [email or web address] - password [email or web address] [facility]  [facility]  Office  [phone]  CC: Primary care physician; No primary care provider on file.

## 2015-06-21 NOTE — Plan of Care (Signed)
Problem: Problem: Bowel/Bladder Progression Goal: PATIENT IS CONTINENT Outcome: Progressing No loose stools reported to RN or CNA, complains of generalized abd pain

## 2015-06-21 NOTE — Care Management (Signed)
Discharge to home today per Dr. Bridgett Larsson. Physical therapy recommended Home health/physical therapy. Spoke with Troy [name] at the bedside. States he would rather go to [facility] as an outpatient. States he was suppose to go to [facility] after last discharge from this facility in May. Discussed that he would need to call [facility] Physical therapy when he got home an arrange an appointment with them. States his friend, Troy [name], will be transporting him home today. Shelbie Ammons RN MSN Care Management [phone]

## 2015-06-21 NOTE — Progress Notes (Signed)
GI Inpatient Follow-up Note  Patient Identification: Troy [name] is a 44 y.o. male with resovling c.diff diarrhea, cirrhosis  Subjective:  Nursing staff report only one stool overnight but Troy [name] still reporting multiple loose stools.   + ABd pain, no f/c. No blood in stools.   Scheduled Inpatient Medications:  . allopurinol  300 mg Oral Daily  . cholestyramine light  4 g Oral TID  . fidaxomicin  200 mg Oral BID  . folic acid  1 mg Oral Daily  . levothyroxine  175 mcg Oral QAC breakfast  . loperamide  2 mg Oral TID  . metoprolol tartrate  25 mg Oral BID  . pantoprazole  40 mg Oral BID  . potassium chloride  40 mEq Oral BID  . saccharomyces boulardii  250 mg Oral BID  . sodium chloride  3 mL Intravenous Q12H  . thiamine  100 mg Oral Daily  . Vitamin D (Ergocalciferol)  50,000 Units Oral Q7 days  . zinc oxide   Topical BID    Continuous Inpatient Infusions:     PRN Inpatient Medications:  [DISCONTINUED] acetaminophen **OR** acetaminophen, gabapentin, labetalol, liver oil-zinc oxide, oxyCODONE, pregabalin, QUEtiapine   Physical Examination: BP 120/76 mmHg  Pulse 83  Temp(Src) 98 F (36.7 C) (Oral)  Resp 18  Ht 6' (1.829 m)  Wt 98.34 kg (216 lb 12.8 oz)  BMI 29.40 kg/m2  SpO2 100% Gen: NAD, alert and oriented x 3 HEENT: PEERLA, EOMI, Neck: supple, no JVD or thyromegaly Chest: CTA bilaterally, no wheezes, crackles, or other adventitious sounds CV: RRR, no m/g/c/r Abd: + obese abd,mild distension, nabs.  Ext: no edema, well perfused with 2+ pulses, Skin: no rash or lesions noted Lymph: no LAD  Data: Lab Results  Component Value Date   WBC 5.6 06/21/2015   HGB 8.4* 06/21/2015   HCT 25.7* 06/21/2015   MCV 99.2 06/21/2015   PLT 154 06/21/2015    Recent Labs Lab 06/18/15 0629 06/19/15 0541 06/21/15 0523  HGB 7.7* 7.9* 8.4*   Lab Results  Component Value Date   NA 141 06/21/2015   K 4.3 06/21/2015   CL 110 06/21/2015   CO2 26 06/21/2015   BUN 8  06/21/2015   CREATININE 0.58* 06/21/2015   Lab Results  Component Value Date   ALT 11* 06/08/2015   AST 41 06/08/2015   ALKPHOS 201* 06/08/2015   BILITOT 1.2 06/08/2015    Recent Labs Lab 06/14/15 1736  INR 1.31   Assessment/Plan: Troy. [name] is a 44 y.o. male with resolving c.diff diarrhea.  Per nursing staff, diarrhea resolved.   Recommendations: - cont dificid for 14 days - start probiotic - cont cholestyramine, imodium for now as stools may continue on losoe side due to post infectious IBS.  - GI clinic f/u.  - needs to stop all ETOH.   Please call with questions or concerns.  REIN, Grace Blight, MD

## 2015-06-21 NOTE — Clinical Social Work Note (Signed)
Pt is ready for discharge today. Pt will return home. CSW updated [employer] APS worker of discharge specifics. CSW is signing off as no further needs identified.   Darden Dates, MSW, LCSW Clinical Social Worker  [phone]

## 2015-06-21 NOTE — Discharge Instructions (Signed)
Low sodium and low fat diet. Activity as tolerated.  HHPT Clostridium Difficile Toxin This is a test which may be done when a patient has diarrhea that lasts for several days, or has abdominal pain, fever, and nausea after antibiotic therapy.  This test looks for the presence of Clostridium difficile (C.diff.) toxin in a stool sample. C.diff. is a germ (bacterium) that is one of the groups of bacteria that are usually in the colon, called "normal flora." If something upsets the growth of the other normal flora, C.diff. may overgrow and disrupt the balance of bacteria in the colon. C. diff. may produce two toxins, A and B. The combination of overgrowth and toxins may cause prolonged diarrhea. The toxins may damage the lining of the colon and lead to colitis.  While some cases of C. diff. diarrhea and colitis do not require treatment, others require specific oral antibiotic therapy. Most patients improve as the normal flora re-establishes itself, but about some may have one or more relapses, with symptoms and detectible toxin levels coming back. PREPARATION FOR TEST There is no special preparation for the test. A fresh stool sample is collected in a sterile container. The sample should not be mixed with urine or water. The stool should be taken to the lab within an hour. It may be refrigerated or frozen and taken to the lab as soon as possible. The container should be labeled with your name and the date and time of the stool collection.  NORMAL FINDINGS Negative Tissue Culture (no toxin identified) Ranges for normal findings may vary among different laboratories and hospitals. You should always check with your doctor after having lab work or other tests done to discuss the meaning of your test results and whether your values are considered within normal limits. MEANING OF TEST  Your caregiver will go over the test results with you and discuss the importance and meaning of your results, as well as  treatment options and the need for additional tests if necessary. OBTAINING THE TEST RESULTS It is your responsibility to obtain your test results. Ask the lab or department performing the test when and how you will get your results. Document Released: 12/24/2004 Document Revised: 02/23/2012 Document Reviewed: 11/09/2008 [facility] Patient Information 2015 [place], [place]. This information is not intended to replace advice given to you by your health care provider. Make sure you discuss any questions you have with your health care provider.  Bacteremia Bacteremia occurs when bacteria get in your blood. Normal blood does not usually have bacteria. Bacteremia is one way infections can spread from one part of the body to another. CAUSES   Causes may include anything that allows bacteria to get into the body. Examples are:  Catheters.  Intravenous (IV) access tubes.  Cuts or scrapes of the skin.  Temporary bacteremia may occur during dental procedures, while brushing your teeth, or during a bowel movement. This rarely causes any symptoms or medical problems.  Bacteria may also get in the bloodstream as a complication of a bacterial infection elsewhere. This includes infected wounds and bacterial infections of the:  Lungs (pneumonia).  Kidneys (pyelonephritis).  Intestines (enteritis, colitis).  Organs in the abdomen (appendicitis, cholecystitis, diverticulitis). SYMPTOMS  The body is usually able to clear small numbers of bacteria out of the blood quickly. Brief bacteremia usually does not cause problems.   Problems can occur if the bacteria start to grow in number or spread to other parts of the body. If the bacteria start growing, you may  develop:  Chills.  Fever.  Nausea.  Vomiting.  Sweating.  Lightheadedness and low blood pressure.  Pain.  If bacteria start to grow in the linings around the brain, it is called meningitis. This can cause severe headaches, many other  problems, and even death.  If bacteria start to grow in a joint, it causes arthritis with painful joints. If bacteria start to grow in a bone, it is called osteomyelitis.  Bacteria from the blood can also cause sores (abscesses) in many organs, such as the muscle, liver, spleen, lungs, brain, and kidneys. DIAGNOSIS   This condition is diagnosed by cultures of the blood.  Cultures may also be taken from other parts of the body that are thought to be causing the bacteremia. A small piece of tissue, fluid, or other product of the body is sampled. The sample is then put on a growth plate to see if any bacteria grows.  Other lab tests may be done and the results may be abnormal. TREATMENT  Treatment requires a stay in the hospital. You will be given antibiotic medicine through an IV access tube. PREVENTION  People with an increased risk of developing bacteremia or complications may be given antibiotics before certain procedures. Examples are:  A person with a heart murmur or artificial heart valve, before having his or her teeth cleaned.  Before having a surgical or other invasive procedure.  Before having a bowel procedure. Document Released: 09/14/2006 Document Revised: 02/23/2012 Document Reviewed: 06/26/2011 [facility] Patient Information 2015 [place], [place]. This information is not intended to replace advice given to you by your health care provider. Make sure you discuss any questions you have with your health care provider.

## 2015-06-21 NOTE — Progress Notes (Signed)
MD making rounds. Discharge orders received. IV removed.  Prescriptions given to patient. Discharge paperwork provided, explained, signed and witnessed. No unanswered questions. Discharged via wheelchair with nursing staff. Belongings sent with patient and family.

## 2015-06-22 LAB — CELIAC DISEASE PANEL
Endomysial Ab, IgA: NEGATIVE
IgA: 192 mg/dL (ref 90–386)
Tissue Transglutaminase Ab, IgA: <2 U/mL (ref 0–3)

## 2015-06-22 LAB — O&P RESULT

## 2015-06-22 LAB — CHROMOGRANIN A: CHROMOGRANIN A: 16 nmol/L — AB (ref 0–5)

## 2015-06-25 LAB — OVA + PARASITE EXAM

## 2015-07-01 ENCOUNTER — Encounter: Payer: Self-pay | Admitting: *Deleted

## 2015-07-01 ENCOUNTER — Inpatient Hospital Stay
Admission: EM | Admit: 2015-07-01 | Discharge: 2015-07-09 | DRG: 385 | Disposition: A | Discharge status: Skilled Nursing Facility | Payer: 59 | Attending: Internal Medicine | Admitting: Internal Medicine

## 2015-07-01 ENCOUNTER — Emergency Department: Payer: 59

## 2015-07-01 DIAGNOSIS — R32 Unspecified urinary incontinence: Secondary | ICD-10-CM | POA: Diagnosis present

## 2015-07-01 DIAGNOSIS — N170 Acute kidney failure with tubular necrosis: Secondary | ICD-10-CM | POA: Diagnosis not present

## 2015-07-01 DIAGNOSIS — Z79899 Other long term (current) drug therapy: Secondary | ICD-10-CM

## 2015-07-01 DIAGNOSIS — K5090 Crohn's disease, unspecified, without complications: Secondary | ICD-10-CM | POA: Diagnosis not present

## 2015-07-01 DIAGNOSIS — N19 Unspecified kidney failure: Secondary | ICD-10-CM

## 2015-07-01 DIAGNOSIS — E875 Hyperkalemia: Secondary | ICD-10-CM | POA: Diagnosis present

## 2015-07-01 DIAGNOSIS — R197 Diarrhea, unspecified: Secondary | ICD-10-CM

## 2015-07-01 DIAGNOSIS — E860 Dehydration: Secondary | ICD-10-CM | POA: Diagnosis present

## 2015-07-01 DIAGNOSIS — S79911A Unspecified injury of right hip, initial encounter: Secondary | ICD-10-CM | POA: Diagnosis not present

## 2015-07-01 DIAGNOSIS — I8500 Esophageal varices without bleeding: Secondary | ICD-10-CM | POA: Diagnosis present

## 2015-07-01 DIAGNOSIS — E876 Hypokalemia: Secondary | ICD-10-CM | POA: Diagnosis present

## 2015-07-01 DIAGNOSIS — R1084 Generalized abdominal pain: Secondary | ICD-10-CM

## 2015-07-01 DIAGNOSIS — K589 Irritable bowel syndrome without diarrhea: Secondary | ICD-10-CM | POA: Diagnosis present

## 2015-07-01 DIAGNOSIS — R112 Nausea with vomiting, unspecified: Secondary | ICD-10-CM

## 2015-07-01 DIAGNOSIS — Z87891 Personal history of nicotine dependence: Secondary | ICD-10-CM

## 2015-07-01 DIAGNOSIS — Z6834 Body mass index (BMI) 34.0-34.9, adult: Secondary | ICD-10-CM

## 2015-07-01 DIAGNOSIS — D72829 Elevated white blood cell count, unspecified: Secondary | ICD-10-CM

## 2015-07-01 DIAGNOSIS — F10239 Alcohol dependence with withdrawal, unspecified: Secondary | ICD-10-CM | POA: Diagnosis present

## 2015-07-01 DIAGNOSIS — Y92009 Unspecified place in unspecified non-institutional (private) residence as the place of occurrence of the external cause: Secondary | ICD-10-CM

## 2015-07-01 DIAGNOSIS — K259 Gastric ulcer, unspecified as acute or chronic, without hemorrhage or perforation: Secondary | ICD-10-CM | POA: Diagnosis present

## 2015-07-01 DIAGNOSIS — L309 Dermatitis, unspecified: Secondary | ICD-10-CM | POA: Diagnosis not present

## 2015-07-01 DIAGNOSIS — R627 Adult failure to thrive: Secondary | ICD-10-CM | POA: Diagnosis present

## 2015-07-01 DIAGNOSIS — M879 Osteonecrosis, unspecified: Secondary | ICD-10-CM | POA: Diagnosis present

## 2015-07-01 DIAGNOSIS — E039 Hypothyroidism, unspecified: Secondary | ICD-10-CM | POA: Diagnosis present

## 2015-07-01 DIAGNOSIS — Z96642 Presence of left artificial hip joint: Secondary | ICD-10-CM | POA: Diagnosis present

## 2015-07-01 DIAGNOSIS — E872 Acidosis: Secondary | ICD-10-CM | POA: Diagnosis present

## 2015-07-01 DIAGNOSIS — W19XXXA Unspecified fall, initial encounter: Secondary | ICD-10-CM | POA: Diagnosis present

## 2015-07-01 DIAGNOSIS — D125 Benign neoplasm of sigmoid colon: Secondary | ICD-10-CM | POA: Diagnosis present

## 2015-07-01 DIAGNOSIS — K7030 Alcoholic cirrhosis of liver without ascites: Secondary | ICD-10-CM | POA: Diagnosis present

## 2015-07-01 DIAGNOSIS — D649 Anemia, unspecified: Secondary | ICD-10-CM | POA: Diagnosis present

## 2015-07-01 DIAGNOSIS — I10 Essential (primary) hypertension: Secondary | ICD-10-CM | POA: Diagnosis present

## 2015-07-01 DIAGNOSIS — A419 Sepsis, unspecified organism: Secondary | ICD-10-CM | POA: Diagnosis present

## 2015-07-01 DIAGNOSIS — D696 Thrombocytopenia, unspecified: Secondary | ICD-10-CM | POA: Diagnosis present

## 2015-07-01 LAB — COMPREHENSIVE METABOLIC PANEL
ALT: 15 U/L — ABNORMAL LOW (ref 17–63)
ANION GAP: 18 — AB (ref 5–15)
AST: 60 U/L — ABNORMAL HIGH (ref 15–41)
Albumin: 3.1 g/dL — ABNORMAL LOW (ref 3.5–5.0)
Alkaline Phosphatase: 189 U/L — ABNORMAL HIGH (ref 38–126)
BILIRUBIN TOTAL: 2.8 mg/dL — AB (ref 0.3–1.2)
BUN: 16 mg/dL (ref 6–20)
CALCIUM: 7.9 mg/dL — AB (ref 8.9–10.3)
CHLORIDE: 94 mmol/L — AB (ref 101–111)
CO2: 25 mmol/L (ref 22–32)
CREATININE: 0.83 mg/dL (ref 0.61–1.24)
GLUCOSE: 148 mg/dL — AB (ref 65–99)
Potassium: 3 mmol/L — ABNORMAL LOW (ref 3.5–5.1)
Sodium: 137 mmol/L (ref 135–145)
Total Protein: 6.6 g/dL (ref 6.5–8.1)

## 2015-07-01 LAB — CBC
HCT: 32.3 % — ABNORMAL LOW (ref 40.0–52.0)
Hemoglobin: 10.7 g/dL — ABNORMAL LOW (ref 13.0–18.0)
MCH: 31.1 pg (ref 26.0–34.0)
MCHC: 33.3 g/dL (ref 32.0–36.0)
MCV: 93.5 fL (ref 80.0–100.0)
Platelets: 122 10*3/uL — ABNORMAL LOW (ref 150–440)
RBC: 3.45 MIL/uL — AB (ref 4.40–5.90)
RDW: 14.8 % — ABNORMAL HIGH (ref 11.5–14.5)
WBC: 16.1 10*3/uL — AB (ref 3.8–10.6)

## 2015-07-01 LAB — ETHANOL

## 2015-07-01 LAB — LACTIC ACID, PLASMA: Lactic Acid, Venous: 2.8 mmol/L (ref 0.5–2.0)

## 2015-07-01 LAB — MAGNESIUM: Magnesium: 0.8 mg/dL — CL (ref 1.7–2.4)

## 2015-07-01 LAB — TROPONIN I: TROPONIN I: 0.03 ng/mL (ref <0.031)

## 2015-07-01 LAB — LIPASE, BLOOD: LIPASE: 13 U/L — AB (ref 22–51)

## 2015-07-01 MED ORDER — ONDANSETRON HCL 4 MG/2ML IJ SOLN
4.0000 mg | Freq: Once | INTRAMUSCULAR | Status: AC
  Administered 2015-07-01: 4 mg via INTRAVENOUS
  Filled 2015-07-01: qty 2

## 2015-07-01 MED ORDER — LORAZEPAM 2 MG/ML IJ SOLN
1.0000 mg | Freq: Once | INTRAMUSCULAR | Status: AC
  Administered 2015-07-01: 1 mg via INTRAVENOUS
  Filled 2015-07-01: qty 1

## 2015-07-01 MED ORDER — SODIUM CHLORIDE 0.9 % IV SOLN
1000.0000 mL | Freq: Once | INTRAVENOUS | Status: AC
  Administered 2015-07-01: 1000 mL via INTRAVENOUS

## 2015-07-01 MED ORDER — LORAZEPAM 2 MG/ML IJ SOLN: 1.0000 mg | Freq: Once | INTRAMUSCULAR | Status: DC

## 2015-07-01 NOTE — ED Notes (Signed)
Pt arrived via EMS from home after a fall last night. Pt reports lying on floor until calling EMS this evening. Pt reports suddenly going weak and denies hitting head at this time. Pt reports bilateral leg pain and bilateral foot pain that has worsened since the fall. Pt has hx of neuropathy and atrophy of both legs. Pt seen in hospital Monday and diagnosed with C-diff.

## 2015-07-01 NOTE — ED Notes (Signed)
Pt arrived with urine and feces covering body. Pt smelled foul. Clumps of leaves and animal hair where on pt. Pt taken to decon showers by multiple staff to be cleaned. Pt assisted from bed into chair and assisted with bath. Pt verbalized inability to stand or sit in a chair but with assistance was able to do both. Pt gagging and coughing twice throughout the process. Pt vomiting clear sputum after multiple dry heaves. Pt given a clean gown and warm blanket.

## 2015-07-01 NOTE — ED Provider Notes (Signed)
[facility] Emergency Department Provider Note  ____________________________________________  Time seen: On arrival, via EMS  I have reviewed the triage vital signs and the nursing notes.   HISTORY  Chief Complaint Fall  Patient with confusion, history is limited  HPI Troy [name] is a 44 y.o. male with a complicated medical history inclusive of severe alcoholism, hepatic encephalopathy, several admissions to the hospital for possible sepsis, recently admitted for sepsis thought to be due to C. difficile colitis but apparently had negative C. difficile tests in the hospital and it is believed that he has fecal incontinence. He presents after a fall with complaints of nausea and diarrhea. 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 He reports he has not had any alcohol in 6 days     Past Medical History  Diagnosis Date  . Liver damage   . Hypertension   . Neuropathy   . Seizures     complicated ETOH withdrawal  . Pancytopenia, acquired   . Ascites due to alcoholic cirrhosis   . DTs (delirium tremens)   . Hypothyroidism   . Hepatitis C     Patient Active Problem List   Diagnosis Date Noted  . Hypocalcemia 06/09/2015  . Protein-calorie malnutrition, severe 06/09/2015  . Hypokalemia 06/07/2015  . Enteritis due to Clostridium difficile 06/07/2015  . Anemia of chronic disease 06/07/2015  . Hyponatremia 06/07/2015  . Sepsis 06/06/2015  . Acute renal failure 06/06/2015  . Alcohol withdrawal seizure 05/13/2015  . Lactic acidosis 05/13/2015  . Leukocytosis 05/13/2015    Past Surgical History  Procedure Laterality Date  . Joint replacement      Left hip replacement    Current Outpatient Rx  Name  Route  Sig  Dispense  Refill  . fidaxomicin (DIFICID) 200 MG TABS tablet   Oral   Take 1 tablet (200 mg total) by mouth 2 (two) times daily.   10 tablet   0   . folic  acid (FOLVITE) 1 MG tablet   Oral   Take 1 tablet (1 mg total) by mouth daily.   30 tablet   0   . furosemide (LASIX) 40 MG tablet   Oral   Take 1 tablet (40 mg total) by mouth daily.   30 tablet   0   . loperamide (IMODIUM) 2 MG capsule   Oral   Take 1 capsule (2 mg total) by mouth 3 (three) times daily.   30 capsule   0   . magnesium 30 MG tablet   Oral   Take 1 tablet (30 mg total) by mouth 2 (two) times daily.   14 tablet   0   . nadolol (CORGARD) 40 MG tablet   Oral   Take 1 tablet (40 mg total) by mouth daily.   30 tablet   0   . pantoprazole (PROTONIX) 40 MG tablet   Oral   Take 1 tablet (40 mg total) by mouth daily.   60 tablet   0   . potassium chloride SA (K-DUR,KLOR-CON) 20 MEQ tablet   Oral   Take 2 tablets (40 mEq total) by mouth 2 (two) times daily.   20 tablet   0   . pregabalin (LYRICA) 150 MG capsule   Oral   Take 150 mg by mouth 2 (two) times daily as needed (for nerve pain).         Marland Kitchen  QUEtiapine (SEROQUEL) 50 MG tablet   Oral   Take 50 mg by mouth at bedtime as needed (for sleep).         Marland Kitchen saccharomyces boulardii (FLORASTOR) 250 MG capsule   Oral   Take 1 capsule (250 mg total) by mouth 2 (two) times daily.   20 capsule   0   . thiamine 100 MG tablet   Oral   Take 1 tablet (100 mg total) by mouth daily.   30 tablet   0   . zinc oxide 20 % ointment   Topical   Apply topically 2 (two) times daily as needed for irritation. Apply to button   56.7 g   0     Allergies Review of patient's allergies indicates no known allergies.  Family History  Problem Relation Age of Onset  . Crohn's disease Father     Social History History  Substance Use Topics  . Smoking status: Former Research scientist (life sciences)  . Smokeless tobacco: Not on file  . Alcohol Use: 0.0 oz/week    0 Standard drinks or equivalent per week     Comment: hx of chronic ETOH abuse. Pint a day.     Review of Systems  Constitutional: Negative for fever. Eyes: Negative for  visual changes. ENT: Negative for sore throat Cardiovascular: Negative for chest pain. Respiratory: Negative for shortness of breath. Gastrointestinal: Negative for abdominal pain. As a for nausea and diarrhea Genitourinary: Negative for dysuria. Musculoskeletal: Negative for back pain. Skin: Negative for rash. Neurological: Negative for headaches or focal weakness Psychiatric: Positive for anxiety  10-point ROS otherwise negative.  ____________________________________________   PHYSICAL EXAM:  VITAL SIGNS: BP 137/88 mmHg  Pulse 114  Temp(Src) 98.1 F (36.7 C) (Oral)  Resp 21  Ht 5\' 8"  (1.727 m)  Wt 230 lb (104.327 kg)  BMI 34.98 kg/m2  SpO2 94%  ED Triage Vitals  Enc Vitals Group     BP --      Pulse --      Resp --      Temp --      Temp src --      SpO2 --      Weight --      Height --      Head Cir --      [facility] Flow --      Pain Score --      Pain Loc --      Pain Edu? --      Excl. in [place]? --      Constitutional: Alert and oriented, covered in his own feces Eyes: Conjunctivae are normal.  ENT   Head: Normocephalic and atraumatic.   Mouth/Throat: Mucous membranes are moist. Cardiovascular: Tachycardia, regular rhythm. Normal and symmetric distal pulses are present in all extremities. No murmurs, rubs, or gallops. Respiratory: Normal respiratory effort without tachypnea nor retractions. Breath sounds are clear and equal bilaterally.  Gastrointestinal: Soft and non-tender in all quadrants. No distention. There is no CVA tenderness. Genitourinary: deferred Musculoskeletal: Nontender with normal range of motion in all extremities. No lower extremity tenderness nor edema. Neurologic:  Normal speech and language. No gross focal neurologic deficits are appreciated. Skin:  Skin is warm, dry and intact. No rash noted. Psychiatric: Mood and affect are normal. Patient exhibits appropriate insight and judgment.  ____________________________________________     LABS (pertinent positives/negatives)  Labs Reviewed  CBC - Abnormal; Notable for the following:    WBC 16.1 (*)    RBC 3.45 (*)  Hemoglobin 10.7 (*)    HCT 32.3 (*)    RDW 14.8 (*)    Platelets 122 (*)    All other components within normal limits  COMPREHENSIVE METABOLIC PANEL - Abnormal; Notable for the following:    Potassium 3.0 (*)    Chloride 94 (*)    Glucose, Bld 148 (*)    Calcium 7.9 (*)    Albumin 3.1 (*)    AST 60 (*)    ALT 15 (*)    Alkaline Phosphatase 189 (*)    Total Bilirubin 2.8 (*)    Anion gap 18 (*)    All other components within normal limits  LACTIC ACID, PLASMA - Abnormal; Notable for the following:    Lactic Acid, Venous 2.8 (*)    All other components within normal limits  LIPASE, BLOOD - Abnormal; Notable for the following:    Lipase 13 (*)    All other components within normal limits  MAGNESIUM - Abnormal; Notable for the following:    Magnesium 0.8 (*)    All other components within normal limits  CULTURE, BLOOD (ROUTINE X 2)  CULTURE, BLOOD (ROUTINE X 2)  ETHANOL  TROPONIN I  LACTIC ACID, PLASMA    ____________________________________________   EKG  ED ECG REPORT I, Lavonia Drafts, the attending physician, personally viewed and interpreted this ECG.   Date: 07/01/2015  EKG Time: On arrival  Rate: 140  Rhythm: Sinus tachycardia, nonspecific ST and T waves changes  Axis: normal  Intervals:short PR  ST&T Change: non specific    ____________________________________________    RADIOLOGY I have personally reviewed any xrays that were ordered on this patient: Right hip x-ray no fracture seen  ____________________________________________   PROCEDURES  Procedure(s) performed: none  Critical Care performed: none  ____________________________________________   INITIAL IMPRESSION / ASSESSMENT AND PLAN / ED COURSE  Pertinent labs & imaging results that were available during my care of the patient were reviewed by me  and considered in my medical decision making (see chart for details).  Patient with chronic diarrhea which is reportedly not C. difficile according to hospital records, also with nausea and a recent fall which is left him with right hip pain. X-ray shows no fracture. Lab work significant for elevated white blood cell count, elevated lactate, elevated heart rate. Given all these factors we will admit the patient  ____________________________________________   FINAL CLINICAL IMPRESSION(S) / ED DIAGNOSES  Final diagnoses:  Hip injury, right, initial encounter  Non-intractable vomiting with nausea, vomiting of unspecified type     Lavonia Drafts, MD 07/01/15 2300

## 2015-07-02 ENCOUNTER — Encounter: Payer: Self-pay | Admitting: *Deleted

## 2015-07-02 DIAGNOSIS — E039 Hypothyroidism, unspecified: Secondary | ICD-10-CM | POA: Diagnosis present

## 2015-07-02 DIAGNOSIS — L309 Dermatitis, unspecified: Secondary | ICD-10-CM | POA: Diagnosis not present

## 2015-07-02 DIAGNOSIS — I8500 Esophageal varices without bleeding: Secondary | ICD-10-CM | POA: Diagnosis present

## 2015-07-02 DIAGNOSIS — I10 Essential (primary) hypertension: Secondary | ICD-10-CM | POA: Diagnosis present

## 2015-07-02 DIAGNOSIS — Z6834 Body mass index (BMI) 34.0-34.9, adult: Secondary | ICD-10-CM | POA: Diagnosis not present

## 2015-07-02 DIAGNOSIS — R627 Adult failure to thrive: Secondary | ICD-10-CM | POA: Diagnosis present

## 2015-07-02 DIAGNOSIS — K259 Gastric ulcer, unspecified as acute or chronic, without hemorrhage or perforation: Secondary | ICD-10-CM | POA: Diagnosis present

## 2015-07-02 DIAGNOSIS — N170 Acute kidney failure with tubular necrosis: Secondary | ICD-10-CM | POA: Diagnosis not present

## 2015-07-02 DIAGNOSIS — Y92009 Unspecified place in unspecified non-institutional (private) residence as the place of occurrence of the external cause: Secondary | ICD-10-CM | POA: Diagnosis not present

## 2015-07-02 DIAGNOSIS — Z87891 Personal history of nicotine dependence: Secondary | ICD-10-CM | POA: Diagnosis not present

## 2015-07-02 DIAGNOSIS — D649 Anemia, unspecified: Secondary | ICD-10-CM | POA: Diagnosis present

## 2015-07-02 DIAGNOSIS — E872 Acidosis: Secondary | ICD-10-CM | POA: Diagnosis present

## 2015-07-02 DIAGNOSIS — K5090 Crohn's disease, unspecified, without complications: Secondary | ICD-10-CM | POA: Diagnosis present

## 2015-07-02 DIAGNOSIS — D696 Thrombocytopenia, unspecified: Secondary | ICD-10-CM | POA: Diagnosis present

## 2015-07-02 DIAGNOSIS — E860 Dehydration: Secondary | ICD-10-CM | POA: Diagnosis present

## 2015-07-02 DIAGNOSIS — E875 Hyperkalemia: Secondary | ICD-10-CM | POA: Diagnosis present

## 2015-07-02 DIAGNOSIS — K589 Irritable bowel syndrome without diarrhea: Secondary | ICD-10-CM | POA: Diagnosis present

## 2015-07-02 DIAGNOSIS — M879 Osteonecrosis, unspecified: Secondary | ICD-10-CM | POA: Diagnosis present

## 2015-07-02 DIAGNOSIS — S79911A Unspecified injury of right hip, initial encounter: Secondary | ICD-10-CM | POA: Diagnosis present

## 2015-07-02 DIAGNOSIS — K7030 Alcoholic cirrhosis of liver without ascites: Secondary | ICD-10-CM | POA: Diagnosis present

## 2015-07-02 DIAGNOSIS — Z79899 Other long term (current) drug therapy: Secondary | ICD-10-CM | POA: Diagnosis not present

## 2015-07-02 DIAGNOSIS — E876 Hypokalemia: Secondary | ICD-10-CM | POA: Diagnosis present

## 2015-07-02 DIAGNOSIS — R32 Unspecified urinary incontinence: Secondary | ICD-10-CM | POA: Diagnosis present

## 2015-07-02 DIAGNOSIS — W19XXXA Unspecified fall, initial encounter: Secondary | ICD-10-CM | POA: Diagnosis present

## 2015-07-02 DIAGNOSIS — F10239 Alcohol dependence with withdrawal, unspecified: Secondary | ICD-10-CM | POA: Diagnosis present

## 2015-07-02 DIAGNOSIS — Z96642 Presence of left artificial hip joint: Secondary | ICD-10-CM | POA: Diagnosis present

## 2015-07-02 DIAGNOSIS — D125 Benign neoplasm of sigmoid colon: Secondary | ICD-10-CM | POA: Diagnosis present

## 2015-07-02 LAB — MAGNESIUM: Magnesium: 2.2 mg/dL (ref 1.7–2.4)

## 2015-07-02 LAB — TSH: TSH: 6.863 u[IU]/mL — ABNORMAL HIGH (ref 0.350–4.500)

## 2015-07-02 LAB — PANCREATIC ELASTASE, FECAL: PANCREATIC ELASTASE-1, STL: 84 ug Elast./g — AB (ref >200)

## 2015-07-02 LAB — HEMOGLOBIN A1C: Hgb A1c MFr Bld: 4.6 % (ref 4.0–6.0)

## 2015-07-02 MED ORDER — VANCOMYCIN HCL 10 G IV SOLR
1250.0000 mg | Freq: Once | INTRAVENOUS | Status: AC
  Administered 2015-07-02: 1250 mg via INTRAVENOUS
  Filled 2015-07-02: qty 1250

## 2015-07-02 MED ORDER — PIPERACILLIN-TAZOBACTAM 3.375 G IVPB
3.3750 g | Freq: Three times a day (TID) | INTRAVENOUS | Status: DC
  Administered 2015-07-02 – 2015-07-04 (×7): 3.375 g via INTRAVENOUS
  Filled 2015-07-02 (×12): qty 50

## 2015-07-02 MED ORDER — METRONIDAZOLE IN NACL 5-0.79 MG/ML-% IV SOLN
500.0000 mg | Freq: Three times a day (TID) | INTRAVENOUS | Status: DC
  Administered 2015-07-02 – 2015-07-05 (×9): 500 mg via INTRAVENOUS
  Filled 2015-07-02 (×12): qty 100

## 2015-07-02 MED ORDER — POTASSIUM CHLORIDE CRYS ER 20 MEQ PO TBCR
40.0000 meq | EXTENDED_RELEASE_TABLET | Freq: Two times a day (BID) | ORAL | Status: DC
  Administered 2015-07-02 – 2015-07-04 (×6): 40 meq via ORAL
  Filled 2015-07-02 (×6): qty 2

## 2015-07-02 MED ORDER — LOPERAMIDE HCL 2 MG PO CAPS
2.0000 mg | ORAL_CAPSULE | Freq: Three times a day (TID) | ORAL | Status: DC
  Administered 2015-07-02 – 2015-07-09 (×20): 2 mg via ORAL
  Filled 2015-07-02 (×20): qty 1

## 2015-07-02 MED ORDER — MORPHINE SULFATE 2 MG/ML IJ SOLN
2.0000 mg | INTRAMUSCULAR | Status: DC | PRN
Start: 2015-07-02 — End: 2015-07-05
  Administered 2015-07-02 – 2015-07-05 (×11): 2 mg via INTRAVENOUS
  Filled 2015-07-02 (×11): qty 1

## 2015-07-02 MED ORDER — POTASSIUM CHLORIDE 20 MEQ PO PACK
40.0000 meq | PACK | Freq: Once | ORAL | Status: AC
  Administered 2015-07-02: 40 meq via ORAL
  Filled 2015-07-02: qty 2

## 2015-07-02 MED ORDER — VITAMIN B-1 100 MG PO TABS
100.0000 mg | ORAL_TABLET | Freq: Every day | ORAL | Status: DC
  Administered 2015-07-02 – 2015-07-09 (×7): 100 mg via ORAL
  Filled 2015-07-02 (×7): qty 1

## 2015-07-02 MED ORDER — FOLIC ACID 1 MG PO TABS
1.0000 mg | ORAL_TABLET | Freq: Every day | ORAL | Status: DC
  Administered 2015-07-02 – 2015-07-09 (×7): 1 mg via ORAL
  Filled 2015-07-02 (×7): qty 1

## 2015-07-02 MED ORDER — PREGABALIN 75 MG PO CAPS
150.0000 mg | ORAL_CAPSULE | Freq: Two times a day (BID) | ORAL | Status: DC | PRN
  Administered 2015-07-02 (×2): 150 mg via ORAL
  Filled 2015-07-02 (×2): qty 2

## 2015-07-02 MED ORDER — FUROSEMIDE 40 MG PO TABS
40.0000 mg | ORAL_TABLET | Freq: Every day | ORAL | Status: DC
  Administered 2015-07-02 – 2015-07-03 (×2): 40 mg via ORAL
  Filled 2015-07-02 (×3): qty 1

## 2015-07-02 MED ORDER — ONDANSETRON HCL 4 MG PO TABS: 4.0000 mg | ORAL_TABLET | Freq: Four times a day (QID) | ORAL | Status: DC | PRN

## 2015-07-02 MED ORDER — SACCHAROMYCES BOULARDII 250 MG PO CAPS
250.0000 mg | ORAL_CAPSULE | Freq: Two times a day (BID) | ORAL | Status: DC
  Administered 2015-07-02 – 2015-07-09 (×14): 250 mg via ORAL
  Filled 2015-07-02 (×14): qty 1

## 2015-07-02 MED ORDER — RISAQUAD PO CAPS: 2.0000 | ORAL_CAPSULE | Freq: Every day | ORAL | Status: DC

## 2015-07-02 MED ORDER — VANCOMYCIN HCL 10 G IV SOLR
1250.0000 mg | Freq: Three times a day (TID) | INTRAVENOUS | Status: DC
  Administered 2015-07-02 – 2015-07-03 (×3): 1250 mg via INTRAVENOUS
  Filled 2015-07-02 (×7): qty 1250

## 2015-07-02 MED ORDER — SODIUM CHLORIDE 0.9 % IJ SOLN
3.0000 mL | Freq: Two times a day (BID) | INTRAMUSCULAR | Status: DC
  Administered 2015-07-03 – 2015-07-08 (×7): 3 mL via INTRAVENOUS

## 2015-07-02 MED ORDER — NADOLOL 40 MG PO TABS
40.0000 mg | ORAL_TABLET | Freq: Every day | ORAL | Status: DC
  Administered 2015-07-02 – 2015-07-04 (×3): 40 mg via ORAL
  Filled 2015-07-02 (×5): qty 1

## 2015-07-02 MED ORDER — MAGNESIUM OXIDE 400 (241.3 MG) MG PO TABS
200.0000 mg | ORAL_TABLET | Freq: Two times a day (BID) | ORAL | Status: DC
  Administered 2015-07-03 – 2015-07-09 (×11): 200 mg via ORAL
  Filled 2015-07-02 (×3): qty 1
  Filled 2015-07-02: qty 2
  Filled 2015-07-02 (×8): qty 1

## 2015-07-02 MED ORDER — HEPARIN SODIUM (PORCINE) 5000 UNIT/ML IJ SOLN
5000.0000 [IU] | Freq: Three times a day (TID) | INTRAMUSCULAR | Status: DC
  Administered 2015-07-02 – 2015-07-03 (×5): 5000 [IU] via SUBCUTANEOUS
  Filled 2015-07-02 (×5): qty 1

## 2015-07-02 MED ORDER — QUETIAPINE FUMARATE 25 MG PO TABS
50.0000 mg | ORAL_TABLET | Freq: Every evening | ORAL | Status: DC | PRN
  Administered 2015-07-02 – 2015-07-08 (×8): 50 mg via ORAL
  Filled 2015-07-02 (×8): qty 2

## 2015-07-02 MED ORDER — ZINC OXIDE 20 % EX OINT
TOPICAL_OINTMENT | Freq: Two times a day (BID) | CUTANEOUS | Status: DC | PRN
  Filled 2015-07-02 (×2): qty 28.35

## 2015-07-02 MED ORDER — ONDANSETRON HCL 4 MG/2ML IJ SOLN
4.0000 mg | Freq: Four times a day (QID) | INTRAMUSCULAR | Status: DC | PRN
  Administered 2015-07-02: 4 mg via INTRAVENOUS
  Filled 2015-07-02: qty 2

## 2015-07-02 MED ORDER — ZINC OXIDE 40 % EX OINT
TOPICAL_OINTMENT | Freq: Two times a day (BID) | CUTANEOUS | Status: DC | PRN
  Filled 2015-07-02: qty 114

## 2015-07-02 MED ORDER — MAGNESIUM SULFATE 4 GM/100ML IV SOLN
4.0000 g | Freq: Once | INTRAVENOUS | Status: AC
  Administered 2015-07-02: 4 g via INTRAVENOUS
  Filled 2015-07-02: qty 100

## 2015-07-02 MED ORDER — PIPERACILLIN-TAZOBACTAM 3.375 G IVPB 30 MIN
3.3750 g | Freq: Once | INTRAVENOUS | Status: AC
  Administered 2015-07-02: 3.375 g via INTRAVENOUS
  Filled 2015-07-02: qty 50

## 2015-07-02 MED ORDER — POTASSIUM CHLORIDE IN NACL 40-0.9 MEQ/L-% IV SOLN
INTRAVENOUS | Status: DC
  Administered 2015-07-02 – 2015-07-06 (×6): 125 mL/h via INTRAVENOUS
  Filled 2015-07-02 (×14): qty 1000

## 2015-07-02 MED ORDER — PANTOPRAZOLE SODIUM 40 MG PO TBEC
40.0000 mg | DELAYED_RELEASE_TABLET | Freq: Every day | ORAL | Status: DC
  Administered 2015-07-02 – 2015-07-09 (×7): 40 mg via ORAL
  Filled 2015-07-02 (×7): qty 1

## 2015-07-02 MED ORDER — MAGNESIUM 30 MG PO TABS: 30.0000 mg | ORAL_TABLET | Freq: Two times a day (BID) | ORAL | Status: DC

## 2015-07-02 NOTE — Clinical Social Work Note (Signed)
Clinical Social Work Assessment  Patient Details  Name: Troy [name] MRN: [id number] Date of Birth: [birth year]  Date of referral:  07/02/15               Reason for consult:  Substance Use/ETOH Abuse                Permission sought to share information with:  Other ([place] from [employer]) Permission granted to share information::  Yes, Verbal Permission Granted  Name::      Production manager::   [employer]  Relationship::   Employee of [employer]:   [phone]  Housing/Transportation Living arrangements for the past 2 months:  [place] of Information:  Patient Patient Interpreter Needed:  None Criminal Activity/Legal Involvement Pertinent to Current Situation/Hospitalization:  No - Comment as needed Significant Relationships:  Spouse, Other(Comment) (grandchildren ) Lives with:  Self Do you feel safe going back to the place where you live?  Yes Need for family participation in patient care:  No (Coment)  Care giving concerns:  Patient lives alone in [place] alone.    Social Worker assessment / plan:  Holiday representative ([place]) received verbal substance abuse consult. Patient is a readmit and has worked with several Sales executive at [employer]. CSW staffed case with other CSWs that have worked with patient. Patient has been found at home and in the community multiple times covered in feces. Patient has a long history of severe alcohol use. Department of Social Services was involved on last admission. Per patient DSS has not contacted him. CSW will contact DSS and make them aware of patient's admission. Patient reported that he lives in [place] alone. Per patient his wife moved out temporarily to his uncle's house. Patient reported that his uncle is a resident at [facility] and his house is empty. Patient reported that he has no children but his wife has 2 grandchildren that live with her. Patient reported that he does not have full time employment and receives SSI/  disability. 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 Patient reported that he has 2 reliable vehicles. Patient reported that he does drink alcohol about 3 to 4 times a week. Patient reported that he drinks a fifth of liquor at a time. Patient reported that he has been drinking heavy portions of alcohol for 33 years. Patient reported that he has been to [facility] inpatient rehab in [place] last year. Patient reported that [facility] did help "they did their job now I have to do mine." CSW discussed substance abuse resources in [place]. Patient reported that he is not interested in inpatient rehab however he would consider outpatient resources. Patient reported that he is familiar with Lanae Boast from [facility] and would like to speak with him. Per patient he talked with Lanae Boast a month ago however he lost his number. Patient reported that he did not follow up with Lanae Boast or any of the substance abuse resources provided to him last admission from [facility] asked how likely it was for him to follow up with outpatient substance abuse resources this admission and patient replied "unlikely". Patient did request to speak to Lanae Boast again this admission. CSW left message with Lanae Boast.   Employment status:  Disabled (Comment on whether or not currently receiving Disability) (Per patient he receives SSI) Insurance information:    PT Recommendations:  Not assessed at this time Information / Referral to community resources:  Outpatient Substance Abuse Treatment Options  Patient/Family's Response to care: Patient did not appear to be motivated to follow up with outpatient substance abuse resources.   Patient/Family's Understanding of and Emotional Response to Diagnosis, Current Treatment, and Prognosis:  Patient was sitting in the bed during assessment. Patient seemed restless and distracted.   Emotional Assessment Appearance:  Appears stated age Attitude/Demeanor/Rapport:     Affect (typically observed):  In denial, Restless Orientation:  Oriented to Self, Oriented to Place, Oriented to  Time, Oriented to Situation Alcohol / Substance use:  Alcohol Use Psych involvement (Current and /or in the community):  No (Comment)  Discharge Needs  Concerns to be addressed:  Substance Abuse Concerns Readmission within the last 30 days:  Yes Current discharge risk:  Substance Abuse Barriers to Discharge:  Continued Medical Work up   Loralyn Freshwater, LCSW 07/02/2015, 3:41 PM

## 2015-07-02 NOTE — Progress Notes (Signed)
Clinical Social Worker ([place]) contacted Evlyn Clines [phone] social worker at the department of social services [place]. Per Evlyn Clines she was working with patient after she received an Adult Scientist, forensic report. Per Evlyn Clines patient has capacity and knows what he is doing and the implications of his actions. Per Evlyn Clines APS is signing off his case because he has capacity and refuses services.   Lanae Boast from [facility] reported that he would follow up with patient and give him a call. CSW will continue to follow and assist as needed.   Blima Rich, [place] [phone]

## 2015-07-02 NOTE — H&P (Addendum)
Troy [name] is an 44 y.o. male.   Chief Complaint: Weakness HPI: The patient presents emergency department via EMS after being found in his home covered in feces. The patient states that he slipped while trying to climb out of bed and was too weak to get back onto the bed or to make it to the bathroom. He states that he crawled to the other side of the house to make the 911 call. Past medical history significant for chronic diarrhea but the patient states that he has become dramatically weaker over the last week. He also admits to alcohol abuse but has not had a drink in 6 days. In the emergency department the patient was found to have a leukocytosis as well as significant hypokalemia. He is also very tachycardic. Due to his constellation of symptoms as well as electrolyte abnormalities emergency department staff called for admission  Past Medical History  Diagnosis Date  . Liver damage   . Hypertension   . Neuropathy   . Seizures     complicated ETOH withdrawal  . Pancytopenia, acquired   . Ascites due to alcoholic cirrhosis   . DTs (delirium tremens)   . Hypothyroidism   . Hepatitis C     Past Surgical History  Procedure Laterality Date  . Joint replacement      Left hip replacement    Family History  Problem Relation Age of Onset  . Crohn's disease Father    Social History:  reports that he has quit smoking. He does not have any smokeless tobacco history on file. He reports that he drinks alcohol. His drug history is not on file.  Allergies: No Known Allergies  Prior to Admission medications   Medication Sig Start Date End Date Taking? Authorizing Provider  fidaxomicin (DIFICID) 200 MG TABS tablet Take 1 tablet (200 mg total) by mouth 2 (two) times daily. 06/21/15   Demetrios Loll, MD  folic acid (FOLVITE) 1 MG tablet Take 1 tablet (1 mg total) by mouth daily. 05/17/15   Epifanio Lesches, MD  furosemide (LASIX) 40 MG tablet Take 1 tablet (40 mg total) by mouth daily. 05/17/15    Epifanio Lesches, MD  loperamide (IMODIUM) 2 MG capsule Take 1 capsule (2 mg total) by mouth 3 (three) times daily. 06/21/15   Demetrios Loll, MD  magnesium 30 MG tablet Take 1 tablet (30 mg total) by mouth 2 (two) times daily. 06/21/15   Demetrios Loll, MD  nadolol (CORGARD) 40 MG tablet Take 1 tablet (40 mg total) by mouth daily. 05/17/15   Epifanio Lesches, MD  pantoprazole (PROTONIX) 40 MG tablet Take 1 tablet (40 mg total) by mouth daily. 06/21/15   Demetrios Loll, MD  potassium chloride SA (K-DUR,KLOR-CON) 20 MEQ tablet Take 2 tablets (40 mEq total) by mouth 2 (two) times daily. 05/17/15   Epifanio Lesches, MD  pregabalin (LYRICA) 150 MG capsule Take 150 mg by mouth 2 (two) times daily as needed (for nerve pain).    Historical Provider, MD  QUEtiapine (SEROQUEL) 50 MG tablet Take 50 mg by mouth at bedtime as needed (for sleep).    Historical Provider, MD  saccharomyces boulardii (FLORASTOR) 250 MG capsule Take 1 capsule (250 mg total) by mouth 2 (two) times daily. 06/21/15   Demetrios Loll, MD  thiamine 100 MG tablet Take 1 tablet (100 mg total) by mouth daily. 05/17/15   Epifanio Lesches, MD  zinc oxide 20 % ointment Apply topically 2 (two) times daily as needed for irritation. Apply to  button 06/21/15   Demetrios Loll, MD     Results for orders placed or performed during the hospital encounter of 07/01/15 (from the past 48 hour(s))  CBC     Status: Abnormal   Collection Time: 07/01/15  9:03 PM  Result Value Ref Range   WBC 16.1 (H) 3.8 - 10.6 K/uL   RBC 3.45 (L) 4.40 - 5.90 MIL/uL   Hemoglobin 10.7 (L) 13.0 - 18.0 g/dL   HCT 32.3 (L) 40.0 - 52.0 %   MCV 93.5 80.0 - 100.0 fL   MCH 31.1 26.0 - 34.0 pg   MCHC 33.3 32.0 - 36.0 g/dL   RDW 14.8 (H) 11.5 - 14.5 %   Platelets 122 (L) 150 - 440 K/uL  Comprehensive metabolic panel     Status: Abnormal   Collection Time: 07/01/15  9:03 PM  Result Value Ref Range   Sodium 137 135 - 145 mmol/L   Potassium 3.0 (L) 3.5 - 5.1 mmol/L   Chloride 94 (L) 101 - 111 mmol/L    CO2 25 22 - 32 mmol/L   Glucose, Bld 148 (H) 65 - 99 mg/dL   BUN 16 6 - 20 mg/dL   Creatinine, Ser 0.83 0.61 - 1.24 mg/dL   Calcium 7.9 (L) 8.9 - 10.3 mg/dL   Total Protein 6.6 6.5 - 8.1 g/dL   Albumin 3.1 (L) 3.5 - 5.0 g/dL   AST 60 (H) 15 - 41 U/L   ALT 15 (L) 17 - 63 U/L   Alkaline Phosphatase 189 (H) 38 - 126 U/L   Total Bilirubin 2.8 (H) 0.3 - 1.2 mg/dL   GFR calc non Af Amer >60 >60 mL/min   GFR calc Af Amer >60 >60 mL/min    Comment: (NOTE) The eGFR has been calculated using the CKD EPI equation. This calculation has not been validated in all clinical situations. eGFR's persistently <60 mL/min signify possible Chronic Kidney Disease.    Anion gap 18 (H) 5 - 15  Lactic acid, plasma     Status: Abnormal   Collection Time: 07/01/15  9:03 PM  Result Value Ref Range   Lactic Acid, Venous 2.8 (HH) 0.5 - 2.0 mmol/L    Comment: RESULT REPEATED AND VERIFIED CRITICAL RESULT CALLED TO, READ BACK BY AND VERIFIED WITH: [place] VAN DYKE AT 2214 07/01/15.PMH   Lipase, blood     Status: Abnormal   Collection Time: 07/01/15  9:03 PM  Result Value Ref Range   Lipase 13 (L) 22 - 51 U/L  Ethanol     Status: None   Collection Time: 07/01/15  9:03 PM  Result Value Ref Range   Alcohol, Ethyl (B) <5 <5 mg/dL    Comment:        LOWEST DETECTABLE LIMIT FOR SERUM ALCOHOL IS 5 mg/dL FOR MEDICAL PURPOSES ONLY   Troponin I     Status: None   Collection Time: 07/01/15  9:03 PM  Result Value Ref Range   Troponin I 0.03 <0.031 ng/mL    Comment:        NO INDICATION OF MYOCARDIAL INJURY.   Magnesium     Status: Abnormal   Collection Time: 07/01/15  9:03 PM  Result Value Ref Range   Magnesium 0.8 (LL) 1.7 - 2.4 mg/dL    Comment: RESULTS VERIFIED BY REPEAT TESTING CRITICAL RESULT CALLED TO, READ BACK BY AND VERIFIED WITH [facility] MARTIN AT 2238 07/01/15.PMH    Dg Hip Unilat With Pelvis 2-3 Views Right  07/01/2015   CLINICAL  DATA:  Right hip pain following recent fall, initial encounter  EXAM:  DG HIP (WITH OR WITHOUT PELVIS) 2-3V RIGHT  COMPARISON:  None.  FINDINGS: There are changes consistent with prior left hip replacement. Significant remodeling of the right femoral head is noted consistent with avascular necrosis. Subchondral sclerosis in the acetabulum is seen. No acute fracture or dislocation is noted. The pelvic ring is intact.  IMPRESSION: Changes consistent with avascular necrosis. No acute abnormality is noted.   Electronically Signed   By: Inez Catalina M.D.   On: 07/01/2015 22:00    Review of Systems  Constitutional: Negative for fever and chills.  HENT: Negative for sore throat and tinnitus.   Eyes: Negative for blurred vision and redness.  Respiratory: Negative for cough and shortness of breath.   Cardiovascular: Negative for chest pain, palpitations, orthopnea and PND.  Gastrointestinal: Negative for nausea, vomiting, abdominal pain and diarrhea.  Genitourinary: Negative for dysuria, urgency and frequency.  Musculoskeletal: Positive for joint pain. Negative for myalgias.  Skin: Negative for rash.       No lesions  Neurological: Positive for weakness. Negative for speech change and focal weakness.  Endo/Heme/Allergies: Does not bruise/bleed easily.       No temperature intolerance  Psychiatric/Behavioral: Negative for depression and suicidal ideas.    Blood pressure 153/77, pulse 112, temperature 98.1 F (36.7 C), temperature source Oral, resp. rate 20, height 5' 8"  (1.727 m), weight 104.327 kg (230 lb), SpO2 93 %. Physical Exam  Nursing note and vitals reviewed. Constitutional: He is oriented to person, place, and time. He appears well-developed and well-nourished. No distress.  HENT:  Head: Normocephalic and atraumatic.  Mouth/Throat: Oropharynx is clear and moist.  Eyes: Conjunctivae and EOM are normal. Pupils are equal, round, and reactive to light. No scleral icterus.  Neck: Normal range of motion. Neck supple. No JVD present. No tracheal deviation present.  No thyromegaly present.  Cardiovascular: Regular rhythm and normal heart sounds.  Exam reveals no gallop and no friction rub.   No murmur heard. Respiratory: Effort normal and breath sounds normal. No respiratory distress.  GI: Soft. Bowel sounds are normal. He exhibits no distension.  Genitourinary:  Deferred  Musculoskeletal: Normal range of motion. He exhibits no edema.  Lymphadenopathy:    He has no cervical adenopathy.  Neurological: He is alert and oriented to person, place, and time. No cranial nerve deficit.  Skin: Skin is warm and dry. No rash noted. No erythema.  Psychiatric: He has a normal mood and affect. His behavior is normal. Judgment and thought content normal.     Assessment/Plan This is a 44 year old Caucasian male admitted for sepsis, weakness and diarrhea. 1. Sepsis: The patient meets criteria via tachycardia and leukocytosis. He is hemodynamically stable. I have started him on vancomycin and Zosyn. Blood cultures have been obtained and we will follow for growth and sensitivities. 2. Weakness: Likely secondary to sepsis, dehydration and hypokalemia. We will replete the patient's potassium and progressively hydrate him with intravenous fluid. Physical therapy consultation ordered. 3. Diarrhea: The patient has had chronic loose stool for more than a month. At one point he was being treated for C. difficile colitis but on his most recent hospitalization C. difficile assay was negative. The patient has been taking Dificid at home but I have discontinued this medication for now as he has no indication of C. difficile. Enterotoxin assay ordered in the emergency department. He may continue his Saccharomyces oral antibiotic and I have placed him on probiotics  as well. 4. Alcohol withdrawal: I have placed the patient on a CIWA scale. Likely contributing to diarrhea as well 5. Hip pain: Avascular necrosis. We will manage pain as best we can. Patient is status post left hip replacement  and may need the right hip evaluated as well. 6. DVT prophylaxis: Heparin 7. GI prophylaxis: None The patient is a full code. Time spent on admission orders and patient care approximately 35 minutes  Harrie Foreman 07/02/2015, 12:41 AM

## 2015-07-02 NOTE — Progress Notes (Signed)
[facility] [place] NAME: Troy [name]    MR#:  [id number]  DATE OF BIRTH:  Nov 26, 1971  SUBJECTIVE:  CHIEF COMPLAINT:  Pt is still having diarrhea with no blood, nauseous but denies vomiting.   REVIEW OF SYSTEMS:  CONSTITUTIONAL: No fever, reports fatigue and weakness.  EYES: No blurred or double vision.  EARS, NOSE, AND THROAT: No tinnitus or ear pain.  RESPIRATORY: No cough, shortness of breath, wheezing or hemoptysis.  CARDIOVASCULAR: No chest pain, orthopnea, edema.  GASTROINTESTINAL: has nausea and, diarrhea. Denies  abdominal pain or vomiting GENITOURINARY: No dysuria, hematuria.  ENDOCRINE: No polyuria, nocturia,  HEMATOLOGY: No anemia, easy bruising or bleeding SKIN: No rash or lesion. MUSCULOSKELETAL: rt hip  joint pain .   NEUROLOGIC: No tingling, numbness, weakness.  PSYCHIATRY: No anxiety or depression.   DRUG ALLERGIES:  No Known Allergies  VITALS:  Blood pressure 142/80, pulse 100, temperature 99.3 F (37.4 C), temperature source Axillary, resp. rate 18, height 5\' 8"  (1.727 m), weight 95.255 kg (210 lb), SpO2 97 %.  PHYSICAL EXAMINATION:  GENERAL:  44 y.o.-year-old patient lying in the bed with no acute distress. Thin looking, frial  EYES: Pupils equal, round, reactive to light and accommodation. No scleral icterus. Extraocular muscles intact.  HEENT: Head atraumatic, normocephalic. Oropharynx and nasopharynx clear.  NECK:  Supple, no jugular venous distention. No thyroid enlargement, no tenderness.  LUNGS: Normal breath sounds bilaterally, no wheezing, rales,rhonchi or crepitation. No use of accessory muscles of respiration.  CARDIOVASCULAR: S1, S2 normal. No murmurs, rubs, or gallops.  ABDOMEN: Soft, nontender, nondistended. Bowel sounds present. No organomegaly or mass.  EXTREMITIES: No pedal edema, cyanosis, or clubbing.  NEUROLOGIC: Cranial nerves II through XII are intact. Sensation intact. Gait not checked.   PSYCHIATRIC: The patient is alert and oriented x 3.  SKIN: No obvious rash, lesion, or ulcer.    LABORATORY PANEL:   CBC  Recent Labs Lab 07/01/15 2103  WBC 16.1*  HGB 10.7*  HCT 32.3*  PLT 122*   ------------------------------------------------------------------------------------------------------------------  Chemistries   Recent Labs Lab 07/01/15 2103 07/02/15 1354  NA 137  --   K 3.0* 2.7*  CL 94*  --   CO2 25  --   GLUCOSE 148*  --   BUN 16  --   CREATININE 0.83  --   CALCIUM 7.9*  --   MG 0.8* 2.2  AST 60*  --   ALT 15*  --   ALKPHOS 189*  --   BILITOT 2.8*  --    ------------------------------------------------------------------------------------------------------------------  Cardiac Enzymes  Recent Labs Lab 07/01/15 2103  TROPONINI 0.03   ------------------------------------------------------------------------------------------------------------------  RADIOLOGY:  Dg Hip Unilat With Pelvis 2-3 Views Right  07/01/2015   CLINICAL DATA:  Right hip pain following recent fall, initial encounter  EXAM: DG HIP (WITH OR WITHOUT PELVIS) 2-3V RIGHT  COMPARISON:  None.  FINDINGS: There are changes consistent with prior left hip replacement. Significant remodeling of the right femoral head is noted consistent with avascular necrosis. Subchondral sclerosis in the acetabulum is seen. No acute fracture or dislocation is noted. The pelvic ring is intact.  IMPRESSION: Changes consistent with avascular necrosis. No acute abnormality is noted.   Electronically Signed   By: Inez Catalina M.D.   On: 07/01/2015 22:00    EKG:   Orders placed or performed during the hospital encounter of 07/01/15  . ED EKG  . ED EKG  . EKG 12-Lead  . EKG 12-Lead  ASSESSMENT AND PLAN:   Assessment/Plan This is a 44 year old Caucasian male admitted for sepsis, weakness and diarrhea. 1. Sepsis: The patient meets septic criteria with tachycardi leukocytosis and diarrhea. Continue   vancomycin and Zosyn.  Blood cultures have been negative so far  2. SevereWeakness: Likely secondary to sepsis, dehydration and hypokalemia.  We will replete the patient's potassium and electrolytes hydrate him with intravenous fluid.  Physical therapy consultation ordered . 3. Diarrhea: The patient has had chronic loose stool for more than a month.  At one point he was being treated for C. difficile colitis but on his most recent hospitalization C. difficile assay was negative.  The patient has been taking Dificid at home but was discontinued by admitting physician as no indication of C. difficile.  Enterotoxin assay ordered in the emergency department. on probiotics. GI consult  4. 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   5. Hip pain: sec to Avascular necrosis.  We will manage pain with pain meds .  Patient is status post left hip replacement and dr.Menz is recommending OP RHA  6. Weakness/FTT- PT eval  7. DVT prophylaxis: Heparin 8. GI prophylaxis: None     All the records are reviewed and case discussed with Care Management/Social Workerr. Management plans discussed with the patient, family and they are in agreement.  CODE STATUS: full   TOTAL TIME TAKING CARE OF THIS PATIENT: 35 minutes.   POSSIBLE D/C IN 2-3 DAYS, DEPENDING ON CLINICAL CONDITION.   Nicholes Mango M.D on 07/02/2015 at 6:30 PM  Between 7am to 6pm - Pager - [phone] After 6pm go to [email or web address] - password [email or web address] [facility]  [facility]  Office  [phone]  CC: Primary care physician; No PCP Per Patient

## 2015-07-02 NOTE — ED Notes (Signed)
Pt transported on monitor with all belongings . Pt in NAD at time of transport.

## 2015-07-02 NOTE — Progress Notes (Signed)
Lab called to say potassium was low.  MD notified.  Klor ordered for now and to repeat in 4 hours

## 2015-07-02 NOTE — Progress Notes (Signed)
PT Hold Note  Patient Details Name: Troy [name] MRN: [id number] DOB: 1971-04-12   Cancelled Treatment:    Reason Eval/Treat Not Completed: Medical issues which prohibited therapy. Chart reviewed and RN consulted this AM. K+ is even lower this afternoon dropping from 3.0 to 2.7. Klor-con ordered. Pt is currently tachycardic at rest with HR of 125. Will hold patient currently and attempt evaluation at later time/date as appropriate.   Lyndel Safe Meggin Ola PT, DPT   Shukri Nistler 07/02/2015, 2:37 PM

## 2015-07-02 NOTE — Progress Notes (Signed)
ANTIBIOTIC CONSULT NOTE - INITIAL  Pharmacy Consult for Vancomycin/Zosyn Indication: rule out sepsis  No Known Allergies  Patient Measurements: Height: 5\' 8"  (172.7 cm) Weight: 210 lb (95.255 kg) IBW/kg (Calculated) : 68.4 Adjusted Body Weight: 82.6 kg  Vital Signs: Temp: 98.7 F (37.1 C) (07/18 0110) Temp Source: Oral (07/18 0110) BP: 131/84 mmHg (07/18 0110) Pulse Rate: 105 (07/18 0110) Intake/Output from previous day:   Intake/Output from this shift:    Labs:  Recent Labs  07/01/15 2103  WBC 16.1*  HGB 10.7*  PLT 122*  CREATININE 0.83   Estimated Creatinine Clearance: 127.2 mL/min (by C-G formula based on Cr of 0.83). No results for input(s): VANCOTROUGH, VANCOPEAK, VANCORANDOM, GENTTROUGH, GENTPEAK, GENTRANDOM, TOBRATROUGH, TOBRAPEAK, TOBRARND, AMIKACINPEAK, AMIKACINTROU, AMIKACIN in the last 72 hours.   Microbiology: Recent Results (from the past 720 hour(s))  Blood Culture (routine x 2)     Status: None   Collection Time: 06/06/15  4:14 PM  Result Value Ref Range Status   Specimen Description BLOOD  Final   Special Requests NONE  Final   Culture NO GROWTH 5 DAYS  Final   Report Status 06/11/2015 FINAL  Final  Stool culture     Status: None   Collection Time: 06/06/15  4:14 PM  Result Value Ref Range Status   Specimen Description STOOL  Final   Special Requests Normal  Final   Culture   Final    NO SALMONELLA OR SHIGELLA ISOLATED No Pathogenic E. coli detected    Report Status 06/10/2015 FINAL  Final  C difficile quick scan w PCR reflex ([facility] only)     Status: None   Collection Time: 06/06/15  4:20 PM  Result Value Ref Range Status   C Diff antigen POSITIVE  Final   C Diff toxin NEGATIVE  Final   C Diff interpretation   Final    Positive for toxigenic C. difficile, active toxin production not detected. Patient has toxigenic C. difficile organisms present in the bowel, but toxin was not detected. The patient may be a carrier or the level of toxin in  the sample was below the limit  of detection. This information should be used in conjunction with the patient's clinical history when deciding on possible therapy.     Comment: CRITICAL RESULT CALLED TO, READ BACK BY AND VERIFIED WITH: LISA THOMPSON 06/06/15 AT 2033.SDR   Blood Culture (routine x 2)     Status: None   Collection Time: 06/06/15  4:20 PM  Result Value Ref Range Status   Specimen Description BLOOD  Final   Special Requests NONE  Final   Culture  Setup Time   Final    GRAM POSITIVE RODS ANAEROBIC BOTTLE ONLY CRITICAL RESULT CALLED TO, READ BACK BY AND VERIFIED WITH: Kathi Simpers AT 6314 06/07/15 DV    Culture   Final    CLOSTRIDIUM PARAPUTRIFICUM ANAEROBIC BOTTLE ONLY COAGULASE NEGATIVE STAPHYLOCOCCUS AEROBIC BOTTLE ONLY POSSIBLE CONTAMINATION WITH SKIN FLORA     Report Status 06/12/2015 FINAL  Final   Organism ID, Bacteria CLOSTRIDIUM PARAPUTRIFICUM  Final  Clostridium Difficile by PCR (not at [facility])     Status: Abnormal   Collection Time: 06/06/15  4:20 PM  Result Value Ref Range Status   C difficile by pcr POSITIVE (A) NEGATIVE Final    Comment: Positive for toxigenic C. difficile, active toxin production not detected. Patient has toxigenic C. difficile organisms present in the bowel, but toxin was not detected. The patient may be a carrier or  the level of toxin in the sample was below the limit  of detection. This information should be used in conjunction with the patient's clinical history when deciding on possible therapy. CRITICAL RESULT CALLED TO, READ BACK BY AND VERIFIED WITH: LISA THOMPSON 06/06/15 2030. SDW   Giardia, EIA; Ova/Parasite     Status: None   Collection Time: 06/06/15  5:09 PM  Result Value Ref Range Status   Ova + Parasite Exam Final report  Final    Comment: (NOTE) These results were obtained using wet preparation(s) and trichrome stained smear. This test does not include testing for Cryptosporidium parvum, Cyclospora, or  Microsporidia.    Giardia Ag, Stl Negative Negative Final    Comment: (NOTE) Performed At: [facility] [place], [place] [id number] Caprice Red MD [id number] Performed At: [facility] [place], [place] [id number] Lindon Romp MD QQ:[id number]    Source of Sample STOOL  Corrected  MRSA PCR Screening     Status: None   Collection Time: 06/06/15  9:00 PM  Result Value Ref Range Status   MRSA by PCR NEGATIVE NEGATIVE Final    Comment:        The GeneXpert MRSA Assay (FDA approved for NASAL specimens only), is one component of a comprehensive MRSA colonization surveillance program. It is not intended to diagnose MRSA infection nor to guide or monitor treatment for MRSA infections.   Urine culture     Status: None   Collection Time: 06/07/15  3:41 PM  Result Value Ref Range Status   Specimen Description URINE, CLEAN CATCH  Final   Special Requests NONE  Final   Culture NO GROWTH 2 DAYS  Final   Report Status 06/09/2015 FINAL  Final  C difficile quick scan w PCR reflex ([facility] only)     Status: None   Collection Time: 06/12/15  4:21 PM  Result Value Ref Range Status   C Diff antigen NEGATIVE  Final   C Diff toxin NEGATIVE  Final   C Diff interpretation Negative for C. difficile  Final  C difficile quick scan w PCR reflex ([facility] only)     Status: None   Collection Time: 06/18/15 10:30 PM  Result Value Ref Range Status   C Diff antigen NEGATIVE  Final   C Diff toxin NEGATIVE  Final   C Diff interpretation Negative for C. difficile  Final    Medical History: Past Medical History  Diagnosis Date  . Liver damage   . Hypertension   . Neuropathy   . Seizures     complicated ETOH withdrawal  . Pancytopenia, acquired   . Ascites due to alcoholic cirrhosis   . DTs (delirium tremens)   . Hypothyroidism   . Hepatitis C     Medications:  Scheduled:  . folic acid  1 mg Oral Daily  . furosemide  40 mg Oral Daily  .  heparin  5,000 Units Subcutaneous 3 times per day  . loperamide  2 mg Oral TID  . [START ON 07/03/2015] magnesium oxide  200 mg Oral BID  . nadolol  40 mg Oral Daily  . pantoprazole  40 mg Oral Daily  . piperacillin-tazobactam  3.375 g Intravenous Once  . piperacillin-tazobactam (ZOSYN)  IV  3.375 g Intravenous 3 times per day  . potassium chloride SA  40 mEq Oral BID  . saccharomyces boulardii  250 mg Oral BID  . sodium chloride  3 mL Intravenous Q12H  .  thiamine  100 mg Oral Daily  . vancomycin  1,250 mg Intravenous Once  . vancomycin  1,250 mg Intravenous Q8H   Assessment: Patient is a 44 yo male admitted for sepsis.  Pharmacy consult for empiric Vancomycin and Zosyn.   SCr: 0.83, est CrCl~127 mL/min, ke: 0.112, t1/2: 6.2 h, Vd: 57.8 L  Goal of Therapy:  Vancomycin trough level 15-20 mcg/ml  Plan:  Patient received Vancomycin 1250 mg IV once at 0206.  Will order next dose for 6 hours after first dose for stacked dosing.  Will then order maintenance dosing of Vancomycin 1250 mg IV q8h.  Will check trough prior to 4th dose on 7/19 at 0730 (should be at steady state).    Will order Zosyn 3.375 gm IV q8h per EI protocol.  Measure antibiotic drug levels at steady state Follow up culture results  Pharmacy will continue to follow.  Sharnese Heath G 07/02/2015,2:46 AM

## 2015-07-02 NOTE — Consult Note (Signed)
Right hip AVN. Recommend THA Follow up as outpatient to discuss

## 2015-07-02 NOTE — ED Notes (Signed)
Report given to Felicia RN.

## 2015-07-03 ENCOUNTER — Encounter: Payer: Self-pay | Admitting: Radiology

## 2015-07-03 ENCOUNTER — Inpatient Hospital Stay: Payer: 59

## 2015-07-03 LAB — BASIC METABOLIC PANEL
ANION GAP: 10 (ref 5–15)
BUN: 8 mg/dL (ref 6–20)
CO2: 22 mmol/L (ref 22–32)
Calcium: 7.4 mg/dL — ABNORMAL LOW (ref 8.9–10.3)
Chloride: 108 mmol/L (ref 101–111)
Creatinine, Ser: 0.91 mg/dL (ref 0.61–1.24)
GFR calc Af Amer: >60 mL/min (ref >60)
GFR calc non Af Amer: >60 mL/min (ref >60)
Glucose, Bld: 102 mg/dL — ABNORMAL HIGH (ref 65–99)
Potassium: 2.9 mmol/L — ABNORMAL LOW (ref 3.5–5.1)
Sodium: 140 mmol/L (ref 135–145)

## 2015-07-03 LAB — CBC
HCT: 25.6 % — ABNORMAL LOW (ref 40.0–52.0)
Hemoglobin: 8.4 g/dL — ABNORMAL LOW (ref 13.0–18.0)
MCH: 31.6 pg (ref 26.0–34.0)
MCHC: 32.9 g/dL (ref 32.0–36.0)
MCV: 96.2 fL (ref 80.0–100.0)
Platelets: 50 10*3/uL — ABNORMAL LOW (ref 150–440)
RBC: 2.66 MIL/uL — ABNORMAL LOW (ref 4.40–5.90)
RDW: 14.6 % — ABNORMAL HIGH (ref 11.5–14.5)
WBC: 6.7 10*3/uL (ref 3.8–10.6)

## 2015-07-03 LAB — MAGNESIUM
Magnesium: 1.3 mg/dL — ABNORMAL LOW (ref 1.7–2.4)
Magnesium: 1.3 mg/dL — ABNORMAL LOW (ref 1.7–2.4)

## 2015-07-03 LAB — GLUCOSE, CAPILLARY: Glucose-Capillary: 85 mg/dL (ref 65–99)

## 2015-07-03 LAB — VANCOMYCIN, TROUGH: Vancomycin Tr: 41 ug/mL (ref 10.0–20.0)

## 2015-07-03 LAB — C DIFFICILE QUICK SCREEN W PCR REFLEX
C Diff antigen: NEGATIVE
C Diff interpretation: NEGATIVE
C Diff toxin: NEGATIVE

## 2015-07-03 LAB — POTASSIUM
POTASSIUM: 2.7 mmol/L — AB (ref 3.5–5.1)
Potassium: 3.7 mmol/L (ref 3.5–5.1)

## 2015-07-03 MED ORDER — IOHEXOL 240 MG/ML SOLN: 50.0000 mL | INTRAMUSCULAR | Status: AC

## 2015-07-03 MED ORDER — IOHEXOL 300 MG/ML  SOLN
100.0000 mL | Freq: Once | INTRAMUSCULAR | Status: AC | PRN
  Administered 2015-07-03: 100 mL via INTRAVENOUS

## 2015-07-03 MED ORDER — MAGNESIUM SULFATE 2 GM/50ML IV SOLN
2.0000 g | Freq: Once | INTRAVENOUS | Status: AC
  Administered 2015-07-03: 2 g via INTRAVENOUS
  Filled 2015-07-03: qty 50

## 2015-07-03 MED ORDER — MAGNESIUM SULFATE IN D5W 10-5 MG/ML-% IV SOLN
1.0000 g | Freq: Once | INTRAVENOUS | Status: DC
  Filled 2015-07-03: qty 100

## 2015-07-03 MED ORDER — VANCOMYCIN HCL 10 G IV SOLR
1250.0000 mg | INTRAVENOUS | Status: DC
  Filled 2015-07-03 (×2): qty 1250

## 2015-07-03 MED ORDER — POTASSIUM CHLORIDE 20 MEQ PO PACK
40.0000 meq | PACK | Freq: Once | ORAL | Status: AC
  Administered 2015-07-03: 40 meq via ORAL
  Filled 2015-07-03: qty 2

## 2015-07-03 MED ORDER — NYSTATIN 100000 UNIT/GM EX POWD
Freq: Three times a day (TID) | CUTANEOUS | Status: DC
  Administered 2015-07-03 – 2015-07-08 (×10): via TOPICAL
  Filled 2015-07-03 (×4): qty 15

## 2015-07-03 MED ORDER — VANCOMYCIN HCL IN DEXTROSE 1-5 GM/200ML-% IV SOLN
1000.0000 mg | INTRAVENOUS | Status: DC
  Filled 2015-07-03: qty 200

## 2015-07-03 NOTE — Care Management (Cosign Needed)
List of home health agencies provided to this high risk readmission patient along with personal care/sitter services. PT is recommending 24/7 care including SNF but per CSW patient wants to go home. RNCM will continue to follow. Patient may benefit from [facility] and this week would be [facility].

## 2015-07-03 NOTE — Progress Notes (Signed)
I have seen and examined Mr Witman and agree with Michelle's a/p.  He continues to have diarrhea,  c.dif negative.   Recommend CT a/p and colonoscpy / EGD with colon and small bowel biopsies.   Repeat stool culture and o and p.  Check Free T4.   Will plan for colon / EGD on Thursday due to endoscopy unit scheduling availability.

## 2015-07-03 NOTE — Evaluation (Signed)
Physical Therapy Evaluation Patient Details Name: Troy [name] MRN: [id number] DOB: 02-08-1971 Today's Date: 07/03/2015   History of Present Illness  Pt was admitted to hospital after being found in his home by EMS covered in his own feces. Pt has hx of alcohol abuse and recent chronic diarrhea.  Clinical Impression  Pt presents with hx of liver damage, HTN, neuropathy, seizures, panctyopenia, ascites, DTs, hypothyroidism, and hepatitis C. Examination reveals that pt is only able to take two small steps and requires min to mod assist for bed mobility and max assist for transfers. Pt presents with generalized weakness and decreased activity tolerance. He will benefit from skilled PT in order to return home safely in the future. SNF was communicated to the pt as the PT's recommendation, and he stated that he is open to whatever the recommendation is from PT, however that he'd prefer to go home if possible. The danger's of going home without help at this time were discussed with the pt, and he agreed.     Follow Up Recommendations SNF    Equipment Recommendations  None recommended by PT    Recommendations for Other Services       Precautions / Restrictions Precautions Precautions: Fall Precaution Comments: Enteric Restrictions Weight Bearing Restrictions: No      Mobility  Bed Mobility Overal bed mobility: Modified Independent Bed Mobility: Supine to Sit     Supine to sit: Mod assist Sit to supine: Min assist   General bed mobility comments: Requires heavy use of bedrails as well as cues for hand placement  Transfers Overall transfer level: Needs assistance Equipment used: Rolling walker (2 wheeled);None Transfers: Sit to/from Stand Sit to Stand: Max assist         General transfer comment: Pt requires substantial lifting assist from PT as well as antero-posterior tactile cues for BOS correction (Will gain balance within seconds)  Ambulation/Gait Ambulation/Gait  assistance: Min guard Ambulation Distance (Feet): 2 Feet Assistive device: Rolling walker (2 wheeled) Gait Pattern/deviations: Step-to pattern;Decreased step length - right;Decreased step length - left;Steppage Gait velocity: Reduced Gait velocity interpretation: Below normal speed for age/gender General Gait Details: Pt only took two very small steps and then needed to sit in bed. He requires vc to take bigger steps, but was not able to do so.   Stairs            Wheelchair Mobility    Modified Rankin (Stroke Patients Only)       Balance Overall balance assessment: History of Falls (3 in past 6 months)   Sitting balance-Leahy Scale: Poor       Standing balance-Leahy Scale: Poor                               Pertinent Vitals/Pain Pain Assessment: No/denies pain (States pain in buttocks from wiping (7/10))    Home Living Family/patient expects to be discharged to:: Private residence Living Arrangements: Alone   Type of Home: House Home Access: Ramped entrance     Home Layout: One level        Prior Function Level of Independence: Independent with assistive device(s)         Comments: previously working as Oceanographer, on disability     Hand Dominance        Extremity/Trunk Assessment   Upper Extremity Assessment: Generalized weakness           Lower Extremity Assessment: Generalized weakness  Communication   Communication: No difficulties  Cognition Arousal/Alertness: Awake/alert Behavior During Therapy: WFL for tasks assessed/performed Overall Cognitive Status: Within Functional Limits for tasks assessed                      General Comments      Exercises        Assessment/Plan    PT Assessment Patient needs continued PT services  PT Diagnosis Generalized weakness;Difficulty walking;Abnormality of gait   PT Problem List Decreased strength;Decreased range of motion;Decreased activity  tolerance;Decreased balance;Decreased mobility;Decreased coordination;Decreased safety awareness;Decreased knowledge of precautions  PT Treatment Interventions DME instruction;Gait training;Functional mobility training;Therapeutic activities;Therapeutic exercise;Balance training;Neuromuscular re-education;Patient/family education   PT Goals (Current goals can be found in the Care Plan section) Acute Rehab PT Goals Patient Stated Goal: "I would like to return home" PT Goal Formulation: With patient Time For Goal Achievement: 07/17/15 Potential to Achieve Goals: Fair    Frequency Min 2X/week   Barriers to discharge Inaccessible home environment Per pt history, uninhabitable due to fecal matter    Co-evaluation               End of Session Equipment Utilized During Treatment: Gait belt Activity Tolerance: Patient limited by fatigue Patient left: in bed;with call bell/phone within reach;with bed alarm set Nurse Communication: Mobility status         Time: 1432-1510 PT Time Calculation (min) (ACUTE ONLY): 38 min   Charges:         PT G CodesJanyth Contes 07/15/2015, 4:31 PM  [name], SPT. [phone]

## 2015-07-03 NOTE — Progress Notes (Signed)
Per Lanae Boast from [facility] they do not accept [employer] at [employer] does accept [employer] Clinical Education officer, museum (CSW) met with patient and provided information for [employer]. CSW discussed Trinity's walk in clinic hours and provided emotional support and encouragement to patient. CSW will continue to follow and assist as needed.   Blima Rich, [place] [phone]

## 2015-07-03 NOTE — Consult Note (Signed)
GI Inpatient Consult Note  Reason for Consult: n/v/d   Attending Requesting Consult: Gouru  History of Present Illness: Troy [name] is a 44 y.o. male with a history of C diff infection, hep C, cirrhosis, alcohol-related seizures, and metabolic and hepatic encephalopathy admitted for ongoing diarrhea, dehydration, and weakness.  Upon EMS arrival to his home, EMS noted the patient was found covered feces because he felt too weak to make it to the restroom.  He also endorsed previous EtOH use, but denied having a drink in 6 days. In the ED, patient was hypotensive with systolic BP of 60 and tachycardic.  This improved with IV fluids.  Lab abnormalities include elevated WBCs at 34,000 and potassium <2.  He was admitted for further evaluation and management.  Notably, on 06/06/15 patient's stool was positive for C. Diff antigen, negative for toxin.  He was also negative when tested again on 6/28 and 7/4.  A 14-day course of deficid was prescribed on 6/28.  Today, patient continues to have diarrhea, incontinence and mild nausea.  He also reports crampy abdominal pain, mostly R-sided.  He denies bloody diarrhea and vomiting.  He reports the diarrhea has not improved much since his last admission, however he has not been taking cholestyramine or imodium as after last admission.  He also is no longer taking lactulose at home for encephalopathy.  He has no new complaints today.  Denies fever, chills, dysphagia, reflux, abdominal pain, constipation, and melena.  Past Medical History:  Past Medical History  Diagnosis Date  . Liver damage   . Hypertension   . Neuropathy   . Seizures     complicated ETOH withdrawal  . Pancytopenia, acquired   . Ascites due to alcoholic cirrhosis   . DTs (delirium tremens)   . Hypothyroidism   . Hepatitis C     Problem List: Patient Active Problem List   Diagnosis Date Noted  . Hypocalcemia 06/09/2015  . Protein-calorie malnutrition, severe 06/09/2015  .  Hypokalemia 06/07/2015  . Enteritis due to Clostridium difficile 06/07/2015  . Anemia of chronic disease 06/07/2015  . Hyponatremia 06/07/2015  . Sepsis 06/06/2015  . Acute renal failure 06/06/2015  . Alcohol withdrawal seizure 05/13/2015  . Lactic acidosis 05/13/2015  . Leukocytosis 05/13/2015    Past Surgical History: Past Surgical History  Procedure Laterality Date  . Joint replacement      Left hip replacement    Allergies: No Known Allergies  Home Medications: Prescriptions prior to admission  Medication Sig Dispense Refill Last Dose  . pregabalin (LYRICA) 150 MG capsule Take 150 mg by mouth 2 (two) times daily as needed (for nerve pain).   07/01/2015 at Unknown time  . QUEtiapine (SEROQUEL) 50 MG tablet Take 50 mg by mouth at bedtime as needed (for sleep).   07/01/2015 at Unknown time  . fidaxomicin (DIFICID) 200 MG TABS tablet Take 1 tablet (200 mg total) by mouth 2 (two) times daily. 10 tablet 0   . folic acid (FOLVITE) 1 MG tablet Take 1 tablet (1 mg total) by mouth daily. 30 tablet 0 unknown at unknown  . furosemide (LASIX) 40 MG tablet Take 1 tablet (40 mg total) by mouth daily. 30 tablet 0 unknown at unknown  . loperamide (IMODIUM) 2 MG capsule Take 1 capsule (2 mg total) by mouth 3 (three) times daily. 30 capsule 0   . magnesium 30 MG tablet Take 1 tablet (30 mg total) by mouth 2 (two) times daily. 14 tablet 0   .  nadolol (CORGARD) 40 MG tablet Take 1 tablet (40 mg total) by mouth daily. 30 tablet 0 unknown at unknown  . pantoprazole (PROTONIX) 40 MG tablet Take 1 tablet (40 mg total) by mouth daily. 60 tablet 0   . potassium chloride SA (K-DUR,KLOR-CON) 20 MEQ tablet Take 2 tablets (40 mEq total) by mouth 2 (two) times daily. 20 tablet 0 unknown at unknown  . saccharomyces boulardii (FLORASTOR) 250 MG capsule Take 1 capsule (250 mg total) by mouth 2 (two) times daily. 20 capsule 0   . thiamine 100 MG tablet Take 1 tablet (100 mg total) by mouth daily. 30 tablet 0 unknown  at unknown  . zinc oxide 20 % ointment Apply topically 2 (two) times daily as needed for irritation. Apply to button 56.7 g 0    Home medication reconciliation was completed with the patient.   Scheduled Inpatient Medications:   . folic acid  1 mg Oral Daily  . furosemide  40 mg Oral Daily  . heparin  5,000 Units Subcutaneous 3 times per day  . loperamide  2 mg Oral TID  . magnesium oxide  200 mg Oral BID  . metronidazole  500 mg Intravenous Q8H  . nadolol  40 mg Oral Daily  . pantoprazole  40 mg Oral Daily  . piperacillin-tazobactam (ZOSYN)  IV  3.375 g Intravenous 3 times per day  . potassium chloride SA  40 mEq Oral BID  . saccharomyces boulardii  250 mg Oral BID  . sodium chloride  3 mL Intravenous Q12H  . thiamine  100 mg Oral Daily  . vancomycin  1,250 mg Intravenous Q8H    Continuous Inpatient Infusions:   . 0.9 % NaCl with KCl 40 mEq / L 125 mL/hr (07/02/15 0805)    PRN Inpatient Medications:  liver oil-zinc oxide, morphine injection, ondansetron **OR** ondansetron (ZOFRAN) IV, pregabalin, QUEtiapine  Family History: family history includes Crohn's disease in his father.   Social History:   reports that he has quit smoking. He does not have any smokeless tobacco history on file. He reports that he drinks alcohol.   Review of Systems: Constitutional: Weight is stable.  Eyes: No changes in vision. ENT: No oral lesions, sore throat.  GI: see HPI.  Heme/Lymph: No easy bruising.  CV: No chest pain.  GU: No hematuria.  Integumentary: No rashes.  Neuro: No headaches.  Psych: No depression/anxiety.  Endocrine: No heat/cold intolerance.  Allergic/Immunologic: No urticaria.  Resp: No cough, SOB.  Musculoskeletal: No joint swelling.    Physical Examination: BP 96/58 mmHg  Pulse 96  Temp(Src) 98.2 F (36.8 C) (Oral)  Resp 18  Ht 5\' 8"  (1.727 m)  Wt 92.035 kg (202 lb 14.4 oz)  BMI 30.86 kg/m2  SpO2 98% Gen: NAD, alert and oriented x 4, sitting up in bed  comfortably, drowsy HEENT: PEERLA, EOMI Neck: supple, no JVD or thyromegaly Chest: CTA bilaterally, no wheezes, crackles, or other adventitious sounds CV: RRR, no m/g/c/r Abd: soft, NT, mild "crampy" discomfort to palpation in RLQ > LLQ, +BS in all four quadrants; no HSM, guarding, ridigity, or rebound tenderness Ext: no edema, well perfused with 2+ pulses, Skin: no rash or lesions noted Lymph: no LAD  Data: Lab Results  Component Value Date   WBC 16.1* 07/01/2015   HGB 10.7* 07/01/2015   HCT 32.3* 07/01/2015   MCV 93.5 07/01/2015   PLT 122* 07/01/2015    Recent Labs Lab 07/01/15 2103  HGB 10.7*   Lab Results  Component  Value Date   NA 140 07/03/2015   K 2.9* 07/03/2015   CL 108 07/03/2015   CO2 22 07/03/2015   BUN 8 07/03/2015   CREATININE 0.91 07/03/2015   Lab Results  Component Value Date   ALT 15* 07/01/2015   AST 60* 07/01/2015   ALKPHOS 189* 07/01/2015   BILITOT 2.8* 07/01/2015   No results for input(s): APTT, INR, PTT in the last 168 hours.   Assessment/Plan: Mr. Leather is a 44 y.o. male admitted for ongoing diarrhea, dehydration, and weakness.  Patient reports he is not currently taking lactulose, cholestryamine, or imodium as directed upon discharge from last hospitalization.  C. diff antigen and toxin negative. Patient was empirically started on IV Flagyl 500mg  q 8 hrs.  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  There is also discrepancy between patient's accounts of stool consistency/frequency and nursing reports.  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  We will continue to follow with you. Please call with questions or concerns.  Lavera Guise, PA-C

## 2015-07-03 NOTE — Progress Notes (Signed)
PT Cancellation Note  Patient Details Name: Troy [name] MRN: [id number] DOB: 06/18/1971   Cancelled Treatment:    Reason Eval/Treat Not Completed: Medical issues which prohibited therapy;Other (comment) (Please see PT note for further details ) Pt held from PT this morning due to low potassium. Will wait for resolve of hypokalemia and attempt again this PM.    [name] 07/03/2015, 9:50 AM  [name], SPT. [phone]

## 2015-07-03 NOTE — Clinical Social Work Placement (Signed)
   CLINICAL SOCIAL WORK PLACEMENT  NOTE  Date:  07/03/2015  Patient Details  Name: Troy [name] MRN: [id number] Date of Birth: 23-Aug-1971  Clinical Social Work is seeking post-discharge placement for this patient at the [place] level of care (*CSW will initial, date and re-position this form in  chart as items are completed):  Yes   Patient/family provided with [facility] Work Department's list of facilities offering this level of care within the geographic area requested by the patient (or if unable, by the patient's family).  Yes   Patient/family informed of their freedom to choose among providers that offer the needed level of care, that participate in Medicare, Medicaid or managed care program needed by the patient, have an available bed and are willing to accept the patient.  Yes   Patient/family informed of [facility]'s ownership interest in [facility] and [facility], as well as of the fact that they are under no obligation to receive care at these facilities.  PASRR submitted to EDS on       PASRR number received on       Existing PASRR number confirmed on 07/03/15     FL2 transmitted to all facilities in geographic area requested by pt/family on 07/03/15     FL2 transmitted to all facilities within larger geographic area on       Patient informed that his/her managed care company has contracts with or will negotiate with certain facilities, including the following:            Patient/family informed of bed offers received.  Patient chooses bed at       Physician recommends and patient chooses bed at      Patient to be transferred to   on  .  Patient to be transferred to facility by       Patient family notified on   of transfer.  Name of family member notified:        PHYSICIAN Please sign FL2     Additional Comment:    _______________________________________________ Loralyn Freshwater, LCSW 07/03/2015, 5:33 PM

## 2015-07-03 NOTE — Progress Notes (Signed)
ANTIBIOTIC CONSULT NOTE - FOLLOW UP  Pharmacy Consult for Vancomycin and Zosyn Indication: Sepsis  No Known Allergies  Patient Measurements: Height: 5\' 8"  (172.7 cm) Weight: 202 lb 14.4 oz (92.035 kg) IBW/kg (Calculated) : 68.4 Adjusted Body Weight: n/a  Vital Signs: Temp: 98 F (36.7 C) (07/19 1123) Temp Source: Oral (07/19 1123) BP: 90/55 mmHg (07/19 1123) Pulse Rate: 97 (07/19 1123) Intake/Output from previous day: 07/18 0701 - 07/19 0700 In: 1858 [I.V.:908; IV Piggyback:950] Out: 750 [Urine:750] Intake/Output from this shift: Total I/O In: 120 [P.O.:120] Out: 3 [Stool:3]  Labs:  Recent Labs  07/01/15 2103 07/03/15 0736  WBC 16.1*  --   HGB 10.7*  --   PLT 122*  --   CREATININE 0.83 0.91   Estimated Creatinine Clearance: 114 mL/min (by C-G formula based on Cr of 0.91).  Recent Labs  07/03/15 0736  VANCOTROUGH 41*     Microbiology: Recent Results (from the past 720 hour(s))  Blood Culture (routine x 2)     Status: None   Collection Time: 06/06/15  4:14 PM  Result Value Ref Range Status   Specimen Description BLOOD  Final   Special Requests NONE  Final   Culture NO GROWTH 5 DAYS  Final   Report Status 06/11/2015 FINAL  Final  Stool culture     Status: None   Collection Time: 06/06/15  4:14 PM  Result Value Ref Range Status   Specimen Description STOOL  Final   Special Requests Normal  Final   Culture   Final    NO SALMONELLA OR SHIGELLA ISOLATED No Pathogenic E. coli detected    Report Status 06/10/2015 FINAL  Final  C difficile quick scan w PCR reflex ([facility] only)     Status: None   Collection Time: 06/06/15  4:20 PM  Result Value Ref Range Status   C Diff antigen POSITIVE  Final   C Diff toxin NEGATIVE  Final   C Diff interpretation   Final    Positive for toxigenic C. difficile, active toxin production not detected. Patient has toxigenic C. difficile organisms present in the bowel, but toxin was not detected. The patient may be a carrier or  the level of toxin in the sample was below the limit  of detection. This information should be used in conjunction with the patient's clinical history when deciding on possible therapy.     Comment: CRITICAL RESULT CALLED TO, READ BACK BY AND VERIFIED WITH: Adnan Vanvoorhis THOMPSON 06/06/15 AT 2033.SDR   Blood Culture (routine x 2)     Status: None   Collection Time: 06/06/15  4:20 PM  Result Value Ref Range Status   Specimen Description BLOOD  Final   Special Requests NONE  Final   Culture  Setup Time   Final    GRAM POSITIVE RODS ANAEROBIC BOTTLE ONLY CRITICAL RESULT CALLED TO, READ BACK BY AND VERIFIED WITH: Kathi Simpers AT 0383 06/07/15 DV    Culture   Final    CLOSTRIDIUM PARAPUTRIFICUM ANAEROBIC BOTTLE ONLY COAGULASE NEGATIVE STAPHYLOCOCCUS AEROBIC BOTTLE ONLY POSSIBLE CONTAMINATION WITH SKIN FLORA     Report Status 06/12/2015 FINAL  Final   Organism ID, Bacteria CLOSTRIDIUM PARAPUTRIFICUM  Final  Clostridium Difficile by PCR (not at [facility])     Status: Abnormal   Collection Time: 06/06/15  4:20 PM  Result Value Ref Range Status   C difficile by pcr POSITIVE (A) NEGATIVE Final    Comment: Positive for toxigenic C. difficile, active toxin production not detected. Patient  has toxigenic C. difficile organisms present in the bowel, but toxin was not detected. The patient may be a carrier or the level of toxin in the sample was below the limit  of detection. This information should be used in conjunction with the patient's clinical history when deciding on possible therapy. CRITICAL RESULT CALLED TO, READ BACK BY AND VERIFIED WITH: Ansar Skoda THOMPSON 06/06/15 2030. SDW   Giardia, EIA; Ova/Parasite     Status: None   Collection Time: 06/06/15  5:09 PM  Result Value Ref Range Status   Ova + Parasite Exam Final report  Final    Comment: (NOTE) These results were obtained using wet preparation(s) and trichrome stained smear. This test does not include testing for Cryptosporidium parvum,  Cyclospora, or Microsporidia.    Giardia Ag, Stl Negative Negative Final    Comment: (NOTE) Performed At: [place], [place] [id number] Caprice Red MD [id number] Performed At: [facility] [place], [place] [id number] Lindon Romp MD XN:[id number]    Source of Sample STOOL  Corrected  MRSA PCR Screening     Status: None   Collection Time: 06/06/15  9:00 PM  Result Value Ref Range Status   MRSA by PCR NEGATIVE NEGATIVE Final    Comment:        The GeneXpert MRSA Assay (FDA approved for NASAL specimens only), is one component of a comprehensive MRSA colonization surveillance program. It is not intended to diagnose MRSA infection nor to guide or monitor treatment for MRSA infections.   Urine culture     Status: None   Collection Time: 06/07/15  3:41 PM  Result Value Ref Range Status   Specimen Description URINE, CLEAN CATCH  Final   Special Requests NONE  Final   Culture NO GROWTH 2 DAYS  Final   Report Status 06/09/2015 FINAL  Final  C difficile quick scan w PCR reflex ([facility] only)     Status: None   Collection Time: 06/12/15  4:21 PM  Result Value Ref Range Status   C Diff antigen NEGATIVE  Final   C Diff toxin NEGATIVE  Final   C Diff interpretation Negative for C. difficile  Final  C difficile quick scan w PCR reflex ([facility] only)     Status: None   Collection Time: 06/18/15 10:30 PM  Result Value Ref Range Status   C Diff antigen NEGATIVE  Final   C Diff toxin NEGATIVE  Final   C Diff interpretation Negative for C. difficile  Final  Culture, blood (routine x 2)     Status: None (Preliminary result)   Collection Time: 07/01/15 10:24 PM  Result Value Ref Range Status   Specimen Description BLOOD LEFT ARM  Final   Special Requests BOTTLES DRAWN AEROBIC AND ANAEROBIC 10CC  Final   Culture NO GROWTH 2 DAYS  Final   Report Status PENDING  Incomplete  Culture, blood (routine x 2)     Status: None  (Preliminary result)   Collection Time: 07/01/15 10:30 PM  Result Value Ref Range Status   Specimen Description BLOOD RIGHT ARM  Final   Special Requests BOTTLES DRAWN AEROBIC AND ANAEROBIC 10CC  Final   Culture NO GROWTH 2 DAYS  Final   Report Status PENDING  Incomplete  C difficile quick scan w PCR reflex ([facility] only)     Status: None   Collection Time: 07/03/15  8:30 AM  Result Value Ref Range Status   C Diff  antigen NEGATIVE  Final   C Diff toxin NEGATIVE  Final   C Diff interpretation Negative for C. difficile  Final    Anti-infectives    Start     Dose/Rate Route Frequency Ordered Stop   07/03/15 2000  vancomycin (VANCOCIN) 1,250 mg in sodium chloride 0.9 % 250 mL IVPB     1,250 mg 166.7 mL/hr over 90 Minutes Intravenous Every 18 hours 07/03/15 0911     07/02/15 1900  metroNIDAZOLE (FLAGYL) IVPB 500 mg     500 mg 100 mL/hr over 60 Minutes Intravenous Every 8 hours 07/02/15 1851     07/02/15 0900  piperacillin-tazobactam (ZOSYN) IVPB 3.375 g     3.375 g 12.5 mL/hr over 240 Minutes Intravenous 3 times per day 07/02/15 0245     07/02/15 0800  vancomycin (VANCOCIN) 1,250 mg in sodium chloride 0.9 % 250 mL IVPB  Status:  Discontinued     1,250 mg 166.7 mL/hr over 90 Minutes Intravenous Every 8 hours 07/02/15 0226 07/03/15 0908   07/02/15 0045  vancomycin (VANCOCIN) 1,250 mg in sodium chloride 0.9 % 250 mL IVPB     1,250 mg 166.7 mL/hr over 90 Minutes Intravenous  Once 07/02/15 0037 07/02/15 0336   07/02/15 0045  piperacillin-tazobactam (ZOSYN) IVPB 3.375 g     3.375 g 100 mL/hr over 30 Minutes Intravenous  Once 07/02/15 0037 07/02/15 0306      Assessment: Patient currently ordered Vancomycin 1250mg  IV q8h. Vancomycin trough resulted at 2mcg/ml.  Patient continues on Zosyn 3.375g IV q8h EI as well as Metronidazole 500mg  IV q8h.  Goal of Therapy:  Vancomycin trough level 15-20 mcg/ml  Plan:  Measure antibiotic drug levels at steady state Follow up culture results Will  continue with current Zosyn dosing.  Will decrease to Vancomycin 1250mg  IV q18h, will check a level prior to dose being given to ensure patient has cleared drug from previous dosing.  Paulina Fusi, PharmD, BCPS 07/03/2015 12:06 PM

## 2015-07-03 NOTE — Progress Notes (Signed)
Dr. Margaretmary Eddy notified this morning of potassium of 2.7, patient is already receiving potassium IV and PO, potassium results later showed improvement. Continue to monitor.

## 2015-07-03 NOTE — Progress Notes (Signed)
ANTIBIOTIC CONSULT NOTE - FOLLOW UP  Pharmacy Consult for Vancomycin Indication: Sepsis  No Known Allergies  Patient Measurements: Height: 5\' 8"  (172.7 cm) Weight: 202 lb 14.4 oz (92.035 kg) IBW/kg (Calculated) : 68.4   Vital Signs: Temp: 98.5 F (36.9 C) (07/19 1630) Temp Source: Oral (07/19 1630) BP: 102/75 mmHg (07/19 1630) Pulse Rate: 112 (07/19 1630) Intake/Output from previous day: 07/18 0701 - 07/19 0700 In: 1858 [I.V.:908; IV Piggyback:950] Out: 750 [Urine:750] Intake/Output from this shift:    Labs:  Recent Labs  07/01/15 2103 07/03/15 0736 07/03/15 1005  WBC 16.1*  --  6.7  HGB 10.7*  --  8.4*  PLT 122*  --  50*  CREATININE 0.83 0.91  --    Estimated Creatinine Clearance: 114 mL/min (by C-G formula based on Cr of 0.91).  Recent Labs  07/03/15 0736 07/03/15 1801  VANCOTROUGH 41* 34*     Microbiology: Recent Results (from the past 720 hour(s))  Blood Culture (routine x 2)     Status: None   Collection Time: 06/06/15  4:14 PM  Result Value Ref Range Status   Specimen Description BLOOD  Final   Special Requests NONE  Final   Culture NO GROWTH 5 DAYS  Final   Report Status 06/11/2015 FINAL  Final  Stool culture     Status: None   Collection Time: 06/06/15  4:14 PM  Result Value Ref Range Status   Specimen Description STOOL  Final   Special Requests Normal  Final   Culture   Final    NO SALMONELLA OR SHIGELLA ISOLATED No Pathogenic E. coli detected    Report Status 06/10/2015 FINAL  Final  C difficile quick scan w PCR reflex ([facility] only)     Status: None   Collection Time: 06/06/15  4:20 PM  Result Value Ref Range Status   C Diff antigen POSITIVE  Final   C Diff toxin NEGATIVE  Final   C Diff interpretation   Final    Positive for toxigenic C. difficile, active toxin production not detected. Patient has toxigenic C. difficile organisms present in the bowel, but toxin was not detected. The patient may be a carrier or the level of toxin in the  sample was below the limit  of detection. This information should be used in conjunction with the patient's clinical history when deciding on possible therapy.     Comment: CRITICAL RESULT CALLED TO, READ BACK BY AND VERIFIED WITH: LISA THOMPSON 06/06/15 AT 2033.SDR   Blood Culture (routine x 2)     Status: None   Collection Time: 06/06/15  4:20 PM  Result Value Ref Range Status   Specimen Description BLOOD  Final   Special Requests NONE  Final   Culture  Setup Time   Final    GRAM POSITIVE RODS ANAEROBIC BOTTLE ONLY CRITICAL RESULT CALLED TO, READ BACK BY AND VERIFIED WITH: Kathi Simpers AT 4536 06/07/15 DV    Culture   Final    CLOSTRIDIUM PARAPUTRIFICUM ANAEROBIC BOTTLE ONLY COAGULASE NEGATIVE STAPHYLOCOCCUS AEROBIC BOTTLE ONLY POSSIBLE CONTAMINATION WITH SKIN FLORA     Report Status 06/12/2015 FINAL  Final   Organism ID, Bacteria CLOSTRIDIUM PARAPUTRIFICUM  Final  Clostridium Difficile by PCR (not at [facility])     Status: Abnormal   Collection Time: 06/06/15  4:20 PM  Result Value Ref Range Status   C difficile by pcr POSITIVE (A) NEGATIVE Final    Comment: Positive for toxigenic C. difficile, active toxin production not detected. Patient  has toxigenic C. difficile organisms present in the bowel, but toxin was not detected. The patient may be a carrier or the level of toxin in the sample was below the limit  of detection. This information should be used in conjunction with the patient's clinical history when deciding on possible therapy. CRITICAL RESULT CALLED TO, READ BACK BY AND VERIFIED WITH: LISA THOMPSON 06/06/15 2030. SDW   Giardia, EIA; Ova/Parasite     Status: None   Collection Time: 06/06/15  5:09 PM  Result Value Ref Range Status   Ova + Parasite Exam Final report  Final    Comment: (NOTE) These results were obtained using wet preparation(s) and trichrome stained smear. This test does not include testing for Cryptosporidium parvum, Cyclospora, or  Microsporidia.    Giardia Ag, Stl Negative Negative Final    Comment: (NOTE) Performed At: [place], [place] [id number] Caprice Red MD NI:[id number] Performed At: [facility] [place], [place] [id number] Lindon Romp MD QQ:[id number]    Source of Sample STOOL  Corrected  MRSA PCR Screening     Status: None   Collection Time: 06/06/15  9:00 PM  Result Value Ref Range Status   MRSA by PCR NEGATIVE NEGATIVE Final    Comment:        The GeneXpert MRSA Assay (FDA approved for NASAL specimens only), is one component of a comprehensive MRSA colonization surveillance program. It is not intended to diagnose MRSA infection nor to guide or monitor treatment for MRSA infections.   Urine culture     Status: None   Collection Time: 06/07/15  3:41 PM  Result Value Ref Range Status   Specimen Description URINE, CLEAN CATCH  Final   Special Requests NONE  Final   Culture NO GROWTH 2 DAYS  Final   Report Status 06/09/2015 FINAL  Final  C difficile quick scan w PCR reflex ([facility] only)     Status: None   Collection Time: 06/12/15  4:21 PM  Result Value Ref Range Status   C Diff antigen NEGATIVE  Final   C Diff toxin NEGATIVE  Final   C Diff interpretation Negative for C. difficile  Final  C difficile quick scan w PCR reflex ([facility] only)     Status: None   Collection Time: 06/18/15 10:30 PM  Result Value Ref Range Status   C Diff antigen NEGATIVE  Final   C Diff toxin NEGATIVE  Final   C Diff interpretation Negative for C. difficile  Final  Culture, blood (routine x 2)     Status: None (Preliminary result)   Collection Time: 07/01/15 10:24 PM  Result Value Ref Range Status   Specimen Description BLOOD LEFT ARM  Final   Special Requests BOTTLES DRAWN AEROBIC AND ANAEROBIC 10CC  Final   Culture NO GROWTH 2 DAYS  Final   Report Status PENDING  Incomplete  Culture, blood (routine x 2)     Status: None (Preliminary result)    Collection Time: 07/01/15 10:30 PM  Result Value Ref Range Status   Specimen Description BLOOD RIGHT ARM  Final   Special Requests BOTTLES DRAWN AEROBIC AND ANAEROBIC 10CC  Final   Culture NO GROWTH 2 DAYS  Final   Report Status PENDING  Incomplete  C difficile quick scan w PCR reflex ([facility] only)     Status: None   Collection Time: 07/03/15  8:30 AM  Result Value Ref Range Status   C Diff  antigen NEGATIVE  Final   C Diff toxin NEGATIVE  Final   C Diff interpretation Negative for C. difficile  Final    Anti-infectives    Start     Dose/Rate Route Frequency Ordered Stop   07/05/15 0600  vancomycin (VANCOCIN) IVPB 1000 mg/200 mL premix     1,000 mg 200 mL/hr over 60 Minutes Intravenous Every 36 hours 07/03/15 1900     07/03/15 2000  vancomycin (VANCOCIN) 1,250 mg in sodium chloride 0.9 % 250 mL IVPB  Status:  Discontinued     1,250 mg 166.7 mL/hr over 90 Minutes Intravenous Every 18 hours 07/03/15 0911 07/03/15 1849   07/02/15 1900  metroNIDAZOLE (FLAGYL) IVPB 500 mg     500 mg 100 mL/hr over 60 Minutes Intravenous Every 8 hours 07/02/15 1851     07/02/15 0900  piperacillin-tazobactam (ZOSYN) IVPB 3.375 g     3.375 g 12.5 mL/hr over 240 Minutes Intravenous 3 times per day 07/02/15 0245     07/02/15 0800  vancomycin (VANCOCIN) 1,250 mg in sodium chloride 0.9 % 250 mL IVPB  Status:  Discontinued     1,250 mg 166.7 mL/hr over 90 Minutes Intravenous Every 8 hours 07/02/15 0226 07/03/15 0908   07/02/15 0045  vancomycin (VANCOCIN) 1,250 mg in sodium chloride 0.9 % 250 mL IVPB     1,250 mg 166.7 mL/hr over 90 Minutes Intravenous  Once 07/02/15 0037 07/02/15 0336   07/02/15 0045  piperacillin-tazobactam (ZOSYN) IVPB 3.375 g     3.375 g 100 mL/hr over 30 Minutes Intravenous  Once 07/02/15 0037 07/02/15 0306      Assessment: Patient previously on vancomycin 1250 mg iv q 8 h with elevated trough of 41 mcg/ml. Of note, patient is on Lasix. Subsequent trough resulted at 34 mcg/ml with  calculated ke of 0.0178.  Goal of Therapy:  Vancomycin trough level 15-20 mcg/ml  Plan:  Will redose vancomycin at 1000 mg iv q 36 h based on calculated kinetics. Will likely need to dose by levels.   Ulice Dash D 07/03/2015,7:10 PM

## 2015-07-03 NOTE — Progress Notes (Signed)
PT recommended SNF today. Clinical Social Worker (CSW) met with patient to discuss SNF placement. Patient reported that he went to [facility] in the past and would like to return there if he needs to. Patient reported that he prefers to go home and believes he will improve over the next several days. SNF search started. CSW will continue to follow and assist as needed.   Blima Rich, [place] [phone]

## 2015-07-03 NOTE — Plan of Care (Signed)
Problem: Problem: Bowel/Bladder Progression Goal: BOWEL SOUNDS PRESENT/NON-DECREASED Outcome: Completed/Met Date Met:  07/03/15 Pt with active bowel sounds

## 2015-07-03 NOTE — Progress Notes (Signed)
[place] [facility] [place] NAME: Troy [name]    MR#:  [id number]  DATE OF BIRTH:  1971/04/20  SUBJECTIVE:  CHIEF COMPLAINT:  Pt is still having diarrhea, sent for c-diff  REVIEW OF SYSTEMS:  CONSTITUTIONAL: No fever, reports fatigue and weakness.  EYES: No blurred or double vision.  EARS, NOSE, AND THROAT: No tinnitus or ear pain.  RESPIRATORY: No cough, shortness of breath, wheezing or hemoptysis.  CARDIOVASCULAR: No chest pain, orthopnea, edema.  GASTROINTESTINAL: has nausea and, diarrhea. Denies  abdominal pain or vomiting GENITOURINARY: No dysuria, hematuria. Excoriations of gluteal area from ch diarrhea ENDOCRINE: No polyuria, nocturia,  HEMATOLOGY: No anemia, easy bruising or bleeding SKIN: No rash or lesion. MUSCULOSKELETAL: rt hip  joint pain .   NEUROLOGIC: No tingling, numbness, weakness.  PSYCHIATRY: No anxiety or depression.   DRUG ALLERGIES:  No Known Allergies  VITALS:  Blood pressure 90/55, pulse 97, temperature 98 F (36.7 C), temperature source Oral, resp. rate 20, height 5\' 8"  (1.727 m), weight 92.035 kg (202 lb 14.4 oz), SpO2 99 %.  PHYSICAL EXAMINATION:  GENERAL:  44 y.o.-year-old patient lying in the bed with no acute distress. Thin looking, frial  EYES: Pupils equal, round, reactive to light and accommodation. No scleral icterus. Extraocular muscles intact.  HEENT: Head atraumatic, normocephalic. Oropharynx and nasopharynx clear.  NECK:  Supple, no jugular venous distention. No thyroid enlargement, no tenderness.  LUNGS: Normal breath sounds bilaterally, no wheezing, rales,rhonchi or crepitation. No use of accessory muscles of respiration.  CARDIOVASCULAR: S1, S2 normal. No murmurs, rubs, or gallops.  ABDOMEN: Soft, nontender, nondistended. Bowel sounds present. No organomegaly or mass.  EXTREMITIES: No pedal edema, cyanosis, or clubbing.  NEUROLOGIC: Cranial nerves II through XII are intact. Sensation intact.  Gait not checked.  PSYCHIATRIC: The patient is alert and oriented x 3.  SKIN: No obvious rash, lesion, or ulcer. Erythematous and macerated in the peri anal area - examined with RN Estill Bamberg as chaperon   LABORATORY PANEL:   CBC  Recent Labs Lab 07/03/15 1005  WBC 6.7  HGB 8.4*  HCT 25.6*  PLT 50*   ------------------------------------------------------------------------------------------------------------------  Chemistries   Recent Labs Lab 07/01/15 2103  07/03/15 0736 07/03/15 1005  NA 137  --  140  --   K 3.0*  < > 2.9* 3.7  CL 94*  --  108  --   CO2 25  --  22  --   GLUCOSE 148*  --  102*  --   BUN 16  --  8  --   CREATININE 0.83  --  0.91  --   CALCIUM 7.9*  --  7.4*  --   MG 0.8*  < > 1.3* 1.3*  AST 60*  --   --   --   ALT 15*  --   --   --   ALKPHOS 189*  --   --   --   BILITOT 2.8*  --   --   --   < > = values in this interval not displayed. ------------------------------------------------------------------------------------------------------------------  Cardiac Enzymes  Recent Labs Lab 07/01/15 2103  TROPONINI 0.03   ------------------------------------------------------------------------------------------------------------------  RADIOLOGY:  Dg Hip Unilat With Pelvis 2-3 Views Right  07/01/2015   CLINICAL DATA:  Right hip pain following recent fall, initial encounter  EXAM: DG HIP (WITH OR WITHOUT PELVIS) 2-3V RIGHT  COMPARISON:  None.  FINDINGS: There are changes consistent with prior left hip replacement. Significant remodeling of the  right femoral head is noted consistent with avascular necrosis. Subchondral sclerosis in the acetabulum is seen. No acute fracture or dislocation is noted. The pelvic ring is intact.  IMPRESSION: Changes consistent with avascular necrosis. No acute abnormality is noted.   Electronically Signed   By: Inez Catalina M.D.   On: 07/01/2015 22:00    EKG:   Orders placed or performed during the hospital encounter of 07/01/15   . ED EKG  . ED EKG  . EKG 12-Lead  . EKG 12-Lead    ASSESSMENT AND PLAN:   Assessment/Plan This is a 44 year old Caucasian male admitted for sepsis, weakness and diarrhea. 1. Sepsis: The patient meets septic criteria with tachycardi leukocytosis and diarrhea. Continue  Vancomycin, flagyl  and Zosyn.  Blood cultures have been negative so far  2. SevereWeakness: Likely secondary to sepsis, dehydration and hypokalemia.  We will replete the patient's potassium and electrolytes hydrate him with intravenous fluid.  Physical therapy consultation ordered . 3. Diarrhea: The patient has had chronic loose stool for more than a month.  At one point he was being treated for C. difficile colitis but on his most recent hospitalization C. difficile assay was negative.  The patient was given Dificid during previous hospitalization and he stopped using it after d/c as [place] didn't have dificid , was not started  by admitting physician as no indication of C. difficile.  Enterotoxin assay ordered in the emergency department. on probiotics. GI consult pending, talked to dr.Skulskie, said dr.Ryan  Will follow this patient Nystatin powder for excoriation and dermatitis  4. 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   5. Hip pain: sec to Avascular necrosis.  We will manage pain with pain meds .  Patient is status post left hip replacement and dr.Menz is recommending OP RHA  6. Weakness/FTT- PT eval  7. DVT prophylaxis: Heparin 8. GI prophylaxis: None     All the records are reviewed and case discussed with Care Management/Social Workerr. Management plans discussed with the patient, family and they are in agreement.  CODE STATUS: full   TOTAL TIME TAKING CARE OF THIS PATIENT: 35 minutes.   POSSIBLE D/C IN 2-3 DAYS, DEPENDING ON CLINICAL CONDITION.   Nicholes Mango M.D on 07/03/2015 at 4:46 PM  Between 7am to 6pm - Pager - [phone] After  6pm go to [email or web address] - password [email or web address] [facility]  [facility]  Office  [phone]  CC: Primary care physician; No PCP Per Patient

## 2015-07-03 NOTE — Plan of Care (Signed)
Problem: Problem: Bowel/Bladder Progression Goal: PATIENT IS CONTINENT Outcome: Not Progressing Still with inconant episodes

## 2015-07-04 LAB — VANCOMYCIN, TROUGH: Vancomycin Tr: 34 ug/mL (ref 10.0–20.0)

## 2015-07-04 LAB — BASIC METABOLIC PANEL
Anion gap: 10 (ref 5–15)
BUN: 9 mg/dL (ref 6–20)
CO2: 21 mmol/L — AB (ref 22–32)
CREATININE: 2.05 mg/dL — AB (ref 0.61–1.24)
Calcium: 7.6 mg/dL — ABNORMAL LOW (ref 8.9–10.3)
Chloride: 108 mmol/L (ref 101–111)
GFR calc non Af Amer: 38 mL/min — ABNORMAL LOW (ref >60)
GFR, EST AFRICAN AMERICAN: 44 mL/min — AB (ref >60)
Glucose, Bld: 102 mg/dL — ABNORMAL HIGH (ref 65–99)
Potassium: 4.1 mmol/L (ref 3.5–5.1)
Sodium: 139 mmol/L (ref 135–145)

## 2015-07-04 LAB — VANCOMYCIN, RANDOM: Vancomycin Rm: 25 ug/mL

## 2015-07-04 LAB — MAGNESIUM: MAGNESIUM: 1.5 mg/dL — AB (ref 1.7–2.4)

## 2015-07-04 MED ORDER — VANCOMYCIN HCL IN DEXTROSE 1-5 GM/200ML-% IV SOLN
1000.0000 mg | INTRAVENOUS | Status: DC
  Filled 2015-07-04: qty 200

## 2015-07-04 MED ORDER — POLYETHYLENE GLYCOL 3350 17 GM/SCOOP PO POWD
1.0000 | Freq: Once | ORAL | Status: AC
  Administered 2015-07-04: 255 g via ORAL
  Filled 2015-07-04: qty 255

## 2015-07-04 MED ORDER — PIPERACILLIN-TAZOBACTAM 3.375 G IVPB
3.3750 g | Freq: Two times a day (BID) | INTRAVENOUS | Status: DC
  Administered 2015-07-04 – 2015-07-07 (×6): 3.375 g via INTRAVENOUS
  Filled 2015-07-04 (×8): qty 50

## 2015-07-04 NOTE — Progress Notes (Addendum)
ANTIBIOTIC CONSULT NOTE - FOLLOW UP  Pharmacy Consult for Vancomycin and Zosyn  Indication: Sepsis  No Known Allergies  Patient Measurements: Height: 5\' 8"  (172.7 cm) Weight: 202 lb 14.4 oz (92.035 kg) IBW/kg (Calculated) : 68.4   Vital Signs: Temp: 97.7 F (36.5 C) (07/20 0806) Temp Source: Oral (07/20 0806) BP: 99/60 mmHg (07/20 0806) Pulse Rate: 94 (07/20 0806) Intake/Output from previous day: 07/19 0701 - 07/20 0700 In: 1807.6 [P.O.:340; I.V.:1467.6] Out: 903 [Urine:900; Stool:3] Intake/Output from this shift: Total I/O In: 240 [P.O.:240] Out: -   Labs:  Recent Labs  07/01/15 2103 07/03/15 0736 07/03/15 1005 07/04/15 0609  WBC 16.1*  --  6.7  --   HGB 10.7*  --  8.4*  --   PLT 122*  --  50*  --   CREATININE 0.83 0.91  --  2.05*   Estimated Creatinine Clearance: 50.6 mL/min (by C-G formula based on Cr of 2.05).  Recent Labs  07/03/15 0736 07/03/15 1801  VANCOTROUGH 41* 34*     Microbiology: Recent Results (from the past 720 hour(s))  Blood Culture (routine x 2)     Status: None   Collection Time: 06/06/15  4:14 PM  Result Value Ref Range Status   Specimen Description BLOOD  Final   Special Requests NONE  Final   Culture NO GROWTH 5 DAYS  Final   Report Status 06/11/2015 FINAL  Final  Stool culture     Status: None   Collection Time: 06/06/15  4:14 PM  Result Value Ref Range Status   Specimen Description STOOL  Final   Special Requests Normal  Final   Culture   Final    NO SALMONELLA OR SHIGELLA ISOLATED No Pathogenic E. coli detected    Report Status 06/10/2015 FINAL  Final  C difficile quick scan w PCR reflex ([facility] only)     Status: None   Collection Time: 06/06/15  4:20 PM  Result Value Ref Range Status   C Diff antigen POSITIVE  Final   C Diff toxin NEGATIVE  Final   C Diff interpretation   Final    Positive for toxigenic C. difficile, active toxin production not detected. Patient has toxigenic C. difficile organisms present in the  bowel, but toxin was not detected. The patient may be a carrier or the level of toxin in the sample was below the limit  of detection. This information should be used in conjunction with the patient's clinical history when deciding on possible therapy.     Comment: CRITICAL RESULT CALLED TO, READ BACK BY AND VERIFIED WITH: LISA THOMPSON 06/06/15 AT 2033.SDR   Blood Culture (routine x 2)     Status: None   Collection Time: 06/06/15  4:20 PM  Result Value Ref Range Status   Specimen Description BLOOD  Final   Special Requests NONE  Final   Culture  Setup Time   Final    GRAM POSITIVE RODS ANAEROBIC BOTTLE ONLY CRITICAL RESULT CALLED TO, READ BACK BY AND VERIFIED WITH: BRITNEY KILLINGSWORTH AT 3154 06/07/15 DV    Culture   Final    CLOSTRIDIUM PARAPUTRIFICUM ANAEROBIC BOTTLE ONLY COAGULASE NEGATIVE STAPHYLOCOCCUS AEROBIC BOTTLE ONLY POSSIBLE CONTAMINATION WITH SKIN FLORA     Report Status 06/12/2015 FINAL  Final   Organism ID, Bacteria CLOSTRIDIUM PARAPUTRIFICUM  Final  Clostridium Difficile by PCR (not at [facility])     Status: Abnormal   Collection Time: 06/06/15  4:20 PM  Result Value Ref Range Status   C difficile  by pcr POSITIVE (A) NEGATIVE Final    Comment: Positive for toxigenic C. difficile, active toxin production not detected. Patient has toxigenic C. difficile organisms present in the bowel, but toxin was not detected. The patient may be a carrier or the level of toxin in the sample was below the limit  of detection. This information should be used in conjunction with the patient's clinical history when deciding on possible therapy. CRITICAL RESULT CALLED TO, READ BACK BY AND VERIFIED WITH: LISA THOMPSON 06/06/15 2030. SDW   Giardia, EIA; Ova/Parasite     Status: None   Collection Time: 06/06/15  5:09 PM  Result Value Ref Range Status   Ova + Parasite Exam Final report  Final    Comment: (NOTE) These results were obtained using wet preparation(s) and trichrome stained smear.  This test does not include testing for Cryptosporidium parvum, Cyclospora, or Microsporidia.    Giardia Ag, Stl Negative Negative Final    Comment: (NOTE) Performed At: [place], [place] [id number] Caprice Red MD [id number] Performed At: [facility] [place], [place] [id number] Lindon Romp MD WU:[id number]    Source of Sample STOOL  Corrected  MRSA PCR Screening     Status: None   Collection Time: 06/06/15  9:00 PM  Result Value Ref Range Status   MRSA by PCR NEGATIVE NEGATIVE Final    Comment:        The GeneXpert MRSA Assay (FDA approved for NASAL specimens only), is one component of a comprehensive MRSA colonization surveillance program. It is not intended to diagnose MRSA infection nor to guide or monitor treatment for MRSA infections.   Urine culture     Status: None   Collection Time: 06/07/15  3:41 PM  Result Value Ref Range Status   Specimen Description URINE, CLEAN CATCH  Final   Special Requests NONE  Final   Culture NO GROWTH 2 DAYS  Final   Report Status 06/09/2015 FINAL  Final  C difficile quick scan w PCR reflex ([facility] only)     Status: None   Collection Time: 06/12/15  4:21 PM  Result Value Ref Range Status   C Diff antigen NEGATIVE  Final   C Diff toxin NEGATIVE  Final   C Diff interpretation Negative for C. difficile  Final  C difficile quick scan w PCR reflex ([facility] only)     Status: None   Collection Time: 06/18/15 10:30 PM  Result Value Ref Range Status   C Diff antigen NEGATIVE  Final   C Diff toxin NEGATIVE  Final   C Diff interpretation Negative for C. difficile  Final  Culture, blood (routine x 2)     Status: None (Preliminary result)   Collection Time: 07/01/15 10:24 PM  Result Value Ref Range Status   Specimen Description BLOOD LEFT ARM  Final   Special Requests BOTTLES DRAWN AEROBIC AND ANAEROBIC 10CC  Final   Culture NO GROWTH 2 DAYS  Final   Report Status PENDING   Incomplete  Culture, blood (routine x 2)     Status: None (Preliminary result)   Collection Time: 07/01/15 10:30 PM  Result Value Ref Range Status   Specimen Description BLOOD RIGHT ARM  Final   Special Requests BOTTLES DRAWN AEROBIC AND ANAEROBIC 10CC  Final   Culture NO GROWTH 2 DAYS  Final   Report Status PENDING  Incomplete  C difficile quick scan w PCR reflex ([facility] only)  Status: None   Collection Time: 07/03/15  8:30 AM  Result Value Ref Range Status   C Diff antigen NEGATIVE  Final   C Diff toxin NEGATIVE  Final   C Diff interpretation Negative for C. difficile  Final    Anti-infectives    Start     Dose/Rate Route Frequency Ordered Stop   07/05/15 0600  vancomycin (VANCOCIN) IVPB 1000 mg/200 mL premix  Status:  Discontinued     1,000 mg 200 mL/hr over 60 Minutes Intravenous Every 36 hours 07/03/15 1900 07/04/15 1005   07/04/15 1830  piperacillin-tazobactam (ZOSYN) IVPB 3.375 g     3.375 g 12.5 mL/hr over 240 Minutes Intravenous Every 12 hours 07/04/15 0957     07/03/15 2000  vancomycin (VANCOCIN) 1,250 mg in sodium chloride 0.9 % 250 mL IVPB  Status:  Discontinued     1,250 mg 166.7 mL/hr over 90 Minutes Intravenous Every 18 hours 07/03/15 0911 07/03/15 1849   07/02/15 1900  metroNIDAZOLE (FLAGYL) IVPB 500 mg     500 mg 100 mL/hr over 60 Minutes Intravenous Every 8 hours 07/02/15 1851     07/02/15 0900  piperacillin-tazobactam (ZOSYN) IVPB 3.375 g  Status:  Discontinued     3.375 g 12.5 mL/hr over 240 Minutes Intravenous 3 times per day 07/02/15 0245 07/04/15 0957   07/02/15 0800  vancomycin (VANCOCIN) 1,250 mg in sodium chloride 0.9 % 250 mL IVPB  Status:  Discontinued     1,250 mg 166.7 mL/hr over 90 Minutes Intravenous Every 8 hours 07/02/15 0226 07/03/15 0908   07/02/15 0045  vancomycin (VANCOCIN) 1,250 mg in sodium chloride 0.9 % 250 mL IVPB     1,250 mg 166.7 mL/hr over 90 Minutes Intravenous  Once 07/02/15 0037 07/02/15 0336   07/02/15 0045   piperacillin-tazobactam (ZOSYN) IVPB 3.375 g     3.375 g 100 mL/hr over 30 Minutes Intravenous  Once 07/02/15 0037 07/02/15 0306      Assessment: Patient previously on vancomycin 1250 mg iv q 8 h with elevated trough of 41 mcg/ml. Of note, patient is on Lasix. Subsequent trough resulted at 34 mcg/ml with calculated ke of 0.0178.  Scr: up to 2.0 mg/dl. Likely cause of the high Vancomycin level. Random level ordered to be drawn @ 1200.  Goal of Therapy:  Vancomycin trough level 15-20 mcg/ml  Plan:  Patient currently on Vancomycin 1 g IV q36 hours.  Will change Zosyn to 3.375 g IV q12 hours due to patient's AKI.  Demri Poulton D 07/04/2015,10:06 AM

## 2015-07-04 NOTE — Care Management Note (Signed)
Case Management Note  Patient Details  Name: Troy [name] MRN: [id number] Date of Birth: 12/14/1971  Subjective/Objective:                  Spoke with patient regarding readmissions, home health,  and alcohol abuse. Patient does not admit to abusing alcohol stating he "only drinks a little bit". Patient does not have a PCP as Dr. Malena Edman has retired. He requests appointment established with [facility]. He states he is able to drive. He states he is dependent on walker for ambulation.He has a rollator and a regular walker.  He lives alone. He and his wife are separated. Court date 07/17/15. List of home health agencies and list of personal care providers left with patient.   Action/Plan: Appointment made with [facility] 8/4 at [phone]. He also have an appointment with Dr. Rayann Heman that same day in the afternoon. Patient agrees to home health and would like to use [place]. Referral made to [facility] with [place]. Patient aware of appointments. He doesn't think he has a "drinking problem".   Expected Discharge Date:  07/04/15               Expected Discharge Plan:     In-House Referral:  Clinical Social Work  Discharge planning Services  CM Consult  Post Acute Care Choice:  Home Health Choice offered to:  Patient  DME Arranged:    DME Agency:     HH Arranged:  PT, RN [place] Agency:  [place]  Status of Service:  In process, will continue to follow  Medicare Important Message Given:    Date Medicare IM Given:    Medicare IM give by:    Date Additional Medicare IM Given:    Additional Medicare Important Message give by:     If discussed at [place] of Stay Meetings, dates discussed:    Additional Comments:  Marshell Garfinkel, RN 07/04/2015, 10:36 AM

## 2015-07-04 NOTE — Progress Notes (Addendum)
[place] at [name]: Troy [name]    MR#:  [id number]  DATE OF BIRTH:  [birth year]  SUBJECTIVE:  CHIEF COMPLAINT:   Chief Complaint  Patient presents with  . Fall   Continued uncontrolled diarrhea and lower abdominal cramping  REVIEW OF SYSTEMS:   Review of Systems  Constitutional: Negative for fever.  Respiratory: Negative for shortness of breath.   Cardiovascular: Negative for chest pain and palpitations.  Gastrointestinal: Positive for abdominal pain and diarrhea. Negative for nausea, vomiting and blood in stool.  Genitourinary: Negative for dysuria.    DRUG ALLERGIES:  No Known Allergies  VITALS:  Blood pressure 99/60, pulse 94, temperature 97.7 F (36.5 C), temperature source Oral, resp. rate 16, height $RemoveBe'5\' 8"'tEevGUEFa$  (1.727 m), weight 92.035 kg (202 lb 14.4 oz), SpO2 92 %.  PHYSICAL EXAMINATION:  GENERAL:  44 y.o.-year-old patient lying in the bed with no acute distress.  EYES: Pupils equal, round, reactive to light and accommodation. No scleral icterus. Extraocular muscles intact.  HEENT: Head atraumatic, normocephalic. Oropharynx and nasopharynx clear.  Mouth dry NECK:  Supple, no jugular venous distention. No thyroid enlargement, no tenderness.  LUNGS: Normal breath sounds bilaterally, no wheezing, rales, rhonchi or crepitation. No use of accessory muscles of respiration.  CARDIOVASCULAR: S1, S2 normal. No murmurs, rubs, or gallops.  ABDOMEN: Soft, ttp in BLQ and in periumbilical area, nondistended. Bowel sounds present. No organomegaly or mass.  EXTREMITIES: No pedal edema, cyanosis, or clubbing.  NEUROLOGIC: Cranial nerves II through XII are intact. Muscle strength 5/5 in all extremities. Sensation intact.' PSYCHIATRIC: The patient is alert and oriented x 3.  SKIN: No obvious rash, lesion, or ulcer.    LABORATORY PANEL:   CBC  Recent Labs Lab 07/03/15 1005  WBC 6.7  HGB 8.4*  HCT 25.6*  PLT 50*    ------------------------------------------------------------------------------------------------------------------  Chemistries   Recent Labs Lab 07/01/15 2103  07/04/15 0609  NA 137  < > 139  K 3.0*  < > 4.1  CL 94*  < > 108  CO2 25  < > 21*  GLUCOSE 148*  < > 102*  BUN 16  < > 9  CREATININE 0.83  < > 2.05*  CALCIUM 7.9*  < > 7.6*  MG 0.8*  < > 1.5*  AST 60*  --   --   ALT 15*  --   --   ALKPHOS 189*  --   --   BILITOT 2.8*  --   --   < > = values in this interval not displayed. ------------------------------------------------------------------------------------------------------------------  Cardiac Enzymes  Recent Labs Lab 07/01/15 2103  TROPONINI 0.03   ------------------------------------------------------------------------------------------------------------------  RADIOLOGY:  Ct Abdomen Pelvis W Contrast  07/03/2015   CLINICAL DATA:  Acute onset of hypotension, tachycardia and generalized weakness. Leukocytosis and hypokalemia. History of C difficile colitis. Initial encounter.  EXAM: CT ABDOMEN AND PELVIS WITH CONTRAST  TECHNIQUE: Multidetector CT imaging of the abdomen and pelvis was performed using the standard protocol following bolus administration of intravenous contrast.  CONTRAST:  121mL OMNIPAQUE IOHEXOL 300 MG/ML  SOLN  COMPARISON:  CT of the abdomen and pelvis from 02/22/2014  FINDINGS: The visualized lung bases are clear.  Scattered varices are noted about the spleen and stomach, reflecting the patient's known hepatic cirrhosis. The spleen is enlarged, measuring 17.0 cm in length. The pancreas, gallbladder and adrenal glands are within normal limits.  Nonspecific perinephric stranding is noted bilaterally. The kidneys are otherwise grossly unremarkable. There  is no evidence of hydronephrosis. No renal or ureteral stones are seen.  The small bowel is unremarkable in appearance. The stomach is within normal limits. No acute vascular abnormalities are seen. Mild  scattered calcification is noted along the abdominal aorta.  The appendix is not well characterized.  There is diffuse wall thickening involving much of the colon, with mild sparing of the proximal sigmoid colon, compatible with the patient's known C difficile colitis. Mild surrounding soft tissue inflammation and trace free fluid are seen.  The bladder is mildly distended. Mild bladder wall thickening could reflect mild cystitis. The prostate remains normal in size. No inguinal lymphadenopathy is seen.  No acute osseous abnormalities are identified. The patient's left hip arthroplasty is grossly unremarkable in appearance, though incompletely imaged. There is partial collapse of the right femoral head, with underlying sclerosis. A likely chronic compression deformity of vertebral body of T12 is noted, with mild overlying degenerative change at T11-T12.  IMPRESSION: 1. Diffuse wall thickening involving much of the colon, with mild sparing of the proximal sigmoid colon, compatible with the patient's known C difficile colitis. Mild surrounding soft tissue inflammation and trace free fluid seen. 2. Mild bladder wall thickening could reflect mild cystitis. Would correlate for associated symptoms. 3. Splenomegaly noted. 4. Scattered splenic and gastric varices noted, reflecting the patient's known hepatic cirrhosis. 5. Mild scattered calcification along the abdominal aorta. 6. Partial collapse of the right femoral head, with underlying sclerotic change. 7. Likely chronic compression deformity of vertebral body T12, with mild associated degenerative change.   Electronically Signed   By: Garald Balding M.D.   On: 07/03/2015 21:46    EKG:   Orders placed or performed during the hospital encounter of 07/01/15  . ED EKG  . ED EKG  . EKG 12-Lead  . EKG 12-Lead    ASSESSMENT AND PLAN:   1. Sepsis: The patient met septic criteria on admission with tachycardi leukocytosis and diarrhea. 2 most recent stool studies  negative for c. Dif, ova and parasites, culture negative. Blood cultures negative. Continue Vancomycin, flagyl and Zosyn.  2. SevereWeakness: Likely secondary to sepsis, dehydration and hypokalemia.Continue to replace electrolytes. Physical therapy.  . 3. Diarrhea: The patient has had chronic loose stool for more than a month. At one point he was being treated for C. difficile colitis but on his most recent hospitalization C. difficile assay was negative. The patient was given Dificid during previous hospitalization and he stopped using it after d/c as [place] didn't have dificid , was not started by admitting physician as no indication of C. difficile. Enterotoxin assay ordered in the emergency department. on probiotics. Colonoscopy planned for tomorrow by Dr. Rayann Heman.  4. Alcohol withdrawal: Likely contributing to diarrhea as well. Denies heavy ETOH abuse.on a CIWA scale.   5. Hip pain: sec to Avascular necrosis. We will manage pain with pain meds .  Patient is status post left hip replacement and dr.Menz is recommending OP RHA  6. Acute renal failure: likely due to volume depletion. Start IVF. Avoid nephrotoxins. Recheck in am.  7. DVT prophylaxis: Heparin 8. GI prophylaxis: None  All the records are reviewed and case discussed with Care Management/Social Workerr. Management plans discussed with the patient, family and they are in agreement.  CODE STATUS: Full  TOTAL TIME TAKING CARE OF THIS PATIENT: 35 minutes.  Greater than 50% of time spent in care coordination and counseling. POSSIBLE D/C IN 2-3 DAYS, DEPENDING ON CLINICAL CONDITION.   Myrtis Ser M.D on  07/04/2015 at 11:51 AM  Between 7am to 6pm - Pager - [phone]  After 6pm go to [email or web address] - password [email or web address] [facility]  [place] [facility]  Office  [phone]  CC: Primary care physician; No PCP Per Patient

## 2015-07-04 NOTE — Progress Notes (Signed)
GI Inpatient Follow-up Note  Patient Identification: Troy [name] is a 44 y.o. male chhroic diarrhea, cirrhosis  Subjective:  Still having mult stools per hour.  Denies blood in stools.  MIld abd pain, no f/c.   Scheduled Inpatient Medications:  . folic acid  1 mg Oral Daily  . loperamide  2 mg Oral TID  . magnesium oxide  200 mg Oral BID  . magnesium sulfate 1 - 4 g bolus IVPB  1 g Intravenous Once  . metronidazole  500 mg Intravenous Q8H  . nadolol  40 mg Oral Daily  . nystatin   Topical TID  . pantoprazole  40 mg Oral Daily  . piperacillin-tazobactam (ZOSYN)  IV  3.375 g Intravenous Q12H  . polyethylene glycol powder  1 Container Oral Once  . potassium chloride SA  40 mEq Oral BID  . saccharomyces boulardii  250 mg Oral BID  . sodium chloride  3 mL Intravenous Q12H  . thiamine  100 mg Oral Daily  . [START ON 07/05/2015] vancomycin  1,000 mg Intravenous Q48H    Continuous Inpatient Infusions:   . 0.9 % NaCl with KCl 40 mEq / L 125 mL/hr (07/04/15 1342)    PRN Inpatient Medications:  liver oil-zinc oxide, morphine injection, ondansetron **OR** ondansetron (ZOFRAN) IV, pregabalin, QUEtiapine   Physical Examination: BP 93/56 mmHg  Pulse 82  Temp(Src) 97.8 F (36.6 C) (Oral)  Resp 16  Ht 5\' 8"  (1.727 m)  Wt 92.035 kg (202 lb 14.4 oz)  BMI 30.86 kg/m2  SpO2 100% Gen: NAD, alert and oriented x 4 HEENT: PEERLA, EOMI, Neck: supple, no JVD or thyromegaly Chest: CTA bilaterally, no wheezes, crackles, or other adventitious sounds CV: RRR, no m/g/c/r Abd: +obese abd, mild diffuse TTP, nabs, no r/g.  Ext: no edema, well perfused with 2+ pulses, Skin: no rash or lesions noted Lymph: no LAD  Data: Lab Results  Component Value Date   WBC 6.7 07/03/2015   HGB 8.4* 07/03/2015   HCT 25.6* 07/03/2015   MCV 96.2 07/03/2015   PLT 50* 07/03/2015    Recent Labs Lab 07/01/15 2103 07/03/15 1005  HGB 10.7* 8.4*   Lab Results  Component Value Date   NA 139 07/04/2015   K 4.1 07/04/2015   CL 108 07/04/2015   CO2 21* 07/04/2015   BUN 9 07/04/2015   CREATININE 2.05* 07/04/2015   Lab Results  Component Value Date   ALT 15* 07/01/2015   AST 60* 07/01/2015   ALKPHOS 189* 07/01/2015   BILITOT 2.8* 07/01/2015   No results for input(s): APTT, INR, PTT in the last 168 hours.   Assessment/Plan: Mr. Troy [name] is a 44 y.o. male with chronic diarhea.  C.diff neg but Ct shows diffuse colonic thickening ? UC or Crohn's.   Recommendations: - colonoscopy tomorrow - EGD at same time for anemia, dark stools, hx of large antral lesion,.  - mirala x prep tonight - npo after mn.   Please call with questions or concerns.  REIN, Grace Blight, MD

## 2015-07-04 NOTE — Progress Notes (Signed)
Physical Therapy Treatment Patient Details Name: Troy [name] MRN: [id number] DOB: 01/24/1971 Today's Date: 07/04/2015    History of Present Illness Pt is a 44 year old male who was admitted to the hospital for chronic diarrhea. Pt also has hx of alcohol abuse and liver disease.     PT Comments    Pt progressing towards goals this date. He requires less VC for proper hand/foot placement during bed mobility and transfers. He still requires significant lifting assist for transfers secondary to general weakness. Even though bowel movements limit pt's ability to ambulate this date, he was willing to practice transfer and participate in therapeutic exercise in order to build strength and endurance. He continues to benefit from skilled PT in order for eventual safe return home.   Follow Up Recommendations  SNF     Equipment Recommendations  None recommended by PT    Recommendations for Other Services       Precautions / Restrictions Precautions Precautions: Fall Precaution Comments: Enteric Restrictions Weight Bearing Restrictions: No    Mobility  Bed Mobility Overal bed mobility: Modified Independent Bed Mobility: Supine to Sit     Supine to sit: Mod assist Sit to supine: Min assist   General bed mobility comments: Requires heavy use of bedrails as well as cues for hand placement. Requires lifting support for rise from seated position  Transfers Overall transfer level: Needs assistance Equipment used: Rolling walker (2 wheeled);None Transfers: Sit to/from Stand Sit to Stand: Max assist         General transfer comment:  (Less LOB this date)  Ambulation/Gait             General Gait Details: Not attempted this date due to pt having bowel movement once in standing   Stairs            Wheelchair Mobility    Modified Rankin (Stroke Patients Only)       Balance Overall balance assessment: History of Falls   Sitting balance-Leahy Scale: Fair        Standing balance-Leahy Scale: Poor                      Cognition Arousal/Alertness: Awake/alert Behavior During Therapy: WFL for tasks assessed/performed Overall Cognitive Status: Within Functional Limits for tasks assessed                      Exercises Other Exercises Other Exercises: Pt performed bilateral LE ther-ex x12 reps with supervision for the following exercises: SLR, hip abd/add, ankle pumps, quad sets, glute sets, and heel slides. SLR required min assist for lifting     General Comments General comments (skin integrity, edema, etc.): Pt had bowel movement during PT mobility. Pt was assisted with sitting and adjustments to his positioning. P      Pertinent Vitals/Pain Pain Assessment: No/denies pain    Home Living                      Prior Function            PT Goals (current goals can now be found in the care plan section) Acute Rehab PT Goals Patient Stated Goal: i'm open to going to therapy PT Goal Formulation: With patient Time For Goal Achievement: 07/17/15 Potential to Achieve Goals: Fair    Frequency  Min 2X/week    PT Plan Current plan remains appropriate    Co-evaluation  End of Session Equipment Utilized During Treatment: Gait belt Activity Tolerance: Patient limited by fatigue Patient left: in bed;with call bell/phone within reach;with bed alarm set;with nursing/sitter in room     Time: 6484-7207 PT Time Calculation (min) (ACUTE ONLY): 25 min  Charges:                       G CodesJanyth Contes 2015-07-21, 4:59 PM [name], [name]

## 2015-07-04 NOTE — Progress Notes (Signed)
Clinical Education officer, museum (CSW) presented bed offers to patient. He chose [facility]. [employer] authorization has been received. Auth # [id number]. [employer] authorization is good for 5 days starting tomorrow. If patient does not D/C by Monday 07/09/15 then a new a [employer] authorization will have to be received. [employer] Case Manager is Banker: [phone].

## 2015-07-05 ENCOUNTER — Encounter
Admission: EM | Disposition: A | Discharge status: Skilled Nursing Facility | Payer: Self-pay | Source: Home / Self Care | Attending: Internal Medicine

## 2015-07-05 ENCOUNTER — Inpatient Hospital Stay: Payer: 59 | Admitting: Anesthesiology

## 2015-07-05 ENCOUNTER — Encounter: Payer: Self-pay | Admitting: *Deleted

## 2015-07-05 HISTORY — PX: COLONOSCOPY WITH PROPOFOL: SHX5780

## 2015-07-05 HISTORY — PX: ESOPHAGOGASTRODUODENOSCOPY (EGD) WITH PROPOFOL: SHX5813

## 2015-07-05 LAB — URINALYSIS COMPLETE WITH MICROSCOPIC (ARMC ONLY)
BILIRUBIN URINE: NEGATIVE
Bacteria, UA: NONE SEEN
GLUCOSE, UA: NEGATIVE mg/dL
HGB URINE DIPSTICK: NEGATIVE
KETONES UR: NEGATIVE mg/dL
Leukocytes, UA: NEGATIVE
NITRITE: NEGATIVE
PH: 5 (ref 5.0–8.0)
Protein, ur: NEGATIVE mg/dL
Specific Gravity, Urine: 1.005 (ref 1.005–1.030)

## 2015-07-05 LAB — CBC
HCT: 25.3 % — ABNORMAL LOW (ref 40.0–52.0)
HEMOGLOBIN: 8.3 g/dL — AB (ref 13.0–18.0)
MCH: 31.9 pg (ref 26.0–34.0)
MCHC: 32.6 g/dL (ref 32.0–36.0)
MCV: 97.7 fL (ref 80.0–100.0)
Platelets: 47 10*3/uL — ABNORMAL LOW (ref 150–440)
RBC: 2.59 MIL/uL — AB (ref 4.40–5.90)
RDW: 14.8 % — ABNORMAL HIGH (ref 11.5–14.5)
WBC: 4.9 10*3/uL (ref 3.8–10.6)

## 2015-07-05 LAB — VANCOMYCIN, RANDOM: Vancomycin Rm: 21 ug/mL

## 2015-07-05 LAB — COMPREHENSIVE METABOLIC PANEL
ALK PHOS: 110 U/L (ref 38–126)
ALT: 12 U/L — AB (ref 17–63)
ANION GAP: 7 (ref 5–15)
AST: 34 U/L (ref 15–41)
Albumin: 2 g/dL — ABNORMAL LOW (ref 3.5–5.0)
BUN: 11 mg/dL (ref 6–20)
CALCIUM: 7.8 mg/dL — AB (ref 8.9–10.3)
CHLORIDE: 113 mmol/L — AB (ref 101–111)
CO2: 19 mmol/L — AB (ref 22–32)
Creatinine, Ser: 2.56 mg/dL — ABNORMAL HIGH (ref 0.61–1.24)
GFR calc Af Amer: 33 mL/min — ABNORMAL LOW (ref >60)
GFR calc non Af Amer: 29 mL/min — ABNORMAL LOW (ref >60)
GLUCOSE: 87 mg/dL (ref 65–99)
Potassium: 4.6 mmol/L (ref 3.5–5.1)
SODIUM: 139 mmol/L (ref 135–145)
Total Bilirubin: 0.7 mg/dL (ref 0.3–1.2)
Total Protein: 4.8 g/dL — ABNORMAL LOW (ref 6.5–8.1)

## 2015-07-05 LAB — TSH: TSH: 8.008 u[IU]/mL — ABNORMAL HIGH (ref 0.350–4.500)

## 2015-07-05 SURGERY — ESOPHAGOGASTRODUODENOSCOPY (EGD) WITH PROPOFOL
Anesthesia: General

## 2015-07-05 MED ORDER — LIDOCAINE HCL (CARDIAC) 20 MG/ML IV SOLN
INTRAVENOUS | Status: DC | PRN
  Administered 2015-07-05: 30 mg via INTRAVENOUS

## 2015-07-05 MED ORDER — SODIUM CHLORIDE 0.9 % IV SOLN: INTRAVENOUS | Status: DC

## 2015-07-05 MED ORDER — MIDAZOLAM HCL 5 MG/5ML IJ SOLN
INTRAMUSCULAR | Status: DC | PRN
  Administered 2015-07-05: 1 mg via INTRAVENOUS

## 2015-07-05 MED ORDER — PHENYLEPHRINE HCL 10 MG/ML IJ SOLN
INTRAMUSCULAR | Status: DC | PRN
  Administered 2015-07-05 (×4): .1 ug via INTRAVENOUS

## 2015-07-05 MED ORDER — HYDROCODONE-ACETAMINOPHEN 5-325 MG PO TABS
1.0000 | ORAL_TABLET | ORAL | Status: DC | PRN
  Administered 2015-07-05 – 2015-07-09 (×20): 1 via ORAL
  Filled 2015-07-05 (×20): qty 1

## 2015-07-05 MED ORDER — PROPOFOL 10 MG/ML IV BOLUS
INTRAVENOUS | Status: DC | PRN
  Administered 2015-07-05: 50 mg via INTRAVENOUS

## 2015-07-05 MED ORDER — VANCOMYCIN HCL IN DEXTROSE 1-5 GM/200ML-% IV SOLN
1000.0000 mg | Freq: Once | INTRAVENOUS | Status: AC
  Administered 2015-07-05: 1000 mg via INTRAVENOUS
  Filled 2015-07-05: qty 200

## 2015-07-05 MED ORDER — FENTANYL CITRATE (PF) 100 MCG/2ML IJ SOLN
INTRAMUSCULAR | Status: DC | PRN
  Administered 2015-07-05: 50 ug via INTRAVENOUS

## 2015-07-05 MED ORDER — EPHEDRINE SULFATE 50 MG/ML IJ SOLN
INTRAMUSCULAR | Status: DC | PRN
  Administered 2015-07-05 (×2): 10 mg via INTRAVENOUS

## 2015-07-05 MED ORDER — ACETAMINOPHEN 325 MG PO TABS: 650.0000 mg | ORAL_TABLET | Freq: Four times a day (QID) | ORAL | Status: DC | PRN

## 2015-07-05 MED ORDER — METHYLPREDNISOLONE SODIUM SUCC 40 MG IJ SOLR
20.0000 mg | Freq: Three times a day (TID) | INTRAMUSCULAR | Status: DC
  Administered 2015-07-05 – 2015-07-07 (×6): 20 mg via INTRAVENOUS
  Filled 2015-07-05 (×7): qty 1

## 2015-07-05 MED ORDER — PROPOFOL INFUSION 10 MG/ML OPTIME
INTRAVENOUS | Status: DC | PRN
  Administered 2015-07-05: 140 ug/kg/min via INTRAVENOUS

## 2015-07-05 MED ORDER — SODIUM CHLORIDE 0.9 % IV SOLN
INTRAVENOUS | Status: DC
  Administered 2015-07-05 (×2): via INTRAVENOUS

## 2015-07-05 NOTE — OR Nursing (Signed)
Stool aspirate sent to lab for Ova and Parasite, stool culture.

## 2015-07-05 NOTE — Progress Notes (Signed)
[place] at [place] NAME: Troy [name]    MR#:  [id number]  DATE OF BIRTH:  09-Nov-1971  SUBJECTIVE:  CHIEF COMPLAINT:   Chief Complaint  Patient presents with  . Fall   Continued uncontrolled diarrhea and lower abdominal cramping. BP low this am in the 90/60's. Has not been working with PT. Renal function worsening despite fluids.  Plan for colonoscopy this PM. He is hungry and wants to eat.  REVIEW OF SYSTEMS:   Review of Systems  Constitutional: Negative for fever.  Respiratory: Negative for shortness of breath.   Cardiovascular: Negative for chest pain and palpitations.  Gastrointestinal: Positive for abdominal pain and diarrhea. Negative for nausea, vomiting and blood in stool.  Genitourinary: Negative for dysuria.    DRUG ALLERGIES:  No Known Allergies  VITALS:  Blood pressure 100/58, pulse 86, temperature 99.1 F (37.3 C), temperature source Oral, resp. rate 17, height 5' 8"  (1.727 m), weight 92.035 kg (202 lb 14.4 oz), SpO2 100 %.  PHYSICAL EXAMINATION:  GENERAL:  44 y.o.-year-old patient lying in the bed with no acute distress. weak EYES: Pupils equal, round, reactive to light and accommodation. No scleral icterus. Extraocular muscles intact.  HEENT: Head atraumatic, normocephalic. Oropharynx and nasopharynx clear.  Mouth dry NECK:  Supple, no jugular venous distention. No thyroid enlargement, no tenderness.  LUNGS: Normal breath sounds bilaterally, no wheezing, rales, rhonchi or crepitation. No use of accessory muscles of respiration.  CARDIOVASCULAR: S1, S2 normal. No murmurs, rubs, or gallops.  ABDOMEN: Soft, ttp in BLQ and in periumbilical area, nondistended. Bowel sounds present. No organomegaly or mass.  EXTREMITIES: No pedal edema, cyanosis, or clubbing.  NEUROLOGIC: Cranial nerves II through XII are intact. Muscle strength 5/5 in all extremities. Sensation intact.' PSYCHIATRIC: The patient is alert and oriented  x 3.  SKIN: No obvious rash, lesion, or ulcer.    LABORATORY PANEL:   CBC  Recent Labs Lab 07/05/15 0625  WBC 4.9  HGB 8.3*  HCT 25.3*  PLT 47*   ------------------------------------------------------------------------------------------------------------------  Chemistries   Recent Labs Lab 07/04/15 0609 07/05/15 0625  NA 139 139  K 4.1 4.6  CL 108 113*  CO2 21* 19*  GLUCOSE 102* 87  BUN 9 11  CREATININE 2.05* 2.56*  CALCIUM 7.6* 7.8*  MG 1.5*  --   AST  --  34  ALT  --  12*  ALKPHOS  --  110  BILITOT  --  0.7   ------------------------------------------------------------------------------------------------------------------  Cardiac Enzymes  Recent Labs Lab 07/01/15 2103  TROPONINI 0.03   ------------------------------------------------------------------------------------------------------------------  RADIOLOGY:  Ct Abdomen Pelvis W Contrast  07/03/2015   CLINICAL DATA:  Acute onset of hypotension, tachycardia and generalized weakness. Leukocytosis and hypokalemia. History of C difficile colitis. Initial encounter.  EXAM: CT ABDOMEN AND PELVIS WITH CONTRAST  TECHNIQUE: Multidetector CT imaging of the abdomen and pelvis was performed using the standard protocol following bolus administration of intravenous contrast.  CONTRAST:  121m OMNIPAQUE IOHEXOL 300 MG/ML  SOLN  COMPARISON:  CT of the abdomen and pelvis from 02/22/2014  FINDINGS: The visualized lung bases are clear.  Scattered varices are noted about the spleen and stomach, reflecting the patient's known hepatic cirrhosis. The spleen is enlarged, measuring 17.0 cm in length. The pancreas, gallbladder and adrenal glands are within normal limits.  Nonspecific perinephric stranding is noted bilaterally. The kidneys are otherwise grossly unremarkable. There is no evidence of hydronephrosis. No renal or ureteral stones are seen.  The small  bowel is unremarkable in appearance. The stomach is within normal limits.  No acute vascular abnormalities are seen. Mild scattered calcification is noted along the abdominal aorta.  The appendix is not well characterized.  There is diffuse wall thickening involving much of the colon, with mild sparing of the proximal sigmoid colon, compatible with the patient's known C difficile colitis. Mild surrounding soft tissue inflammation and trace free fluid are seen.  The bladder is mildly distended. Mild bladder wall thickening could reflect mild cystitis. The prostate remains normal in size. No inguinal lymphadenopathy is seen.  No acute osseous abnormalities are identified. The patient's left hip arthroplasty is grossly unremarkable in appearance, though incompletely imaged. There is partial collapse of the right femoral head, with underlying sclerosis. A likely chronic compression deformity of vertebral body of T12 is noted, with mild overlying degenerative change at T11-T12.  IMPRESSION: 1. Diffuse wall thickening involving much of the colon, with mild sparing of the proximal sigmoid colon, compatible with the patient's known C difficile colitis. Mild surrounding soft tissue inflammation and trace free fluid seen. 2. Mild bladder wall thickening could reflect mild cystitis. Would correlate for associated symptoms. 3. Splenomegaly noted. 4. Scattered splenic and gastric varices noted, reflecting the patient's known hepatic cirrhosis. 5. Mild scattered calcification along the abdominal aorta. 6. Partial collapse of the right femoral head, with underlying sclerotic change. 7. Likely chronic compression deformity of vertebral body T12, with mild associated degenerative change.   Electronically Signed   By: Garald Balding M.D.   On: 07/03/2015 21:46    EKG:   Orders placed or performed during the hospital encounter of 07/01/15  . ED EKG  . ED EKG  . EKG 12-Lead  . EKG 12-Lead    ASSESSMENT AND PLAN:   1. Sepsis: The patient met septic criteria on admission with tachycardi  leukocytosis and diarrhea. 2 most recent stool studies negative for c. Dif, ova and parasites, culture negative. Blood cultures negative. Now with low grade fever at 99. ContinueVancomycin, flagyl and Zosyn.  2. Severe Weakness: Likely secondary to sepsis, dehydration and hypokalemia.Continue to replace electrolytes. Physical therapy. Check cortisol. . 3. Diarrhea: The patient has had chronic loose stool for more than a month. At one point he was being treated for C. difficile colitis but on his most recent hospitalization C. difficile assay was negative. The patient was given Dificid during previous hospitalization and he stopped using it after d/c as [place] didn't have dificid , was not started by admitting physician as no indication of C. difficile. Enterotoxin assay ordered in the emergency department. on probiotics. Colonoscopy planned for tomorrow by Dr. Rayann Heman. Question pseudomembranous colitis.  4. Alcohol withdrawal: Likely contributing to diarrhea as well. Denies heavy ETOH abuse.on a CIWA scale. No recent ativan needed.  5. Hip pain: sec to Avascular necrosis. We will manage pain with pain meds. Patient is status post left hip replacement and dr.Menz is recommending OP RHA  6. Acute renal failure: No improvement with fluids. Recheck UA. Possibly due to hypotension.  7. DVT prophylaxis: Heparin 8. GI prophylaxis: None  9. Hypotension: dc IV morphine, continue IV fluids, check cortisol and recheck TSH, recheck blood cultures.  CODE STATUS: Full  TOTAL TIME TAKING CARE OF THIS PATIENT: 45 minutes.  Greater than 50% of time spent in care coordination and counseling.All the records are reviewed and case discussed with Care Management/Social Worker. Management plans discussed with the patient and nurse.  POSSIBLE D/C IN 2-3 DAYS, DEPENDING ON CLINICAL  CONDITION.   Myrtis Ser M.D on 07/05/2015 at 1:04 PM  Between 7am to 6pm - Pager - [phone]  After 6pm go to  [email or web address] - password [email or web address]  [place] [facility]  Office  [phone]  CC: Primary care physician; No PCP Per Patient

## 2015-07-05 NOTE — Progress Notes (Signed)
GI Inpatient Follow-up Note  Patient Identification: Troy [name] is a 44 y.o. male with ETOH cirrhosis,  Chronic diarrhea  Subjective:  + still reports mult stool per hour.  NO bleeding, no abd pain, no f/c.   Scheduled Inpatient Medications:  . [MAR Hold] folic acid  1 mg Oral Daily  . [MAR Hold] loperamide  2 mg Oral TID  . [MAR Hold] magnesium oxide  200 mg Oral BID  . [MAR Hold] magnesium sulfate 1 - 4 g bolus IVPB  1 g Intravenous Once  . [MAR Hold] nystatin   Topical TID  . [MAR Hold] pantoprazole  40 mg Oral Daily  . [MAR Hold] piperacillin-tazobactam (ZOSYN)  IV  3.375 g Intravenous Q12H  . [MAR Hold] saccharomyces boulardii  250 mg Oral BID  . [MAR Hold] sodium chloride  3 mL Intravenous Q12H  . [MAR Hold] thiamine  100 mg Oral Daily  . [MAR Hold] vancomycin  1,000 mg Intravenous Once    Continuous Inpatient Infusions:   . sodium chloride    . sodium chloride 10 mL/hr at 07/05/15 1454  . 0.9 % NaCl with KCl 40 mEq / L 125 mL/hr (07/05/15 0944)    PRN Inpatient Medications:  [MAR Hold] acetaminophen, [MAR Hold] HYDROcodone-acetaminophen, [MAR Hold] liver oil-zinc oxide, [MAR Hold] ondansetron **OR** [MAR Hold] ondansetron (ZOFRAN) IV, [MAR Hold] pregabalin, [MAR Hold] QUEtiapine   Physical Examination: BP 100/86 mmHg  Pulse 74  Temp(Src) 98.6 F (37 C) (Tympanic)  Resp 16  Ht 5\' 8"  (1.727 m)  Wt 92.035 kg (202 lb 14.4 oz)  BMI 30.86 kg/m2  SpO2 100% Gen: NAD, alert and oriented x 4 Neck: supple, no JVD or thyromegaly Chest: CTA bilaterally, no wheezes, crackles, or other adventitious sounds CV: RRR, no m/g/c/r Abd: obese, nabs, soft Ext: no edema, well perfused with 2+ pulses,  Data: Lab Results  Component Value Date   WBC 4.9 07/05/2015   HGB 8.3* 07/05/2015   HCT 25.3* 07/05/2015   MCV 97.7 07/05/2015   PLT 47* 07/05/2015    Recent Labs Lab 07/01/15 2103 07/03/15 1005 07/05/15 0625  HGB 10.7* 8.4* 8.3*   Lab Results  Component Value Date    NA 139 07/05/2015   K 4.6 07/05/2015   CL 113* 07/05/2015   CO2 19* 07/05/2015   BUN 11 07/05/2015   CREATININE 2.56* 07/05/2015   Lab Results  Component Value Date   ALT 12* 07/05/2015   AST 34 07/05/2015   ALKPHOS 110 07/05/2015   BILITOT 0.7 07/05/2015   No results for input(s): APTT, INR, PTT in the last 168 hours.   Assessment/Plan: Troy [name] is a 44 y.o. male with chronic diarrhea.   Colonoscopoy today shows diffuse colonic inflammation,mild. Likely inflammatory bowel disease.  EGD shows grade 2 varices, healing gastric ulcer.   Recommendations: - sending stool for culture and o and p - start methylprednisolone 20 mg q8 hr - await pathology  Please call with questions or concerns.  Troy [name], Troy Blight, MD

## 2015-07-05 NOTE — Progress Notes (Signed)
Patient's blood pressure 100/58, MD paged, call back received from [facility]- informed MD Nadolol not given this am due to blood pressure 100/58.  Will continue to monitor the patient.

## 2015-07-05 NOTE — Anesthesia Postprocedure Evaluation (Signed)
  Anesthesia Post-op Note  Patient: Troy [name]  Procedure(s) Performed: Procedure(s): ESOPHAGOGASTRODUODENOSCOPY (EGD) WITH PROPOFOL (N/A) COLONOSCOPY WITH PROPOFOL (N/A)  Anesthesia type:General  Patient location: PACU  Post pain: Pain level controlled  Post assessment: Post-op Vital signs reviewed, Patient's Cardiovascular Status Stable, Respiratory Function Stable, Patent Airway and No signs of Nausea or vomiting  Post vital signs: Reviewed and stable  Last Vitals:  Filed Vitals:   07/05/15 1622  BP: 100/86  Pulse: 74  Temp: 37 C  Resp: 16    Level of consciousness: awake, alert  and patient cooperative  Complications: No apparent anesthesia complications

## 2015-07-05 NOTE — Op Note (Signed)
[facility] Patient Name: Troy [name] Procedure Date: 07/05/2015 3:15 PM MRN: [id number] Account #: [id number] Date of Birth: May 01, 1971 Admit Type: Inpatient Age: 44 Room: [facility] ENDO ROOM 4 Gender: Male Note Status: Finalized Procedure:         Upper GI endoscopy Indications:       Anemia, Follow-up of esophageal varices, Diarrhea Patient Profile:   This is a 44 year old male. Providers:         Gerrit Heck. Rayann Heman, MD Medicines:         Propofol per Anesthesia Complications:     No immediate complications. Procedure:         Pre-Anesthesia Assessment:                    - Prior to the procedure, a History and Physical was                     performed, and patient medications, allergies and                     sensitivities were reviewed. The patient's tolerance of                     previous anesthesia was reviewed.                    After obtaining informed consent, the endoscope was passed                     under direct vision. Throughout the procedure, the                     patient's blood pressure, pulse, and oxygen saturations                     were monitored continuously. The Olympus GIF-160 endoscope                     (S#. [id number]) was introduced through the mouth, and                     advanced to the second part of duodenum. The upper GI                     endoscopy was accomplished without difficulty. The patient                     tolerated the procedure. Findings:      Grade II varices were found in the lower third of the esophagus.      One non-bleeding healing gastric ulcer was found in the gastric antrum.       The lesion was 10 mm in largest dimension. Biopsies were taken with a       cold forceps for histology.      The examined duodenum was normal.      Three biopsies were obtained with cold forceps for histology randomly in       the duodenal bulb and in the 2nd part of the duodenum. Impression:        - Grade II  esophageal varices.                    - Gastric ulcer which appeared to be healing. Biopsied.                    -  Normal examined duodenum.                    - Three biopsies were obtained in the duodenal bulb and in                     the 2nd part of the duodenum. Recommendation:    - Perform a colonoscopy today.                    - Avoid nsaids                    - f/u pathology                    - treat H P if postiive                    - consider EUS of antral ulcer                    - start nadolol once diarhea and renal failure are                     resolved for grade 2 varices.                    - The findings and recommendations were discussed with the                     patient. Procedure Code(s): --- Professional ---                    [phone], Esophagogastroduodenoscopy, flexible, transoral;                     with biopsy, single or multiple CPT copyright 2014 American [employer]. All rights reserved. The codes documented in this report are preliminary and upon coder review may  be revised to meet current compliance requirements. Mellody Life, MD 07/05/2015 3:35:02 PM This report has been signed electronically. Number of Addenda: 0 Note Initiated On: 07/05/2015 3:15 PM      [facility]

## 2015-07-05 NOTE — H&P (View-Only) (Signed)
GI Inpatient Follow-up Note  Patient Identification: Troy [name] is a 44 y.o. male chhroic diarrhea, cirrhosis  Subjective:  Still having mult stools per hour.  Denies blood in stools.  MIld abd pain, no f/c.   Scheduled Inpatient Medications:  . folic acid  1 mg Oral Daily  . loperamide  2 mg Oral TID  . magnesium oxide  200 mg Oral BID  . magnesium sulfate 1 - 4 g bolus IVPB  1 g Intravenous Once  . metronidazole  500 mg Intravenous Q8H  . nadolol  40 mg Oral Daily  . nystatin   Topical TID  . pantoprazole  40 mg Oral Daily  . piperacillin-tazobactam (ZOSYN)  IV  3.375 g Intravenous Q12H  . polyethylene glycol powder  1 Container Oral Once  . potassium chloride SA  40 mEq Oral BID  . saccharomyces boulardii  250 mg Oral BID  . sodium chloride  3 mL Intravenous Q12H  . thiamine  100 mg Oral Daily  . [START ON 07/05/2015] vancomycin  1,000 mg Intravenous Q48H    Continuous Inpatient Infusions:   . 0.9 % NaCl with KCl 40 mEq / L 125 mL/hr (07/04/15 1342)    PRN Inpatient Medications:  liver oil-zinc oxide, morphine injection, ondansetron **OR** ondansetron (ZOFRAN) IV, pregabalin, QUEtiapine   Physical Examination: BP 93/56 mmHg  Pulse 82  Temp(Src) 97.8 F (36.6 C) (Oral)  Resp 16  Ht 5\' 8"  (1.727 m)  Wt 92.035 kg (202 lb 14.4 oz)  BMI 30.86 kg/m2  SpO2 100% Gen: NAD, alert and oriented x 4 HEENT: PEERLA, EOMI, Neck: supple, no JVD or thyromegaly Chest: CTA bilaterally, no wheezes, crackles, or other adventitious sounds CV: RRR, no m/g/c/r Abd: +obese abd, mild diffuse TTP, nabs, no r/g.  Ext: no edema, well perfused with 2+ pulses, Skin: no rash or lesions noted Lymph: no LAD  Data: Lab Results  Component Value Date   WBC 6.7 07/03/2015   HGB 8.4* 07/03/2015   HCT 25.6* 07/03/2015   MCV 96.2 07/03/2015   PLT 50* 07/03/2015    Recent Labs Lab 07/01/15 2103 07/03/15 1005  HGB 10.7* 8.4*   Lab Results  Component Value Date   NA 139 07/04/2015   K 4.1 07/04/2015   CL 108 07/04/2015   CO2 21* 07/04/2015   BUN 9 07/04/2015   CREATININE 2.05* 07/04/2015   Lab Results  Component Value Date   ALT 15* 07/01/2015   AST 60* 07/01/2015   ALKPHOS 189* 07/01/2015   BILITOT 2.8* 07/01/2015   No results for input(s): APTT, INR, PTT in the last 168 hours.   Assessment/Plan: Mr. Dollar is a 44 y.o. male with chronic diarhea.  C.diff neg but Ct shows diffuse colonic thickening ? UC or Crohn's.   Recommendations: - colonoscopy tomorrow - EGD at same time for anemia, dark stools, hx of large antral lesion,.  - mirala x prep tonight - npo after mn.   Please call with questions or concerns.  Clela Hagadorn, Grace Blight, MD

## 2015-07-05 NOTE — Progress Notes (Signed)
Pt has been seen by PT this date and is improving. He is ambulating greater distances and requires less dependence during bed mobility and transfers. He continues to need skilled PT in order to address his general weakness deconditioning for eventual safe return home.   Due to [facility] complications during documentation, full note will be entered into the medical record tomorrow AM at resolve of technical problem.   [employer], SPT. [phone]

## 2015-07-05 NOTE — Progress Notes (Signed)
ANTIBIOTIC CONSULT NOTE - FOLLOW UP  Pharmacy Consult for Vancomycin and Zosyn  Indication: Sepsis  No Known Allergies  Patient Measurements: Height: 5\' 8"  (172.7 cm) Weight: 202 lb 14.4 oz (92.035 kg) IBW/kg (Calculated) : 68.4   Vital Signs: Temp: 98.1 F (36.7 C) (07/21 1145) Temp Source: Oral (07/21 1145) BP: 100/64 mmHg (07/21 1145) Pulse Rate: 78 (07/21 1145) Intake/Output from previous day: 07/20 0701 - 07/21 0700 In: 2883 [P.O.:2880; I.V.:3] Out: 1825 [Urine:1825] Intake/Output from this shift: Total I/O In: -  Out: 900 [Urine:900]  Labs:  Recent Labs  07/03/15 0736 07/03/15 1005 07/04/15 0609 07/05/15 0625  WBC  --  6.7  --  4.9  HGB  --  8.4*  --  8.3*  PLT  --  50*  --  47*  CREATININE 0.91  --  2.05* 2.56*   Estimated Creatinine Clearance: 40.5 mL/min (by C-G formula based on Cr of 2.56).  Recent Labs  07/03/15 0736 07/03/15 1801 07/04/15 1242 07/05/15 1206  VANCOTROUGH 41* 70*  --   --   VANCORANDOM  --   --  25 21     Microbiology: Recent Results (from the past 720 hour(s))  Blood Culture (routine x 2)     Status: None   Collection Time: 06/06/15  4:14 PM  Result Value Ref Range Status   Specimen Description BLOOD  Final   Special Requests NONE  Final   Culture NO GROWTH 5 DAYS  Final   Report Status 06/11/2015 FINAL  Final  Stool culture     Status: None   Collection Time: 06/06/15  4:14 PM  Result Value Ref Range Status   Specimen Description STOOL  Final   Special Requests Normal  Final   Culture   Final    NO SALMONELLA OR SHIGELLA ISOLATED No Pathogenic E. coli detected    Report Status 06/10/2015 FINAL  Final  C difficile quick scan w PCR reflex ([facility] only)     Status: None   Collection Time: 06/06/15  4:20 PM  Result Value Ref Range Status   C Diff antigen POSITIVE  Final   C Diff toxin NEGATIVE  Final   C Diff interpretation   Final    Positive for toxigenic C. difficile, active toxin production not detected. Patient  has toxigenic C. difficile organisms present in the bowel, but toxin was not detected. The patient may be a carrier or the level of toxin in the sample was below the limit  of detection. This information should be used in conjunction with the patient's clinical history when deciding on possible therapy.     Comment: CRITICAL RESULT CALLED TO, READ BACK BY AND VERIFIED WITH: LISA THOMPSON 06/06/15 AT 2033.SDR   Blood Culture (routine x 2)     Status: None   Collection Time: 06/06/15  4:20 PM  Result Value Ref Range Status   Specimen Description BLOOD  Final   Special Requests NONE  Final   Culture  Setup Time   Final    GRAM POSITIVE RODS ANAEROBIC BOTTLE ONLY CRITICAL RESULT CALLED TO, READ BACK BY AND VERIFIED WITH: BRITNEY KILLINGSWORTH AT 8657 06/07/15 DV    Culture   Final    CLOSTRIDIUM PARAPUTRIFICUM ANAEROBIC BOTTLE ONLY COAGULASE NEGATIVE STAPHYLOCOCCUS AEROBIC BOTTLE ONLY POSSIBLE CONTAMINATION WITH SKIN FLORA     Report Status 06/12/2015 FINAL  Final   Organism ID, Bacteria CLOSTRIDIUM PARAPUTRIFICUM  Final  Clostridium Difficile by PCR (not at [facility])     Status:  Abnormal   Collection Time: 06/06/15  4:20 PM  Result Value Ref Range Status   C difficile by pcr POSITIVE (A) NEGATIVE Final    Comment: Positive for toxigenic C. difficile, active toxin production not detected. Patient has toxigenic C. difficile organisms present in the bowel, but toxin was not detected. The patient may be a carrier or the level of toxin in the sample was below the limit  of detection. This information should be used in conjunction with the patient's clinical history when deciding on possible therapy. CRITICAL RESULT CALLED TO, READ BACK BY AND VERIFIED WITH: LISA THOMPSON 06/06/15 2030. SDW   Giardia, EIA; Ova/Parasite     Status: None   Collection Time: 06/06/15  5:09 PM  Result Value Ref Range Status   Ova + Parasite Exam Final report  Final    Comment: (NOTE) These results were obtained  using wet preparation(s) and trichrome stained smear. This test does not include testing for Cryptosporidium parvum, Cyclospora, or Microsporidia.    Giardia Ag, Stl Negative Negative Final    Comment: (NOTE) Performed At: [place], [place] [id number] Caprice Red MD YP:[id number] Performed At: [facility] [place], [place] [id number] Lindon Romp MD AS:[id number]    Source of Sample STOOL  Corrected  MRSA PCR Screening     Status: None   Collection Time: 06/06/15  9:00 PM  Result Value Ref Range Status   MRSA by PCR NEGATIVE NEGATIVE Final    Comment:        The GeneXpert MRSA Assay (FDA approved for NASAL specimens only), is one component of a comprehensive MRSA colonization surveillance program. It is not intended to diagnose MRSA infection nor to guide or monitor treatment for MRSA infections.   Urine culture     Status: None   Collection Time: 06/07/15  3:41 PM  Result Value Ref Range Status   Specimen Description URINE, CLEAN CATCH  Final   Special Requests NONE  Final   Culture NO GROWTH 2 DAYS  Final   Report Status 06/09/2015 FINAL  Final  C difficile quick scan w PCR reflex ([facility] only)     Status: None   Collection Time: 06/12/15  4:21 PM  Result Value Ref Range Status   C Diff antigen NEGATIVE  Final   C Diff toxin NEGATIVE  Final   C Diff interpretation Negative for C. difficile  Final  C difficile quick scan w PCR reflex ([facility] only)     Status: None   Collection Time: 06/18/15 10:30 PM  Result Value Ref Range Status   C Diff antigen NEGATIVE  Final   C Diff toxin NEGATIVE  Final   C Diff interpretation Negative for C. difficile  Final  Culture, blood (routine x 2)     Status: None (Preliminary result)   Collection Time: 07/01/15 10:24 PM  Result Value Ref Range Status   Specimen Description BLOOD LEFT ARM  Final   Special Requests BOTTLES DRAWN AEROBIC AND ANAEROBIC 10CC  Final   Culture  NO GROWTH 2 DAYS  Final   Report Status PENDING  Incomplete  Culture, blood (routine x 2)     Status: None (Preliminary result)   Collection Time: 07/01/15 10:30 PM  Result Value Ref Range Status   Specimen Description BLOOD RIGHT ARM  Final   Special Requests BOTTLES DRAWN AEROBIC AND ANAEROBIC 10CC  Final   Culture NO GROWTH 2 DAYS  Final  Report Status PENDING  Incomplete  C difficile quick scan w PCR reflex ([facility] only)     Status: None   Collection Time: 07/03/15  8:30 AM  Result Value Ref Range Status   C Diff antigen NEGATIVE  Final   C Diff toxin NEGATIVE  Final   C Diff interpretation Negative for C. difficile  Final    Anti-infectives    Start     Dose/Rate Route Frequency Ordered Stop   07/05/15 1800  vancomycin (VANCOCIN) IVPB 1000 mg/200 mL premix     1,000 mg 200 mL/hr over 60 Minutes Intravenous  Once 07/05/15 1346     07/05/15 1200  vancomycin (VANCOCIN) IVPB 1000 mg/200 mL premix  Status:  Discontinued     1,000 mg 200 mL/hr over 60 Minutes Intravenous Every 48 hours 07/04/15 1451 07/05/15 0818   07/05/15 0600  vancomycin (VANCOCIN) IVPB 1000 mg/200 mL premix  Status:  Discontinued     1,000 mg 200 mL/hr over 60 Minutes Intravenous Every 36 hours 07/03/15 1900 07/04/15 1005   07/04/15 1830  piperacillin-tazobactam (ZOSYN) IVPB 3.375 g     3.375 g 12.5 mL/hr over 240 Minutes Intravenous Every 12 hours 07/04/15 0957     07/03/15 2000  vancomycin (VANCOCIN) 1,250 mg in sodium chloride 0.9 % 250 mL IVPB  Status:  Discontinued     1,250 mg 166.7 mL/hr over 90 Minutes Intravenous Every 18 hours 07/03/15 0911 07/03/15 1849   07/02/15 1900  metroNIDAZOLE (FLAGYL) IVPB 500 mg  Status:  Discontinued     500 mg 100 mL/hr over 60 Minutes Intravenous Every 8 hours 07/02/15 1851 07/05/15 1339   07/02/15 0900  piperacillin-tazobactam (ZOSYN) IVPB 3.375 g  Status:  Discontinued     3.375 g 12.5 mL/hr over 240 Minutes Intravenous 3 times per day 07/02/15 0245 07/04/15 0957    07/02/15 0800  vancomycin (VANCOCIN) 1,250 mg in sodium chloride 0.9 % 250 mL IVPB  Status:  Discontinued     1,250 mg 166.7 mL/hr over 90 Minutes Intravenous Every 8 hours 07/02/15 0226 07/03/15 0908   07/02/15 0045  vancomycin (VANCOCIN) 1,250 mg in sodium chloride 0.9 % 250 mL IVPB     1,250 mg 166.7 mL/hr over 90 Minutes Intravenous  Once 07/02/15 0037 07/02/15 0336   07/02/15 0045  piperacillin-tazobactam (ZOSYN) IVPB 3.375 g     3.375 g 100 mL/hr over 30 Minutes Intravenous  Once 07/02/15 0037 07/02/15 0306      Assessment: Patient previously on vancomycin 1250 mg iv q 8 h with elevated trough of 41 mcg/ml. Of note, patient is on Lasix. Subsequent trough resulted at 34 mcg/ml with calculated ke of 0.0178.  Scr: up to 2.0 mg/dl. Likely cause of the high Vancomycin level. Random level ordered to be drawn @ 1200.  07/05/15 vancomycin trough this afternoon was 21 mcg/mL.   Goal of Therapy:  Vancomycin trough level 15-20 mcg/ml  Plan:  Will redose vancomycin 1 gm IV x 1, follow serum creatinine, check trough in 48 hours, and redose if necessary. Dosing by levels while AKI.    Laural Benes 07/05/2015,1:46 PM

## 2015-07-05 NOTE — Transfer of Care (Signed)
Immediate Anesthesia Transfer of Care Note  Patient: NILTON LAVE  Procedure(s) Performed: Procedure(s): ESOPHAGOGASTRODUODENOSCOPY (EGD) WITH PROPOFOL (N/A) COLONOSCOPY WITH PROPOFOL (N/A)  Patient Location: PACU and Short Stay  Anesthesia Type:General  Level of Consciousness: awake, alert , oriented and patient cooperative  Airway & Oxygen Therapy: Patient Spontanous Breathing and Patient connected to nasal cannula oxygen  Post-op Assessment: Report given to RN and Post -op Vital signs reviewed and stable  Post vital signs: Reviewed  Last Vitals:  Filed Vitals:   07/05/15 1457  BP: 105/62  Pulse: 75  Temp: 36.6 C  Resp: 14    Complications: No apparent anesthesia complications

## 2015-07-05 NOTE — Transfer of Care (Signed)
Immediate Anesthesia Transfer of Care Note  Patient: Troy [name]  Procedure(s) Performed: Procedure(s): ESOPHAGOGASTRODUODENOSCOPY (EGD) WITH PROPOFOL (N/A) COLONOSCOPY WITH PROPOFOL (N/A)  Patient Location: PACU and Short Stay  Anesthesia Type:General  Level of Consciousness: awake and oriented  Airway & Oxygen Therapy: Patient Spontanous Breathing and Patient connected to nasal cannula oxygen  Post-op Assessment: Report given to RN and Post -op Vital signs reviewed and stable  Post vital signs: Reviewed and stable  Last Vitals:  Filed Vitals:   07/05/15 1622  BP: 100/86  Pulse: 74  Temp: 37 C  Resp: 16    Complications: No apparent anesthesia complications

## 2015-07-05 NOTE — Op Note (Signed)
[facility] Gastroenterology Patient Name: Troy [name] Procedure Date: 07/05/2015 3:07 PM MRN: [id number] Account #: [id number] Date of Birth: [birth year] Admit Type: Inpatient Age: 44 Room: [facility] ENDO ROOM 4 Gender: Male Note Status: Finalized Procedure:         Colonoscopy Indications:       Chronic diarrhea Patient Profile:   This is a 44 year old male. Providers:         Gerrit Heck. Rayann Heman, MD Medicines:         Propofol per Anesthesia Complications:     No immediate complications. Procedure:         Pre-Anesthesia Assessment:                    - Prior to the procedure, a History and Physical was                     performed, and patient medications, allergies and                     sensitivities were reviewed. The patient's tolerance of                     previous anesthesia was reviewed.                    After obtaining informed consent, the colonoscope was                     passed under direct vision. Throughout the procedure, the                     patient's blood pressure, pulse, and oxygen saturations                     were monitored continuously. The Colonoscope was                     introduced through the anus and advanced to the the                     terminal ileum. The colonoscopy was performed without                     difficulty. The patient tolerated the procedure well. The                     quality of the bowel preparation was fair. Findings:      The perianal exam findings include non-thrombosed external hemorrhoids.      A diffuse area of moderately altered vascular, erythematous and granular       mucosa was found in the entire colon.      A 10 mm polyp was found in the sigmoid colon. The polyp was sessile. The       polyp was removed with a hot snare. Resection and retrieval were       complete although difficultt to determine whether resection was complte       due to prep quality Area was successfully injected with 3 mL [place]  ink       for tattooing.      The exam was otherwise without abnormality on direct and retroflexion       views.      The terminal ileum appeared normal.      Multiple biopsies were obtained with  cold jumbo forceps for histology       randomly in the proximal transverse colon, at the hepatic flexure, in       the ascending colon and in the cecum.      Multiple biopsies were obtained with cold forceps for histology randomly       in the descending colon and in the distal transverse colon.      Four biopsies were obtained with cold forceps for histology randomly in       the rectum. Impression:        - Suspect this is Inflammatory bowel disease but need to                     perform stool culture to r/o infection.                    - Non-thrombosed external hemorrhoids found on perianal                     exam.                    - Altered vascular, erythematous and granular mucosa in                     the entire examined colon.                    - One 9 mm polyp in the sigmoid colon. Resected and                     retrieved. Injected.                    - The examination was otherwise normal on direct and                     retroflexion views.                    - The examined portion of the ileum was normal.                    - Multiple biopsies were obtained in the proximal                     transverse colon, at the hepatic flexure, in the ascending                     colon and in the cecum.                    - Multiple biopsies were obtained in the descending colon                     and in the distal transverse colon.                    - Four biopsies were obtained in the rectum. Recommendation:    - Observe patient in GI recovery unit for 1/2 hour.                    - Continue present medications.                    - Await pathology results. Start prednisone if biopsies  confim IBD.                    - Repeat colonoscopy in 6 months for  polyp surveillance,                     prep today was not adequate for surveillance.                    - The findings and recommendations were discussed with the                     patient. Procedure Code(s): --- Professional ---                    [phone], Colonoscopy, flexible; with removal of tumor(s),                     polyp(s), or other lesion(s) by snare technique                    38250, Colonoscopy, flexible; with directed submucosal                     injection(s), any substance CPT copyright 2014 American Medical Association. All rights reserved. The codes documented in this report are preliminary and upon coder review may  be revised to meet current compliance requirements. Mellody Life, MD 07/05/2015 4:20:30 PM This report has been signed electronically. Number of Addenda: 0 Note Initiated On: 07/05/2015 3:07 PM Scope Withdrawal Time: 0 hours 29 minutes 7 seconds  Total Procedure Duration: 0 hours 31 minutes 18 seconds       [facility]

## 2015-07-05 NOTE — Progress Notes (Signed)
Patient's iv leaking right inner forearm upon flushing, attempted to start a new iv by 2 RN, attempts unsuccessful,  iv site in right ac- flushed without difficultly, will continue to monitor the patient.

## 2015-07-05 NOTE — Interval H&P Note (Signed)
History and Physical Interval Note:  07/05/2015 3:17 PM  Troy [name]  has presented today for surgery, with the diagnosis of diarrhea  The various methods of treatment have been discussed with the patient and family. After consideration of risks, benefits and other options for treatment, the patient has consented to  Procedure(s): ESOPHAGOGASTRODUODENOSCOPY (EGD) WITH PROPOFOL (N/A) COLONOSCOPY WITH PROPOFOL (N/A) as a surgical intervention .  The patient's history has been reviewed, patient examined, no change in status, stable for surgery.  I have reviewed the patient's chart and labs.  Questions were answered to the patient's satisfaction.     REIN, MATTHEW GORDON

## 2015-07-05 NOTE — Anesthesia Preprocedure Evaluation (Signed)
Anesthesia Evaluation  Patient identified by MRN, date of birth, ID band Patient awake    Reviewed: Allergy & Precautions, NPO status , Patient's Chart, lab work & pertinent test results  Airway Mallampati: III       Dental no notable dental hx.    Pulmonary COPDformer smoker,    + decreased breath sounds      Cardiovascular hypertension, Pt. on home beta blockers Normal cardiovascular exam    Neuro/Psych    GI/Hepatic negative GI ROS, (+) Hepatitis -, C  Endo/Other  Hypothyroidism Morbid obesity  Renal/GU      Musculoskeletal   Abdominal (+) + obese,   Peds  Hematology  (+) anemia ,   Anesthesia Other Findings   Reproductive/Obstetrics                             Anesthesia Physical Anesthesia Plan  ASA: III  Anesthesia Plan: General   Post-op Pain Management:    Induction: Intravenous  Airway Management Planned: Nasal Cannula  Additional Equipment:   Intra-op Plan:   Post-operative Plan:   Informed Consent: I have reviewed the patients History and Physical, chart, labs and discussed the procedure including the risks, benefits and alternatives for the proposed anesthesia with the patient or authorized representative who has indicated his/her understanding and acceptance.     Plan Discussed with: CRNA  Anesthesia Plan Comments:         Anesthesia Quick Evaluation

## 2015-07-05 NOTE — Progress Notes (Signed)
Orderly here to take patient to Endo via bed- consents on chart.

## 2015-07-06 ENCOUNTER — Inpatient Hospital Stay: Payer: 59

## 2015-07-06 LAB — CULTURE, BLOOD (ROUTINE X 2)
CULTURE: NO GROWTH
Culture: NO GROWTH

## 2015-07-06 LAB — RETICULOCYTES
RBC.: 2.7 MIL/uL — ABNORMAL LOW (ref 4.40–5.90)
RETIC CT PCT: 3 % (ref 0.4–3.1)
Retic Count, Absolute: 81 10*3/uL (ref 19.0–183.0)

## 2015-07-06 LAB — CBC
HCT: 26.3 % — ABNORMAL LOW (ref 40.0–52.0)
Hemoglobin: 8.5 g/dL — ABNORMAL LOW (ref 13.0–18.0)
MCH: 31.4 pg (ref 26.0–34.0)
MCHC: 32.1 g/dL (ref 32.0–36.0)
MCV: 97.6 fL (ref 80.0–100.0)
Platelets: 56 10*3/uL — ABNORMAL LOW (ref 150–440)
RBC: 2.7 MIL/uL — AB (ref 4.40–5.90)
RDW: 15 % — ABNORMAL HIGH (ref 11.5–14.5)
WBC: 2.8 10*3/uL — ABNORMAL LOW (ref 3.8–10.6)

## 2015-07-06 LAB — IRON AND TIBC: Iron: 21 ug/dL — ABNORMAL LOW (ref 45–182)

## 2015-07-06 LAB — COMPREHENSIVE METABOLIC PANEL
ALT: 12 U/L — ABNORMAL LOW (ref 17–63)
AST: 23 U/L (ref 15–41)
Albumin: 2.2 g/dL — ABNORMAL LOW (ref 3.5–5.0)
Alkaline Phosphatase: 112 U/L (ref 38–126)
Anion gap: 7 (ref 5–15)
BUN: 14 mg/dL (ref 6–20)
CHLORIDE: 115 mmol/L — AB (ref 101–111)
CO2: 17 mmol/L — ABNORMAL LOW (ref 22–32)
Calcium: 8.1 mg/dL — ABNORMAL LOW (ref 8.9–10.3)
Creatinine, Ser: 2.98 mg/dL — ABNORMAL HIGH (ref 0.61–1.24)
GFR calc Af Amer: 28 mL/min — ABNORMAL LOW (ref >60)
GFR calc non Af Amer: 24 mL/min — ABNORMAL LOW (ref >60)
Glucose, Bld: 184 mg/dL — ABNORMAL HIGH (ref 65–99)
Potassium: 5.4 mmol/L — ABNORMAL HIGH (ref 3.5–5.1)
SODIUM: 139 mmol/L (ref 135–145)
Total Bilirubin: 0.7 mg/dL (ref 0.3–1.2)
Total Protein: 5.2 g/dL — ABNORMAL LOW (ref 6.5–8.1)

## 2015-07-06 LAB — VITAMIN B12: Vitamin B-12: 358 pg/mL (ref 180–914)

## 2015-07-06 LAB — SEDIMENTATION RATE: Sed Rate: 68 mm/hr — ABNORMAL HIGH (ref 0–15)

## 2015-07-06 LAB — CORTISOL-AM, BLOOD: CORTISOL - AM: 6.8 ug/dL (ref 6.7–22.6)

## 2015-07-06 LAB — FOLATE: FOLATE: 35 ng/mL (ref >5.9)

## 2015-07-06 LAB — FERRITIN: Ferritin: 301 ng/mL (ref 24–336)

## 2015-07-06 MED ORDER — SODIUM BICARBONATE 8.4 % IV SOLN
INTRAVENOUS | Status: DC
  Administered 2015-07-06: 19:00:00 via INTRAVENOUS
  Filled 2015-07-06 (×4): qty 150

## 2015-07-06 MED ORDER — LEVOTHYROXINE SODIUM 25 MCG PO TABS
25.0000 ug | ORAL_TABLET | Freq: Every day | ORAL | Status: DC
  Administered 2015-07-06 – 2015-07-09 (×4): 25 ug via ORAL
  Filled 2015-07-06 (×4): qty 1

## 2015-07-06 NOTE — Progress Notes (Signed)
[facility] [facility] [place] NAME: Troy [name]    MR#:  [id number]  DATE OF BIRTH:  October 29, 1971  SUBJECTIVE:  CHIEF COMPLAINT:   Chief Complaint  Patient presents with  . Fall   No diarrhea since colonoscopy. Eating lunch now. No abd pain, no dizziness, nausea or vomiting. Does have abd distention and gas.  REVIEW OF SYSTEMS:   Review of Systems  Constitutional: Negative for fever.  Respiratory: Negative for shortness of breath.   Cardiovascular: Negative for chest pain and palpitations.  Gastrointestinal: Negative for nausea, vomiting, abdominal pain, diarrhea and blood in stool.  Genitourinary: Negative for dysuria.    DRUG ALLERGIES:  No Known Allergies  VITALS:  Blood pressure 112/70, pulse 72, temperature 97.1 F (36.2 C), temperature source Oral, resp. rate 18, height 5' 8"  (1.727 m), weight 92.035 kg (202 lb 14.4 oz), SpO2 98 %.  PHYSICAL EXAMINATION:  GENERAL:  44 y.o.-year-old patient lying in the bed with no acute distress. eating lunch LUNGS: Normal breath sounds bilaterally, no wheezing, rales, rhonchi or crepitation. No use of accessory muscles of respiration.  CARDIOVASCULAR: S1, S2 normal. No murmurs, rubs, or gallops.  ABDOMEN: Soft, ttp in BLQ and in periumbilical area, nondistended. Bowel sounds decreased. No organomegaly or mass.  EXTREMITIES: No pedal edema, cyanosis, or clubbing.  NEUROLOGIC: Cranial nerves II through XII are intact. Muscle strength 5/5 in all extremities. Sensation intact. PSYCHIATRIC: The patient is alert and oriented x 3.  SKIN: No obvious rash, lesion, or ulcer.    LABORATORY PANEL:   CBC  Recent Labs Lab 07/06/15 0608  WBC 2.8*  HGB 8.5*  HCT 26.3*  PLT 56*   ------------------------------------------------------------------------------------------------------------------  Chemistries   Recent Labs Lab 07/04/15 0609  07/06/15 0608  NA 139  < > 139  K 4.1  < > 5.4*  CL 108   < > 115*  CO2 21*  < > 17*  GLUCOSE 102*  < > 184*  BUN 9  < > 14  CREATININE 2.05*  < > 2.98*  CALCIUM 7.6*  < > 8.1*  MG 1.5*  --   --   AST  --   < > 23  ALT  --   < > 12*  ALKPHOS  --   < > 112  BILITOT  --   < > 0.7  < > = values in this interval not displayed. ------------------------------------------------------------------------------------------------------------------  Cardiac Enzymes  Recent Labs Lab 07/01/15 2103  TROPONINI 0.03   ------------------------------------------------------------------------------------------------------------------  RADIOLOGY:  US Renal  07/06/2015   CLINICAL DATA:  Renal failure.  EXAM: RENAL / URINARY TRACT ULTRASOUND COMPLETE  COMPARISON:  No previous renal ultrasound available for comparison. Comparison is made to an abdomen CT dated 07/03/2015.  FINDINGS: Right Kidney:  Length: 13.5 cm. Right kidney is normal in size and echogenicity without mass, cyst, stone, or hydronephrosis. Blood flow is shown within the right renal hilum.  Left Kidney:  Length: 12.6 cm. Left kidney is normal in size and echogenicity without mass, cyst, stone, or hydronephrosis. Blood flow is shown within the left renal hilum.  Bladder:  Bladder appears normal. Prevoid bladder volume measured at 508 cc. Final images show no significant postvoid residual.  IMPRESSION: Normal renal ultrasound.   Electronically Signed   By: Franki Cabot M.D.   On: 07/06/2015 08:55    EKG:   Orders placed or performed during the hospital encounter of 07/01/15  . ED EKG  . ED EKG  .  EKG 12-Lead  . EKG 12-Lead    ASSESSMENT AND PLAN:   1. Sepsis: The patient met septic criteria on admission with tachycardi leukocytosis and diarrhea. 2 most recent stool studies negative for c. Dif, ova and parasites, culture negative. Colon aspirate from colonoscopy pending. Blood cultures negative. Stool culture pending for possible pathogen. Continue zosyn and vanc.  2. Severe Weakness: Likely  secondary to sepsis, dehydration and hypokalemia.Continue to replace electrolytes and fluids. Continue physical therapy. Check cortisol. . 3. Diarrhea:  - Initially hospitalized 6/22 through 7/7 for diarrhea thought to be due to Clostridium difficile - Gastroenterology following. Colonoscopy on 7/21. Has had no further diarrhea since the procedure. colonoscopy showing diffuse inflammation he has been started on Solu-Medrol - C. difficile assay negative during this admission, stool culture holding for potential pathogen, colonoscopy aspirate pending  4. Alcohol withdrawal:  - No recent ativan needed.  5. Hip pain: sec to Avascular necrosis.  - Is been seen by orthopedics, Dr. Truitt Leep who is recommending OP RHA  6. Acute renal failure:  - Likely ATN due to volume loss, also developing acidosis. - Nephrology has been consult and - Start bicarbonate infusion - Renal ultrasound is normal, complement levels are pending, ANA is pending  7. Thrombocytopenia - Hold pharmacological DVT prophylaxis - No signs of bleeding  8. Anemia - Anemia panel ordered  9. Hypothyroidism - Has had persistently elevated TSH. Low-dose Synthroid started 7/22  7. DVT prophylaxis: SCDs 8. GI prophylaxis: protonix  CODE STATUS: Full  TOTAL TIME TAKING CARE OF THIS PATIENT: 45 minutes.  Greater than 50% of time spent in care coordination and counseling.All the records are reviewed and case discussed with Care Management/Social Worker. Management plans discussed with the patient and nurse.  POSSIBLE D/C IN 2-3 DAYS, DEPENDING ON CLINICAL CONDITION. Will likely need home health physical therapy   Myrtis Ser M.D on 07/06/2015 at 1:55 PM  Between 7am to 6pm - Pager - [phone]  After 6pm go to [email or web address] - password [email or web address] [facility]  [facility]  Office  [phone]  CC: Primary care physician; No PCP Per Patient

## 2015-07-06 NOTE — Progress Notes (Signed)
Dr.Walsh paged, call back received, informed MD of patient's potassium level of 5.4 and IV fluids infusing with potassium. Order received to stop IV Fluids.

## 2015-07-06 NOTE — Progress Notes (Signed)
Initial Nutrition Assessment        INTERVENTION:   Meals and snacks: Cater to pt preferences   NUTRITION DIAGNOSIS:    (none at this time) related to   as evidenced by  .    GOAL:   Patient will meet greater than or equal to 90% of their needs    MONITOR:    (Energy intake, digestive system)  REASON FOR ASSESSMENT:   LOS    ASSESSMENT:   Pt admitted after fall and diarrhea.  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 Liver damage   . Hypertension   . Neuropathy   . Seizures     complicated ETOH withdrawal  . Pancytopenia, acquired   . Ascites due to alcoholic cirrhosis   . DTs (delirium tremens)   . Hypothyroidism   . Hepatitis C     Current Nutrition: tolerating meals and eating 100% of meals  Food/Nutrition-Related History: normal intake prior to admission   Medications: nystatin, folic acid, imodium, Mag sulfate, thiamine  Electrolyte/Renal Profile and Glucose Profile:   Recent Labs Lab 07/03/15 0736 07/03/15 1005 07/04/15 0609 07/05/15 0625 07/06/15 0608  NA 140  --  139 139 139  K 2.9* 3.7 4.1 4.6 5.4*  CL 108  --  108 113* 115*  CO2 22  --  21* 19* 17*  BUN 8  --  9 11 14   CREATININE 0.91  --  2.05* 2.56* 2.98*  CALCIUM 7.4*  --  7.6* 7.8* 8.1*  MG 1.3* 1.3* 1.5*  --   --   GLUCOSE 102*  --  102* 87 184*   Protein Profile:  Recent Labs Lab 07/01/15 2103 07/05/15 0625 07/06/15 0608  ALBUMIN 3.1* 2.0* 2.2*     Last BM:7/21    Weight Change: pt reports stable weight    Diet Order:  Diet regular Room service appropriate?: Yes; Fluid consistency:: Thin  Skin:   reviewed   Height:   Ht Readings from Last 1 Encounters:  07/02/15 5\' 8"  (1.727 m)    Weight:   Wt Readings from Last 1 Encounters:  07/03/15 202 lb 14.4 oz (92.035 kg)       BMI:  Body mass index is 30.86 kg/(m^2).   EDUCATION NEEDS:   No education needs  identified at this time  LOW Care Level  Shantera Monts B. Zenia Resides, [place], [place] (pager)

## 2015-07-06 NOTE — Progress Notes (Signed)
[place] Hard Case Manager called [employer] representative (CSW) back. CSW made Tonya aware of patient's medical status. Per Kenney Houseman she will need updated clinicals sent on Monday. Per Kenney Houseman if patient does not D/C by Monday then the authorization will be void and a new one will need to be started. CSW will continue to follow and assist as needed.   Blima Rich, [place] [phone]

## 2015-07-06 NOTE — Progress Notes (Signed)
Per MD patient is not medically stable for D/C today or over the weekend. Clinical Education officer, museum (CSW) contacted [employer] at [employer] and made her aware of above. CSW also contacted [place] case Freight forwarder at [facility] and left her a voicemail making her aware of above. If patient does not D/C by Monday 07/09/15 a new [employer] authorization will need to be received. CSW will continue to follow and assist as needed.   Blima Rich, [place] [phone]

## 2015-07-06 NOTE — Progress Notes (Signed)
   07/05/15 1525  Bed Mobility  Bed Mobility Supine to Sit  Sidelying to sit Supervision  Supine to sit Supervision  General bed mobility comments Performs bed mobility with supervision for safety within the hospital. Pt has better functional strength today  Transfers  Overall transfer level Needs assistance  Equipment used Rolling walker (2 wheeled)  Transfers Sit to/from Stand  Sit to Stand Min guard;From elevated surface  General transfer comment Pt requires only cueing for hand placement during transfers, but still can be somewhat impulsive and pushes his walker out too far. Better functional strength this date  Ambulation/Gait  Ambulation/Gait assistance Min guard  Assistive device Rolling walker (2 wheeled)  Gait Pattern/deviations Step-through pattern;Decreased step length - right;Decreased step length - left;Shuffle  General Gait Details Pt needs cueing for sequencing of RW with gait. He demonstrated greater endurance today after 10 ft of ambulation both forwards and backwards alongside bed. Pt states he feels much stronger.   Gait velocity decreased  Gait velocity interpretation Below normal speed for age/gender   Late entry note secondary to [facility] malfunction. Still recommending SNF as pt is unsafe with mobility. Note written by [name], SPT. Please see PT navigator for full note details.  This note has been reviewed and this clinician agrees with information provided.  Greggory Stallion, PT, DPT [phone]

## 2015-07-06 NOTE — Consult Note (Signed)
[facility] CONSULT NOTE    Date: 07/06/2015                  Patient Name:  Troy [name]  MRN: [id number]  DOB: August 29, 1971  Age / Sex: 44 y.o., male         PCP: No PCP Per Patient                 Service Requesting Consult: Dr. Volanda Napoleon                 Reason for Consult: Acute renal failure            History of Present Illness: Patient is a 44 y.o. male with a PMHx of hypertension, peripheral neuropathy, history of alcohol withdrawal seizures, pancytopenia, ascites, hepatitis C, hypothyroidism who was admitted to [facility] on 07/01/2015 for evaluation of weakness.  The patient has been troubled with recent prolonged diarrhea. Initial workup on a prior admission revealed that he potentially had C. difficile colitis. Despite extensive treatment however his diarrhea recently has persisted. He has associated metabolic acidosis through stool bicarbonate loss. We are now asked to see him for evaluation management of acute renal failure and hyperkalemia. Serum potassium was found to be 5.4 today with a creatinine of 2.98. The patient has had elevated vancomycin levels as well this admission. On 07/03/2015 he also had a CT scan with contrast.   The patient just had a colonoscopy performed. Additional studies from the colonoscopy are still pending. He's not had any diarrhea since his colonoscopy. Prior to this he was having 5-6 bowel movements per day.   Medications: Outpatient medications: Prescriptions prior to admission  Medication Sig Dispense Refill Last Dose  . pregabalin (LYRICA) 150 MG capsule Take 150 mg by mouth 2 (two) times daily as needed (for nerve pain).   07/01/2015 at Unknown time  . QUEtiapine (SEROQUEL) 50 MG tablet Take 50 mg by mouth at bedtime as needed (for sleep).   07/01/2015 at Unknown time  . fidaxomicin (DIFICID) 200 MG TABS tablet Take 1 tablet (200 mg total) by mouth 2 (two) times daily. 10 tablet 0   . folic acid (FOLVITE) 1 MG tablet Take 1 tablet (1  mg total) by mouth daily. 30 tablet 0 unknown at unknown  . furosemide (LASIX) 40 MG tablet Take 1 tablet (40 mg total) by mouth daily. 30 tablet 0 unknown at unknown  . loperamide (IMODIUM) 2 MG capsule Take 1 capsule (2 mg total) by mouth 3 (three) times daily. 30 capsule 0   . magnesium 30 MG tablet Take 1 tablet (30 mg total) by mouth 2 (two) times daily. 14 tablet 0   . nadolol (CORGARD) 40 MG tablet Take 1 tablet (40 mg total) by mouth daily. 30 tablet 0 unknown at unknown  . pantoprazole (PROTONIX) 40 MG tablet Take 1 tablet (40 mg total) by mouth daily. 60 tablet 0   . potassium chloride SA (K-DUR,KLOR-CON) 20 MEQ tablet Take 2 tablets (40 mEq total) by mouth 2 (two) times daily. 20 tablet 0 unknown at unknown  . saccharomyces boulardii (FLORASTOR) 250 MG capsule Take 1 capsule (250 mg total) by mouth 2 (two) times daily. 20 capsule 0   . thiamine 100 MG tablet Take 1 tablet (100 mg total) by mouth daily. 30 tablet 0 unknown at unknown  . zinc oxide 20 % ointment Apply topically 2 (two) times daily as needed for irritation. Apply to button 56.7 g 0  Current medications: Current Facility-Administered Medications  Medication Dose Route Frequency Provider Last Rate Last Dose  . acetaminophen (TYLENOL) tablet 650 mg  650 mg Oral Q6H PRN Aldean Jewett, MD      . folic acid (FOLVITE) tablet 1 mg  1 mg Oral Daily Harrie Foreman, MD   1 mg at 07/06/15 1003  . HYDROcodone-acetaminophen (NORCO/VICODIN) 5-325 MG per tablet 1 tablet  1 tablet Oral Q4H PRN Aldean Jewett, MD   1 tablet at 07/06/15 1620  . levothyroxine (SYNTHROID, LEVOTHROID) tablet 25 mcg  25 mcg Oral QAC breakfast Aldean Jewett, MD   25 mcg at 07/06/15 1007  . liver oil-zinc oxide (DESITIN) 40 % ointment   Topical BID PRN Nicholes Mango, MD      . loperamide (IMODIUM) capsule 2 mg  2 mg Oral TID Harrie Foreman, MD   2 mg at 07/06/15 1620  . magnesium oxide (MAG-OX) tablet 200 mg  200 mg Oral BID Harrie Foreman,  MD   200 mg at 07/06/15 1002  . magnesium sulfate IVPB 1 g 100 mL  1 g Intravenous Once Nicholes Mango, MD      . methylPREDNISolone sodium succinate (SOLU-MEDROL) 40 mg/mL injection 20 mg  20 mg Intravenous 3 times per day Josefine Class, MD   20 mg at 07/06/15 1348  . nystatin (MYCOSTATIN/NYSTOP) topical powder   Topical TID Nicholes Mango, MD      . ondansetron (ZOFRAN) tablet 4 mg  4 mg Oral Q6H PRN Harrie Foreman, MD       Or  . ondansetron [facility]) injection 4 mg  4 mg Intravenous Q6H PRN Harrie Foreman, MD   4 mg at 07/02/15 0908  . pantoprazole (PROTONIX) EC tablet 40 mg  40 mg Oral Daily Harrie Foreman, MD   40 mg at 07/06/15 1003  . piperacillin-tazobactam (ZOSYN) IVPB 3.375 g  3.375 g Intravenous Q12H Aldean Jewett, MD   3.375 g at 07/06/15 5436  . pregabalin (LYRICA) capsule 150 mg  150 mg Oral BID PRN Harrie Foreman, MD   150 mg at 07/02/15 2122  . QUEtiapine (SEROQUEL) tablet 50 mg  50 mg Oral QHS PRN Harrie Foreman, MD   50 mg at 07/06/15 0036  . saccharomyces boulardii (FLORASTOR) capsule 250 mg  250 mg Oral BID Harrie Foreman, MD   250 mg at 07/06/15 1002  . sodium chloride 0.9 % injection 3 mL  3 mL Intravenous Q12H Harrie Foreman, MD   3 mL at 07/06/15 1005  . thiamine (VITAMIN B-1) tablet 100 mg  100 mg Oral Daily Harrie Foreman, MD   100 mg at 07/06/15 1007      Allergies: No Known Allergies    Past Medical History: Past Medical History  Diagnosis Date  . Liver damage   . Hypertension   . Neuropathy   . Seizures     complicated ETOH withdrawal  . Pancytopenia, acquired   . Ascites due to alcoholic cirrhosis   . DTs (delirium tremens)   . Hypothyroidism   . Hepatitis C      Past Surgical History: Past Surgical History  Procedure Laterality Date  . Joint replacement      Left hip replacement     Family History: Family History  Problem Relation Age of Onset  . Crohn's disease Father      Social History: History    Social History  . Marital Status: Married  Spouse Name: N/A  . Number of Children: N/A  . Years of Education: N/A   Occupational History  . Not on file.   Social History Main Topics  . Smoking status: Former Research scientist (life sciences)  . Smokeless tobacco: Not on file  . Alcohol Use: 0.0 oz/week    0 Standard drinks or equivalent per week     Comment: hx of chronic ETOH abuse. Pint a day.   . Drug Use: Not on file  . Sexual Activity: Not on file   Other Topics Concern  . Not on file   Social History Narrative     Review of Systems: Review of Systems  Constitutional: Positive for weight loss and malaise/fatigue. Negative for fever and chills.  HENT: Negative for hearing loss and tinnitus.   Eyes: Negative for blurred vision and double vision.  Respiratory: Negative for cough, hemoptysis and sputum production.   Cardiovascular: Negative for chest pain, palpitations and orthopnea.  Gastrointestinal: Positive for nausea, abdominal pain and diarrhea. Negative for vomiting.  Genitourinary: Negative for dysuria, urgency and frequency.  Musculoskeletal: Positive for back pain.  Skin: Negative for itching and rash.  Neurological: Negative for dizziness, focal weakness and headaches.  Endo/Heme/Allergies: Negative for environmental allergies. Does not bruise/bleed easily.  Psychiatric/Behavioral: Negative for depression. The patient is nervous/anxious.      Vital Signs: Blood pressure 124/68, pulse 72, temperature 97.9 F (36.6 C), temperature source Oral, resp. rate 18, height 5\' 8"  (1.727 m), weight 92.035 kg (202 lb 14.4 oz), SpO2 99 %.  Weight trends: Filed Weights   07/01/15 2059 07/02/15 0110 07/03/15 0500  Weight: 104.327 kg (230 lb) 95.255 kg (210 lb) 92.035 kg (202 lb 14.4 oz)    Physical Exam: General: NAD, laying in bed  Head: Normocephalic, atraumatic.  Eyes: Anicteric, EOMI  Nose: Mucous membranes moist, not inflammed, nonerythematous.  Throat: Oropharynx  nonerythematous, no exudate appreciated.   Neck: Supple, trachea midline.  Lungs:  Normal respiratory effort. Clear to auscultation BL without crackles or wheezes.  Heart: RRR. S1 and S2 normal without gallop, murmur, or rubs.  Abdomen:  BS normoactive. Soft, Nondistended, non-tender.  No masses or organomegaly.  Extremities: No pretibial edema.  Neurologic: A&O X3, Motor strength is 5/5 in the all 4 extremities  Skin: No visible rashes, scars.    Lab results: Basic Metabolic Panel:  Recent Labs Lab 07/01/15 2103 07/02/15 1354 07/03/15 0736 07/03/15 1005 07/04/15 0609 07/05/15 0625 07/06/15 0608  NA 137  --  140  --  139 139 139  K 3.0* 2.7* 2.9* 3.7 4.1 4.6 5.4*  CL 94*  --  108  --  108 113* 115*  CO2 25  --  22  --  21* 19* 17*  GLUCOSE 148*  --  102*  --  102* 87 184*  BUN 16  --  8  --  9 11 14   CREATININE 0.83  --  0.91  --  2.05* 2.56* 2.98*  CALCIUM 7.9*  --  7.4*  --  7.6* 7.8* 8.1*  MG 0.8* 2.2 1.3* 1.3* 1.5*  --   --     Liver Function Tests:  Recent Labs Lab 07/01/15 2103 07/05/15 0625 07/06/15 0608  AST 60* 34 23  ALT 15* 12* 12*  ALKPHOS 189* 110 112  BILITOT 2.8* 0.7 0.7  PROT 6.6 4.8* 5.2*  ALBUMIN 3.1* 2.0* 2.2*    Recent Labs Lab 07/01/15 2103  LIPASE 13*   No results for input(s): AMMONIA in the last 168  hours.  CBC:  Recent Labs Lab 07/01/15 2103 07/03/15 1005 07/05/15 0625 07/06/15 0608  WBC 16.1* 6.7 4.9 2.8*  HGB 10.7* 8.4* 8.3* 8.5*  HCT 32.3* 25.6* 25.3* 26.3*  MCV 93.5 96.2 97.7 97.6  PLT 122* 50* 47* 56*    Cardiac Enzymes:  Recent Labs Lab 07/01/15 2103  TROPONINI 0.03    BNP: Invalid input(s): POCBNP  CBG:  Recent Labs Lab 07/03/15 0731  GLUCAP 85    Microbiology: Results for orders placed or performed during the hospital encounter of 07/01/15  Culture, blood (routine x 2)     Status: None   Collection Time: 07/01/15 10:24 PM  Result Value Ref Range Status   Specimen Description BLOOD LEFT ARM   Final   Special Requests BOTTLES DRAWN AEROBIC AND ANAEROBIC 10CC  Final   Culture NO GROWTH 5 DAYS  Final   Report Status 07/06/2015 FINAL  Final  Culture, blood (routine x 2)     Status: None   Collection Time: 07/01/15 10:30 PM  Result Value Ref Range Status   Specimen Description BLOOD RIGHT ARM  Final   Special Requests BOTTLES DRAWN AEROBIC AND ANAEROBIC 10CC  Final   Culture NO GROWTH 5 DAYS  Final   Report Status 07/06/2015 FINAL  Final  C difficile quick scan w PCR reflex ([facility] only)     Status: None   Collection Time: 07/03/15  8:30 AM  Result Value Ref Range Status   C Diff antigen NEGATIVE  Final   C Diff toxin NEGATIVE  Final   C Diff interpretation Negative for C. difficile  Final  Stool culture     Status: None (Preliminary result)   Collection Time: 07/05/15  4:30 PM  Result Value Ref Range Status   Specimen Description STOOL  Final   Special Requests NONE  Final   Culture HOLDING FOR POSSIBLE PATHOGEN  Final   Report Status PENDING  Incomplete  Culture, blood (routine x 2)     Status: None (Preliminary result)   Collection Time: 07/05/15  6:42 PM  Result Value Ref Range Status   Specimen Description BLOOD RIGHT ANTECUBITAL  Final   Special Requests BOTTLES DRAWN AEROBIC AND ANAEROBIC 10ML  Final   Culture NO GROWTH < 24 HOURS  Final   Report Status PENDING  Incomplete  Culture, blood (routine x 2)     Status: None (Preliminary result)   Collection Time: 07/05/15  6:48 PM  Result Value Ref Range Status   Specimen Description BLOOD RHAND  Final   Special Requests BOTTLES DRAWN AEROBIC AND ANAEROBIC 1ML  Final   Culture NO GROWTH < 24 HOURS  Final   Report Status PENDING  Incomplete    Coagulation Studies: No results for input(s): LABPROT, INR in the last 72 hours.  Urinalysis:  Recent Labs  07/05/15 1443  COLORURINE STRAW*  LABSPEC 1.005  PHURINE 5.0  GLUCOSEU NEGATIVE  HGBUR NEGATIVE  BILIRUBINUR NEGATIVE  KETONESUR NEGATIVE  PROTEINUR NEGATIVE   NITRITE NEGATIVE  LEUKOCYTESUR NEGATIVE      Imaging: US Renal  07/06/2015   CLINICAL DATA:  Renal failure.  EXAM: RENAL / URINARY TRACT ULTRASOUND COMPLETE  COMPARISON:  No previous renal ultrasound available for comparison. Comparison is made to an abdomen CT dated 07/03/2015.  FINDINGS: Right Kidney:  Length: 13.5 cm. Right kidney is normal in size and echogenicity without mass, cyst, stone, or hydronephrosis. Blood flow is shown within the right renal hilum.  Left Kidney:  Length: 12.6 cm.  Left kidney is normal in size and echogenicity without mass, cyst, stone, or hydronephrosis. Blood flow is shown within the left renal hilum.  Bladder:  Bladder appears normal. Prevoid bladder volume measured at 508 cc. Final images show no significant postvoid residual.  IMPRESSION: Normal renal ultrasound.   Electronically Signed   By: Franki Cabot M.D.   On: 07/06/2015 08:55      Assessment & Plan: 44 y.o. male with a PMHx of hypertension, peripheral neuropathy, history of alcohol withdrawal seizures, pancytopenia, ascites, hepatitis C, hypothyroidism who was admitted to [facility] on 07/01/2015 for evaluation of weakness.    1. Acute renal failure. 2. Hyperkalemia. 3. Metabolic acidosis. 4. Anemia unspecified. 5. Prolonged diarrhea.  Plan: We will consulted for the evaluation management of acute renal failure. This is likely multifactorial with contributions from ongoing volume loss, elevated vancomycin level, and contrast exposure earlier in the hospitalization. Patient was previously on IV fluids supplemented with potassium. These were discontinued given hyperkalemia. At this point in time we will start the patient on sodium bicarbonate drip and given low serum bicarbonate. We will monitor renal function as well as serum electro lites. No urgent indication for dialysis at present. The patient is being followed by gastroneurology for his prolonged and persistent diarrhea. Further plan as patient  progresses. Thanks for consultation.

## 2015-07-06 NOTE — Progress Notes (Signed)
Orderly-Pam here to take patient for renal ultrasound. Hall pass in chart.

## 2015-07-06 NOTE — Progress Notes (Signed)
ANTIBIOTIC CONSULT NOTE - FOLLOW UP  Pharmacy Consult for Vancomycin and Zosyn  Indication: Sepsis  No Known Allergies  Patient Measurements: Height: 5\' 8"  (172.7 cm) Weight: 202 lb 14.4 oz (92.035 kg) IBW/kg (Calculated) : 68.4   Vital Signs: Temp: 96.2 F (35.7 C) (07/22 0733) Temp Source: Oral (07/22 0733) BP: 122/86 mmHg (07/22 0733) Pulse Rate: 66 (07/22 0733) Intake/Output from previous day: 07/21 0701 - 07/22 0700 In: 6487.8 [I.V.:6487.8] Out: 3010 [Urine:3010] Intake/Output from this shift: Total I/O In: -  Out: 550 [Urine:550]  Labs:  Recent Labs  07/04/15 0609 07/05/15 0625 07/06/15 0608  WBC  --  4.9 2.8*  HGB  --  8.3* 8.5*  PLT  --  47* 56*  CREATININE 2.05* 2.56* 2.98*   Estimated Creatinine Clearance: 34.8 mL/min (by C-G formula based on Cr of 2.98).  Recent Labs  07/03/15 1801 07/04/15 1242 07/05/15 1206  VANCOTROUGH 34*  --   --   VANCORANDOM  --  25 21     Microbiology: Recent Results (from the past 720 hour(s))  Blood Culture (routine x 2)     Status: None   Collection Time: 06/06/15  4:14 PM  Result Value Ref Range Status   Specimen Description BLOOD  Final   Special Requests NONE  Final   Culture NO GROWTH 5 DAYS  Final   Report Status 06/11/2015 FINAL  Final  Stool culture     Status: None   Collection Time: 06/06/15  4:14 PM  Result Value Ref Range Status   Specimen Description STOOL  Final   Special Requests Normal  Final   Culture   Final    NO SALMONELLA OR SHIGELLA ISOLATED No Pathogenic E. coli detected    Report Status 06/10/2015 FINAL  Final  C difficile quick scan w PCR reflex ([facility] only)     Status: None   Collection Time: 06/06/15  4:20 PM  Result Value Ref Range Status   C Diff antigen POSITIVE  Final   C Diff toxin NEGATIVE  Final   C Diff interpretation   Final    Positive for toxigenic C. difficile, active toxin production not detected. Patient has toxigenic C. difficile organisms present in the bowel,  but toxin was not detected. The patient may be a carrier or the level of toxin in the sample was below the limit  of detection. This information should be used in conjunction with the patient's clinical history when deciding on possible therapy.     Comment: CRITICAL RESULT CALLED TO, READ BACK BY AND VERIFIED WITH: LISA THOMPSON 06/06/15 AT 2033.SDR   Blood Culture (routine x 2)     Status: None   Collection Time: 06/06/15  4:20 PM  Result Value Ref Range Status   Specimen Description BLOOD  Final   Special Requests NONE  Final   Culture  Setup Time   Final    GRAM POSITIVE RODS ANAEROBIC BOTTLE ONLY CRITICAL RESULT CALLED TO, READ BACK BY AND VERIFIED WITH: BRITNEY KILLINGSWORTH AT 5643 06/07/15 DV    Culture   Final    CLOSTRIDIUM PARAPUTRIFICUM ANAEROBIC BOTTLE ONLY COAGULASE NEGATIVE STAPHYLOCOCCUS AEROBIC BOTTLE ONLY POSSIBLE CONTAMINATION WITH SKIN FLORA     Report Status 06/12/2015 FINAL  Final   Organism ID, Bacteria CLOSTRIDIUM PARAPUTRIFICUM  Final  Clostridium Difficile by PCR (not at [facility])     Status: Abnormal   Collection Time: 06/06/15  4:20 PM  Result Value Ref Range Status   C difficile by pcr  POSITIVE (A) NEGATIVE Final    Comment: Positive for toxigenic C. difficile, active toxin production not detected. Patient has toxigenic C. difficile organisms present in the bowel, but toxin was not detected. The patient may be a carrier or the level of toxin in the sample was below the limit  of detection. This information should be used in conjunction with the patient's clinical history when deciding on possible therapy. CRITICAL RESULT CALLED TO, READ BACK BY AND VERIFIED WITH: LISA THOMPSON 06/06/15 2030. SDW   Giardia, EIA; Ova/Parasite     Status: None   Collection Time: 06/06/15  5:09 PM  Result Value Ref Range Status   Ova + Parasite Exam Final report  Final    Comment: (NOTE) These results were obtained using wet preparation(s) and trichrome stained smear. This  test does not include testing for Cryptosporidium parvum, Cyclospora, or Microsporidia.    Giardia Ag, Stl Negative Negative Final    Comment: (NOTE) Performed At: [facility] [place], [place] [id number] Caprice Red MD [id number] Performed At: [facility] [place], [place] [id number] Lindon Romp MD [id number]    Source of Sample STOOL  Corrected  MRSA PCR Screening     Status: None   Collection Time: 06/06/15  9:00 PM  Result Value Ref Range Status   MRSA by PCR NEGATIVE NEGATIVE Final    Comment:        The GeneXpert MRSA Assay (FDA approved for NASAL specimens only), is one component of a comprehensive MRSA colonization surveillance program. It is not intended to diagnose MRSA infection nor to guide or monitor treatment for MRSA infections.   Urine culture     Status: None   Collection Time: 06/07/15  3:41 PM  Result Value Ref Range Status   Specimen Description URINE, CLEAN CATCH  Final   Special Requests NONE  Final   Culture NO GROWTH 2 DAYS  Final   Report Status 06/09/2015 FINAL  Final  C difficile quick scan w PCR reflex ([facility] only)     Status: None   Collection Time: 06/12/15  4:21 PM  Result Value Ref Range Status   C Diff antigen NEGATIVE  Final   C Diff toxin NEGATIVE  Final   C Diff interpretation Negative for C. difficile  Final  C difficile quick scan w PCR reflex ([facility] only)     Status: None   Collection Time: 06/18/15 10:30 PM  Result Value Ref Range Status   C Diff antigen NEGATIVE  Final   C Diff toxin NEGATIVE  Final   C Diff interpretation Negative for C. difficile  Final  Culture, blood (routine x 2)     Status: None (Preliminary result)   Collection Time: 07/01/15 10:24 PM  Result Value Ref Range Status   Specimen Description BLOOD LEFT ARM  Final   Special Requests BOTTLES DRAWN AEROBIC AND ANAEROBIC 10CC  Final   Culture NO GROWTH 2 DAYS  Final   Report Status PENDING   Incomplete  Culture, blood (routine x 2)     Status: None (Preliminary result)   Collection Time: 07/01/15 10:30 PM  Result Value Ref Range Status   Specimen Description BLOOD RIGHT ARM  Final   Special Requests BOTTLES DRAWN AEROBIC AND ANAEROBIC 10CC  Final   Culture NO GROWTH 2 DAYS  Final   Report Status PENDING  Incomplete  C difficile quick scan w PCR reflex ([facility] only)     Status:  None   Collection Time: 07/03/15  8:30 AM  Result Value Ref Range Status   C Diff antigen NEGATIVE  Final   C Diff toxin NEGATIVE  Final   C Diff interpretation Negative for C. difficile  Final  Stool culture     Status: None (Preliminary result)   Collection Time: 07/05/15  4:30 PM  Result Value Ref Range Status   Specimen Description STOOL  Final   Special Requests NONE  Final   Culture HOLDING FOR POSSIBLE PATHOGEN  Final   Report Status PENDING  Incomplete    Anti-infectives    Start     Dose/Rate Route Frequency Ordered Stop   07/05/15 1800  vancomycin (VANCOCIN) IVPB 1000 mg/200 mL premix     1,000 mg 200 mL/hr over 60 Minutes Intravenous  Once 07/05/15 1346 07/05/15 1950   07/05/15 1200  vancomycin (VANCOCIN) IVPB 1000 mg/200 mL premix  Status:  Discontinued     1,000 mg 200 mL/hr over 60 Minutes Intravenous Every 48 hours 07/04/15 1451 07/05/15 0818   07/05/15 0600  vancomycin (VANCOCIN) IVPB 1000 mg/200 mL premix  Status:  Discontinued     1,000 mg 200 mL/hr over 60 Minutes Intravenous Every 36 hours 07/03/15 1900 07/04/15 1005   07/04/15 1830  piperacillin-tazobactam (ZOSYN) IVPB 3.375 g     3.375 g 12.5 mL/hr over 240 Minutes Intravenous Every 12 hours 07/04/15 0957     07/03/15 2000  vancomycin (VANCOCIN) 1,250 mg in sodium chloride 0.9 % 250 mL IVPB  Status:  Discontinued     1,250 mg 166.7 mL/hr over 90 Minutes Intravenous Every 18 hours 07/03/15 0911 07/03/15 1849   07/02/15 1900  metroNIDAZOLE (FLAGYL) IVPB 500 mg  Status:  Discontinued     500 mg 100 mL/hr over 60 Minutes  Intravenous Every 8 hours 07/02/15 1851 07/05/15 1339   07/02/15 0900  piperacillin-tazobactam (ZOSYN) IVPB 3.375 g  Status:  Discontinued     3.375 g 12.5 mL/hr over 240 Minutes Intravenous 3 times per day 07/02/15 0245 07/04/15 0957   07/02/15 0800  vancomycin (VANCOCIN) 1,250 mg in sodium chloride 0.9 % 250 mL IVPB  Status:  Discontinued     1,250 mg 166.7 mL/hr over 90 Minutes Intravenous Every 8 hours 07/02/15 0226 07/03/15 0908   07/02/15 0045  vancomycin (VANCOCIN) 1,250 mg in sodium chloride 0.9 % 250 mL IVPB     1,250 mg 166.7 mL/hr over 90 Minutes Intravenous  Once 07/02/15 0037 07/02/15 0336   07/02/15 0045  piperacillin-tazobactam (ZOSYN) IVPB 3.375 g     3.375 g 100 mL/hr over 30 Minutes Intravenous  Once 07/02/15 0037 07/02/15 0306      Assessment: Patient previously on vancomycin 1250 mg iv q 8 h with elevated trough of 41 mcg/ml. Of note, patient is on Lasix. Subsequent trough resulted at 34 mcg/ml with calculated ke of 0.0178.  Scr: up to 2.0 mg/dl. Likely cause of the high Vancomycin level. Random level ordered to be drawn @ 1200.  07/05/15 vancomycin trough this afternoon was 21 mcg/mL.  7/22: Patient was redosed Vancomycin 1 g IV x 1 @ 1850.   Goal of Therapy:  Vancomycin trough level 15-20 mcg/ml  Plan:  Dosing Vancomycin per levels. Vancomycin level ordered @18 :00 on 7/23.   Zosyn: Will continue Zosyn 3.375 g q12 hours secondary to AKI despite calculate CrCL being >20 ml/min.    Troy [name] D 07/06/2015,11:37 AM

## 2015-07-06 NOTE — Progress Notes (Signed)
PT Cancellation Note  Patient Details Name: VIDAL LAMPKINS MRN: [id number] DOB: [birth year]   Cancelled Treatment:    Reason Eval/Treat Not Completed:  (see PT cancellation note for further details). Pt underwent general anesthesia for procedures 07/05/15. Will require new order for therapy to resume. Will complete current order. Pt also with elevated K+ this date.   Sebastian Lurz 07/06/2015, 12:22 PM Greggory Stallion, PT, DPT [phone]

## 2015-07-06 NOTE — Progress Notes (Signed)
GI Inpatient Follow-up Note  Patient Identification: Troy [name] is a 44 y.o. male with ETOH cirrhosis, c/b grade 2 varices a/w chronic diarrhea.  Subjective:  Very little stol since colonoscopy.  Diarrhea resolved.  NO bleeding, no abd pain, no f/c.   Scheduled Inpatient Medications:  . folic acid  1 mg Oral Daily  . levothyroxine  25 mcg Oral QAC breakfast  . loperamide  2 mg Oral TID  . magnesium oxide  200 mg Oral BID  . magnesium sulfate 1 - 4 g bolus IVPB  1 g Intravenous Once  . methylPREDNISolone (SOLU-MEDROL) injection  20 mg Intravenous 3 times per day  . nystatin   Topical TID  . pantoprazole  40 mg Oral Daily  . piperacillin-tazobactam (ZOSYN)  IV  3.375 g Intravenous Q12H  . saccharomyces boulardii  250 mg Oral BID  . sodium chloride  3 mL Intravenous Q12H  . thiamine  100 mg Oral Daily    Continuous Inpatient Infusions:     PRN Inpatient Medications:  acetaminophen, HYDROcodone-acetaminophen, liver oil-zinc oxide, ondansetron **OR** ondansetron (ZOFRAN) IV, pregabalin, QUEtiapine    Physical Examination: BP 112/70 mmHg  Pulse 72  Temp(Src) 97.1 F (36.2 C) (Oral)  Resp 18  Ht 5\' 8"  (1.727 m)  Wt 92.035 kg (202 lb 14.4 oz)  BMI 30.86 kg/m2  SpO2 98% Gen: NAD, alert and oriented x 4 Neck: supple, no JVD or thyromegaly Chest: CTA bilaterally, no wheezes, crackles, or other adventitious sounds CV: RRR, no m/g/c/r Abd: obese, ild diffuse ttp, no r/g Ext: no edema, well perfused with 2+ pulses, Lymph: no LAD  Data: Lab Results  Component Value Date   WBC 2.8* 07/06/2015   HGB 8.5* 07/06/2015   HCT 26.3* 07/06/2015   MCV 97.6 07/06/2015   PLT 56* 07/06/2015    Recent Labs Lab 07/03/15 1005 07/05/15 0625 07/06/15 0608  HGB 8.4* 8.3* 8.5*   Lab Results  Component Value Date   NA 139 07/06/2015   K 5.4* 07/06/2015   CL 115* 07/06/2015   CO2 17* 07/06/2015   BUN 14 07/06/2015   CREATININE 2.98* 07/06/2015   Lab Results  Component Value  Date   ALT 12* 07/06/2015   AST 23 07/06/2015   ALKPHOS 112 07/06/2015   BILITOT 0.7 07/06/2015   No results for input(s): APTT, INR, PTT in the last 168 hours.   Assessment/Plan: Troy [name] is a 44 y.o. male with chronic diarrhea, now with renal failure, also with ETOH cirrhosis c/b grade 2 varices  Recommendations: - cont IV solumedrol 20 q8 - f/u o and p and stool culture - restart nadolol once diarrhea resolved and ARF resolved.  - f/u colon biopsies  Please call with questions or concerns.  Aishah Teffeteller, Grace Blight, MD

## 2015-07-07 ENCOUNTER — Encounter: Payer: Self-pay | Admitting: Gastroenterology

## 2015-07-07 LAB — CBC
HEMATOCRIT: 26.2 % — AB (ref 40.0–52.0)
Hemoglobin: 8.6 g/dL — ABNORMAL LOW (ref 13.0–18.0)
MCH: 32.2 pg (ref 26.0–34.0)
MCHC: 32.7 g/dL (ref 32.0–36.0)
MCV: 98.4 fL (ref 80.0–100.0)
Platelets: 73 10*3/uL — ABNORMAL LOW (ref 150–440)
RBC: 2.66 MIL/uL — ABNORMAL LOW (ref 4.40–5.90)
RDW: 15.2 % — ABNORMAL HIGH (ref 11.5–14.5)
WBC: 4.4 10*3/uL (ref 3.8–10.6)

## 2015-07-07 LAB — COMPREHENSIVE METABOLIC PANEL
ALBUMIN: 2.3 g/dL — AB (ref 3.5–5.0)
ALK PHOS: 110 U/L (ref 38–126)
ALT: 10 U/L — AB (ref 17–63)
AST: 18 U/L (ref 15–41)
Anion gap: 7 (ref 5–15)
BILIRUBIN TOTAL: 0.5 mg/dL (ref 0.3–1.2)
BUN: 20 mg/dL (ref 6–20)
CO2: 20 mmol/L — ABNORMAL LOW (ref 22–32)
CREATININE: 2.89 mg/dL — AB (ref 0.61–1.24)
Calcium: 8 mg/dL — ABNORMAL LOW (ref 8.9–10.3)
Chloride: 113 mmol/L — ABNORMAL HIGH (ref 101–111)
GFR calc Af Amer: 29 mL/min — ABNORMAL LOW (ref >60)
GFR, EST NON AFRICAN AMERICAN: 25 mL/min — AB (ref >60)
Glucose, Bld: 179 mg/dL — ABNORMAL HIGH (ref 65–99)
POTASSIUM: 5.1 mmol/L (ref 3.5–5.1)
Sodium: 140 mmol/L (ref 135–145)
Total Protein: 5.2 g/dL — ABNORMAL LOW (ref 6.5–8.1)

## 2015-07-07 LAB — ANA COMPREHENSIVE PANEL
Anti JO-1: <0.2 AI (ref 0.0–0.9)
Ribonucleic Protein: 0.6 AI (ref 0.0–0.9)
Scleroderma (Scl-70) (ENA) Antibody, IgG: <0.2 AI (ref 0.0–0.9)
ds DNA Ab: <1 IU/mL (ref 0–9)

## 2015-07-07 LAB — CREATININE, SERUM
CREATININE: 2.98 mg/dL — AB (ref 0.61–1.24)
GFR calc Af Amer: 28 mL/min — ABNORMAL LOW (ref >60)
GFR calc non Af Amer: 24 mL/min — ABNORMAL LOW (ref >60)

## 2015-07-07 LAB — C4 COMPLEMENT: Complement C4, Body Fluid: 20 mg/dL (ref 14–44)

## 2015-07-07 LAB — CORTISOL-AM, BLOOD: Cortisol - AM: 3.8 ug/dL — ABNORMAL LOW (ref 6.7–22.6)

## 2015-07-07 LAB — C3 COMPLEMENT: C3 Complement: 99 mg/dL (ref 82–167)

## 2015-07-07 MED ORDER — PREDNISONE 20 MG PO TABS
40.0000 mg | ORAL_TABLET | Freq: Every day | ORAL | Status: DC
  Administered 2015-07-08 – 2015-07-09 (×2): 40 mg via ORAL
  Filled 2015-07-07 (×2): qty 2

## 2015-07-07 NOTE — Progress Notes (Signed)
Physical Therapy Evaluation Patient Details Name: Troy [name] MRN: [id number] DOB: [birth year] Today's Date: 07/07/2015   History of Present Illness  Pt is a 44 year old male who was admitted to the hospital for chronic diarrhea on 17 July. Had a colonoscopy performed on 21 July, so a new PT evaluation was required.  Clinical Impression  Patient demonstrates good short term memory recall of events that have occurred over past week. Patient demonstrated good safety awareness and did not pose as a fall risk during evaluation. Was able to sit/stand unsupported, allowing for a gown change. Patient usually performs grocery shopping with modified independence and is currently demonstrating slight deviation from baseline strength/function. Patient will benefit from going to a SNF to improve strength and endurance, allowing him to return to PLOF.    Follow Up Recommendations SNF;Other (comment) (Patient plans on going to [facility])    Equipment Recommendations  None recommended by PT    Recommendations for Other Services Rehab consult     Precautions / Restrictions Precautions Precautions: Fall Restrictions Weight Bearing Restrictions: No      Mobility  Bed Mobility Overal bed mobility: Independent Bed Mobility: Supine to Sit     Supine to sit: Independent     General bed mobility comments: Patient performed bed mobility independently with good safety awareness.  Transfers Overall transfer level: Modified independent Equipment used: Rolling walker (2 wheeled) Transfers: Sit to/from [facility] Sit to Stand: Modified independent (Device/Increase time) Stand pivot transfers: Modified independent (Device/Increase time)       General transfer comment: Patient demonstrates good safety awareness during transfers. Was able to stand with no UE to allow for gown change.  Ambulation/Gait Ambulation/Gait assistance: Modified independent (Device/Increase  time) Ambulation Distance (Feet): 35 Feet Assistive device: Rolling walker (2 wheeled) Gait Pattern/deviations: Step-through pattern;Wide base of support     General Gait Details: Patient ambulates with step through gait pattern. Demonstrates wide BOS. No LOB.  Stairs            Wheelchair Mobility    Modified Rankin (Stroke Patients Only)       Balance Overall balance assessment: Modified Independent Sitting-balance support: No upper extremity supported Sitting balance-Leahy Scale: Good     Standing balance support: Bilateral upper extremity supported Standing balance-Leahy Scale: Good                               Pertinent Vitals/Pain Pain Assessment: No/denies pain    Home Living Family/patient expects to be discharged to:: Skilled nursing facility Living Arrangements: Alone;Other (Comment) (Patient's wife and 2 grandchildren are temporarily separated) Available Help at Discharge: Family;Available PRN/intermittently Type of Home: House Home Access: Ramped entrance;Other (comment) (with bilateral rails)     Home Layout: One level;Other (Comment) (Has basement but has no need to go downstairs) Home Equipment: Walker - 2 wheels;Walker - 4 wheels;Cane - quad      Prior Function Level of Independence: Independent with assistive device(s) (Patient cycled between ADs depending on how he felt each day)               Hand Dominance        Extremity/Trunk Assessment   Upper Extremity Assessment: Overall WFL for tasks assessed           Lower Extremity Assessment: Overall WFL for tasks assessed         Communication   Communication: No difficulties  Cognition Arousal/Alertness: Awake/alert  Behavior During Therapy: WFL for tasks assessed/performed Overall Cognitive Status: Within Functional Limits for tasks assessed                      General Comments      Exercises        Assessment/Plan    PT Assessment  Patient needs continued PT services  PT Diagnosis Generalized weakness   PT Problem List Decreased strength  PT Treatment Interventions Gait training;Therapeutic exercise;Therapeutic activities;Patient/family education   PT Goals (Current goals can be found in the Care Plan section) Acute Rehab PT Goals Patient Stated Goal: "I want to be stronger." PT Goal Formulation: With patient Time For Goal Achievement: 07/21/15 Potential to Achieve Goals: Good    Frequency Min 2X/week   Barriers to discharge Decreased caregiver support      Co-evaluation               End of Session Equipment Utilized During Treatment: Gait belt Activity Tolerance: Patient tolerated treatment well;No increased pain Patient left: in chair;with call bell/phone within reach;with chair alarm set Nurse Communication: Mobility status         Time: 0350-0938 PT Time Calculation (min) (ACUTE ONLY): 41 min   Charges:   PT Evaluation $Initial PT Evaluation Tier I: 1 Procedure     PT G Codes:        Dorice Lamas, PT, DPT 07/07/2015, 12:30 PM

## 2015-07-07 NOTE — Progress Notes (Signed)
Per MD patient will likely D/C to [facility] on Sunday 07/07/14. Clinical Education officer, museum (CSW) contacted [employer] at [employer] and made her aware of above. Per Helene Kelp patient can admit to [facility] Sunday. CSW made patient aware of above. Patient reported that his sister or cousin can provide transport tomorrow. CSW will continue to follow and assist as needed.   Blima Rich, [facility] [phone]

## 2015-07-07 NOTE — Consult Note (Signed)
  GI Inpatient Follow-up Note  Patient Identification: Troy [name] is a 44 y.o. male with hep C cirrhosis and chronic diarrhea.  Subjective: No BM since colonoscopy. Colon suggests IBD though path still pending. On iv solumedrol. 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 Scheduled Inpatient Medications:  . folic acid  1 mg Oral Daily  . levothyroxine  25 mcg Oral QAC breakfast  . loperamide  2 mg Oral TID  . magnesium oxide  200 mg Oral BID  . magnesium sulfate 1 - 4 g bolus IVPB  1 g Intravenous Once  . nystatin   Topical TID  . pantoprazole  40 mg Oral Daily  . [START ON 07/08/2015] predniSONE  40 mg Oral Q breakfast  . saccharomyces boulardii  250 mg Oral BID  . sodium chloride  3 mL Intravenous Q12H  . thiamine  100 mg Oral Daily    Continuous Inpatient Infusions:   .  sodium bicarbonate  infusion 1000 mL 75 mL/hr at 07/06/15 1843    PRN Inpatient Medications:  acetaminophen, HYDROcodone-acetaminophen, liver oil-zinc oxide, ondansetron **OR** ondansetron (ZOFRAN) IV, pregabalin, QUEtiapine  Review of Systems: Constitutional: Weight is stable.  Eyes: No changes in vision. ENT: No oral lesions, sore throat.  GI: see HPI.  Heme/Lymph: No easy bruising.  CV: No chest pain.  GU: No hematuria.  Integumentary: No rashes.  Neuro: No headaches.  Psych: No depression/anxiety.  Endocrine: No heat/cold intolerance.  Allergic/Immunologic: No urticaria.  Resp: No cough, SOB.  Musculoskeletal: No joint swelling.    Physical Examination: BP 135/79 mmHg  Pulse 64  Temp(Src) 97.8 F (36.6 C) (Oral)  Resp 18  Ht 5\' 8"  (1.727 m)  Wt 100.018 kg (220 lb 8 oz)  BMI 33.53 kg/m2  SpO2 100% Gen: NAD, alert and oriented x 4 HEENT: PEERLA, EOMI, Neck: supple, no JVD or thyromegaly Chest: CTA bilaterally, no wheezes, crackles, or other adventitious sounds CV: RRR, no m/g/c/r Abd: soft, NT, ND, +BS in all four quadrants; no HSM, guarding, ridigity, or rebound tenderness Ext: no edema, well  perfused with 2+ pulses, Skin: no rash or lesions noted Lymph: no LAD  Data: Lab Results  Component Value Date   WBC 4.4 07/07/2015   HGB 8.6* 07/07/2015   HCT 26.2* 07/07/2015   MCV 98.4 07/07/2015   PLT 73* 07/07/2015    Recent Labs Lab 07/05/15 0625 07/06/15 0608 07/07/15 0446  HGB 8.3* 8.5* 8.6*   Lab Results  Component Value Date   NA 140 07/07/2015   K 5.1 07/07/2015   CL 113* 07/07/2015   CO2 20* 07/07/2015   BUN 20 07/07/2015   CREATININE 2.89* 07/07/2015   Lab Results  Component Value Date   ALT 10* 07/07/2015   AST 18 07/07/2015   ALKPHOS 110 07/07/2015   BILITOT 0.5 07/07/2015   No results for input(s): APTT, INR, PTT in the last 168 hours. Assessment/Plan: Troy [name] is a 44 y.o. male with chronic diarrhea. Resolved.    Recommendations: Agree with advancing diet. No need for Abx for diarrhea. Ok for discharge by tomorrow if remains stable on prednisone 40mg  daily until pt f/u with Dr. Rayann Heman in 1-2 weeks. thanks Please call with questions or concerns.  Asami Lambright, Lupita Dawn, MD

## 2015-07-07 NOTE — Progress Notes (Addendum)
Patient VSS this shift. Condition improved enough for dc tomorrow to [facility].  Per Dr. Serita Grit d/c fluids, tele, & contact/enteric precautions.

## 2015-07-07 NOTE — Progress Notes (Signed)
[facility] [place] NAME: Troy [name]    MR#:  [id number]  DATE OF BIRTH:  [birth year]  SUBJECTIVE:  Feeling ok. Eating well  REVIEW OF SYSTEMS:   Review of Systems  Constitutional: Negative for fever, chills and weight loss.  HENT: Negative for ear discharge, ear pain and nosebleeds.   Eyes: Negative for blurred vision, pain and discharge.  Respiratory: Negative for sputum production, shortness of breath, wheezing and stridor.   Cardiovascular: Negative for chest pain, palpitations, orthopnea and PND.  Gastrointestinal: Negative for nausea, vomiting, abdominal pain and diarrhea.  Genitourinary: Negative for urgency and frequency.  Musculoskeletal: Negative for back pain and joint pain.  Neurological: Negative for sensory change, speech change, focal weakness and weakness.  Psychiatric/Behavioral: Negative for depression. The patient is not nervous/anxious.   All other systems reviewed and are negative.  Tolerating Diet:yes Tolerating PT: yes rec STR  DRUG ALLERGIES:  No Known Allergies  VITALS:  Blood pressure 106/74, pulse 81, temperature 97.5 F (36.4 C), temperature source Oral, resp. rate 18, height _0  (1.727 m), weight 100.018 kg (220 lb 8 oz), SpO2 100 %.  PHYSICAL EXAMINATION:   Physical Exam  GENERAL:  44 y.o.-year-old patient lying in the bed with no acute distress.  EYES: Pupils equal, round, reactive to light and accommodation. No scleral icterus. Extraocular muscles intact.  HEENT: Head atraumatic, normocephalic. Oropharynx and nasopharynx clear.  NECK:  Supple, no jugular venous distention. No thyroid enlargement, no tenderness.  LUNGS: Normal breath sounds bilaterally, no wheezing, rales, rhonchi. No use of accessory muscles of respiration.  CARDIOVASCULAR: S1, S2 normal. No murmurs, rubs, or gallops.  ABDOMEN: Soft, nontender, nondistended. Bowel sounds present. No organomegaly or mass.  EXTREMITIES: No  cyanosis, clubbing or edema b/l.    NEUROLOGIC: Cranial nerves II through XII are intact. No focal Motor or sensory deficits b/l.   PSYCHIATRIC: The patient is alert and oriented x 3.  SKIN: No obvious rash, lesion, or ulcer.    LABORATORY PANEL:   CBC  Recent Labs Lab 07/07/15 0446  WBC 4.4  HGB 8.6*  HCT 26.2*  PLT 73*    Chemistries   Recent Labs Lab 07/04/15 0609  07/07/15 0446 07/07/15 1210  NA 139  < > 140  --   K 4.1  < > 5.1  --   CL 108  < > 113*  --   CO2 21*  < > 20*  --   GLUCOSE 102*  < > 179*  --   BUN 9  < > 20  --   CREATININE 2.05*  < > 2.89* 2.98*  CALCIUM 7.6*  < > 8.0*  --   MG 1.5*  --   --   --   AST  --   < > 18  --   ALT  --   < > 10*  --   ALKPHOS  --   < > 110  --   BILITOT  --   < > 0.5  --   < > = values in this interval not displayed.  Cardiac Enzymes  Recent Labs Lab 07/01/15 2103  TROPONINI 0.03    RADIOLOGY:  US Renal  07/06/2015   CLINICAL DATA:  Renal failure.  EXAM: RENAL / URINARY TRACT ULTRASOUND COMPLETE  COMPARISON:  No previous renal ultrasound available for comparison. Comparison is made to an abdomen CT dated 07/03/2015.  FINDINGS: Right Kidney:  Length: 13.5 cm. Right kidney  is normal in size and echogenicity without mass, cyst, stone, or hydronephrosis. Blood flow is shown within the right renal hilum.  Left Kidney:  Length: 12.6 cm. Left kidney is normal in size and echogenicity without mass, cyst, stone, or hydronephrosis. Blood flow is shown within the left renal hilum.  Bladder:  Bladder appears normal. Prevoid bladder volume measured at 508 cc. Final images show no significant postvoid residual.  IMPRESSION: Normal renal ultrasound.   Electronically Signed   By: Franki Cabot M.D.   On: 07/06/2015 08:55     ASSESSMENT AND PLAN:  44 year old Caucasian male admitted for sepsis, weakness and diarrhea.  1. Sepsis: The patient met septic criteria on admission with tachycardi leukocytosis and diarrhea. 2 most recent  stool studies negative for c. Dif, ova and parasites, culture negative. Colon aspirate from colonoscopy pending. Blood cultures negative. Stool culture normal flora D/c zosyn  2. Severe Weakness: Likely secondary to sepsis, dehydration and hypokalemia.Continue to replace electrolytes and fluids. Continue physical therapy. -to rehab in 1-2 days . 3. Diarrhea:  - Initially hospitalized 6/22 through 7/7 for diarrhea thought to be due to Clostridium difficile - Gastroenterology following. Colonoscopy on 7/21. Has had no further diarrhea since the procedure. colonoscopy showing diffuse inflammation he has been started on Solu-Medrol--->switch to po prednsione. D/w dr Candace Cruise - C. difficile assay negative during this admission, stool culturenegative for potential pathogen, colonoscopy biopsy results pending. Pt will follow GI as ou pt  4. Alcohol withdrawal:  - No recent ativan needed.  5. Hip pain: sec to Avascular necrosis.  - Is been seen by orthopedics, Dr. Rudene Christians who is recommending OP Rehab  6. Acute renal failure:  - Likely ATN due to volume loss, also developing acidosis. - Nephrology has been consulted - On IV bicarbonate infusion - Renal ultrasound is normal, complement levels are pending, ANA is pending -creat trending down 2.05-->2.98-->2.89  7. Thrombocytopenia - Hold pharmacological DVT prophylaxis - No signs of bleeding  8. Anemia - Anemia panel ordered  9. Hypothyroidism - Has had persistently elevated TSH. Low-dose Synthroid started 7/22  7. DVT prophylaxis: SCDs 8. GI prophylaxis: protonix  CODE STATUS: Full  Case discussed with Care Management/Social Worker. Management plans discussed with the patient and  in agreement.  TOTAL TIME TAKING CARE OF THIS PATIENT: 25 minutes.  >50% time spent on counselling and coordination of care with pt. D/w Dr Candace Cruise  POSSIBLE D/C IN 1 DAYS, DEPENDING ON CLINICAL CONDITION.   Troy [name] M.D on 07/07/2015 at 1:19 PM  Between 7am  to 6pm - Pager - [phone]  After 6pm go to [email or web address] - password [email or web address] [facility]  [facility]  Office  [phone]  CC: Primary care physician; No PCP Per Patient

## 2015-07-07 NOTE — Progress Notes (Signed)
[facility]  ROUNDING NOTE   Subjective:  No further diarrhea. Feeling better. Renal function about the same. Acidosis better.    Objective:  Vital signs in last 24 hours:  Temp:  [97 F (36.1 C)-97.9 F (36.6 C)] 97.5 F (36.4 C) (07/23 1246) Pulse Rate:  [64-81] 81 (07/23 1246) Resp:  [18] 18 (07/23 1246) BP: (106-135)/(68-79) 106/74 mmHg (07/23 1246) SpO2:  [98 %-100 %] 100 % (07/23 1246) Weight:  [100.018 kg (220 lb 8 oz)] 100.018 kg (220 lb 8 oz) (07/23 0500)  Weight change:  Filed Weights   07/02/15 0110 07/03/15 0500 07/07/15 0500  Weight: 95.255 kg (210 lb) 92.035 kg (202 lb 14.4 oz) 100.018 kg (220 lb 8 oz)    Intake/Output: I/O last 3 completed shifts: In: 2043.8 [I.V.:2043.8] Out: 8466 [Urine:3575]   Intake/Output this shift:     Physical Exam: General: NAD, laying in bed.  Head: Normocephalic, atraumatic. Moist oral mucosal membranes  Eyes: Anicteric  Neck: Supple, trachea midline  Lungs:  Clear to auscultation normal effort  Heart: Regular rate and rhythm S1S2  Abdomen:  Soft, nontender, BS present  Extremities:  trace peripheral edema.  Neurologic: Nonfocal, moving all four extremities  Skin: No lesions       Basic Metabolic Panel:  Recent Labs Lab 07/01/15 2103 07/02/15 1354 07/03/15 0736 07/03/15 1005 07/04/15 0609 07/05/15 0625 07/06/15 0608 07/07/15 0446 07/07/15 1210  NA 137  --  140  --  139 139 139 140  --   K 3.0* 2.7* 2.9* 3.7 4.1 4.6 5.4* 5.1  --   CL 94*  --  108  --  108 113* 115* 113*  --   CO2 25  --  22  --  21* 19* 17* 20*  --   GLUCOSE 148*  --  102*  --  102* 87 184* 179*  --   BUN 16  --  8  --  9 11 14 20   --   CREATININE 0.83  --  0.91  --  2.05* 2.56* 2.98* 2.89* 2.98*  CALCIUM 7.9*  --  7.4*  --  7.6* 7.8* 8.1* 8.0*  --   MG 0.8* 2.2 1.3* 1.3* 1.5*  --   --   --   --     Liver Function Tests:  Recent Labs Lab 07/01/15 2103 07/05/15 0625 07/06/15 0608 07/07/15 0446  AST 60* 34 23 18   ALT 15* 12* 12* 10*  ALKPHOS 189* 110 112 110  BILITOT 2.8* 0.7 0.7 0.5  PROT 6.6 4.8* 5.2* 5.2*  ALBUMIN 3.1* 2.0* 2.2* 2.3*    Recent Labs Lab 07/01/15 2103  LIPASE 13*   No results for input(s): AMMONIA in the last 168 hours.  CBC:  Recent Labs Lab 07/01/15 2103 07/03/15 1005 07/05/15 0625 07/06/15 0608 07/07/15 0446  WBC 16.1* 6.7 4.9 2.8* 4.4  HGB 10.7* 8.4* 8.3* 8.5* 8.6*  HCT 32.3* 25.6* 25.3* 26.3* 26.2*  MCV 93.5 96.2 97.7 97.6 98.4  PLT 122* 50* 47* 56* 73*    Cardiac Enzymes:  Recent Labs Lab 07/01/15 2103  TROPONINI 0.03    BNP: Invalid input(s): POCBNP  CBG:  Recent Labs Lab 07/03/15 0731  GLUCAP 79    Microbiology: Results for orders placed or performed during the hospital encounter of 07/01/15  Culture, blood (routine x 2)     Status: None   Collection Time: 07/01/15 10:24 PM  Result Value Ref Range Status   Specimen Description BLOOD LEFT ARM  Final   Special Requests BOTTLES DRAWN AEROBIC AND ANAEROBIC 10CC  Final   Culture NO GROWTH 5 DAYS  Final   Report Status 07/06/2015 FINAL  Final  Culture, blood (routine x 2)     Status: None   Collection Time: 07/01/15 10:30 PM  Result Value Ref Range Status   Specimen Description BLOOD RIGHT ARM  Final   Special Requests BOTTLES DRAWN AEROBIC AND ANAEROBIC 10CC  Final   Culture NO GROWTH 5 DAYS  Final   Report Status 07/06/2015 FINAL  Final  C difficile quick scan w PCR reflex ([facility] only)     Status: None   Collection Time: 07/03/15  8:30 AM  Result Value Ref Range Status   C Diff antigen NEGATIVE  Final   C Diff toxin NEGATIVE  Final   C Diff interpretation Negative for C. difficile  Final  Stool culture     Status: None (Preliminary result)   Collection Time: 07/05/15  4:30 PM  Result Value Ref Range Status   Specimen Description STOOL  Final   Special Requests NONE  Final   Culture   Final    NO SALMONELLA OR SHIGELLA ISOLATED No Pathogenic E. coli detected NO CAMPYLOBACTER  DETECTED    Report Status PENDING  Incomplete  Culture, blood (routine x 2)     Status: None (Preliminary result)   Collection Time: 07/05/15  6:42 PM  Result Value Ref Range Status   Specimen Description BLOOD RIGHT ANTECUBITAL  Final   Special Requests BOTTLES DRAWN AEROBIC AND ANAEROBIC 10ML  Final   Culture NO GROWTH 2 DAYS  Final   Report Status PENDING  Incomplete  Culture, blood (routine x 2)     Status: None (Preliminary result)   Collection Time: 07/05/15  6:48 PM  Result Value Ref Range Status   Specimen Description BLOOD RHAND  Final   Special Requests BOTTLES DRAWN AEROBIC AND ANAEROBIC 1ML  Final   Culture NO GROWTH 2 DAYS  Final   Report Status PENDING  Incomplete    Coagulation Studies: No results for input(s): LABPROT, INR in the last 72 hours.  Urinalysis:  Recent Labs  07/05/15 1443  COLORURINE STRAW*  LABSPEC 1.005  PHURINE 5.0  GLUCOSEU NEGATIVE  HGBUR NEGATIVE  BILIRUBINUR NEGATIVE  KETONESUR NEGATIVE  PROTEINUR NEGATIVE  NITRITE NEGATIVE  LEUKOCYTESUR NEGATIVE      Imaging: US Renal  07/06/2015   CLINICAL DATA:  Renal failure.  EXAM: RENAL / URINARY TRACT ULTRASOUND COMPLETE  COMPARISON:  No previous renal ultrasound available for comparison. Comparison is made to an abdomen CT dated 07/03/2015.  FINDINGS: Right Kidney:  Length: 13.5 cm. Right kidney is normal in size and echogenicity without mass, cyst, stone, or hydronephrosis. Blood flow is shown within the right renal hilum.  Left Kidney:  Length: 12.6 cm. Left kidney is normal in size and echogenicity without mass, cyst, stone, or hydronephrosis. Blood flow is shown within the left renal hilum.  Bladder:  Bladder appears normal. Prevoid bladder volume measured at 508 cc. Final images show no significant postvoid residual.  IMPRESSION: Normal renal ultrasound.   Electronically Signed   By: Franki Cabot M.D.   On: 07/06/2015 08:55     Medications:   .  sodium bicarbonate  infusion 1000 mL 75  mL/hr at 07/06/15 1843   . folic acid  1 mg Oral Daily  . levothyroxine  25 mcg Oral QAC breakfast  . loperamide  2 mg Oral TID  . magnesium  oxide  200 mg Oral BID  . magnesium sulfate 1 - 4 g bolus IVPB  1 g Intravenous Once  . nystatin   Topical TID  . pantoprazole  40 mg Oral Daily  . [START ON 07/08/2015] predniSONE  40 mg Oral Q breakfast  . saccharomyces boulardii  250 mg Oral BID  . sodium chloride  3 mL Intravenous Q12H  . thiamine  100 mg Oral Daily   acetaminophen, HYDROcodone-acetaminophen, liver oil-zinc oxide, ondansetron **OR** ondansetron (ZOFRAN) IV, pregabalin, QUEtiapine  Assessment/ Plan:  44 y.o. male with a PMHx of hypertension, peripheral neuropathy, history of alcohol withdrawal seizures, pancytopenia, ascites, hepatitis C, hypothyroidism who was admitted to [facility] on 07/01/2015 for evaluation of weakness.   1. Acute renal failure, multifactorial with contributions from volume loss, elevated vancomycin level, and contrast exposure. 2. Hyperkalemia. 3. Metabolic acidosis. 4. Anemia unspecified. 5. Prolonged diarrhea.  Plan:   Renal function about the same, but good UOP noted.  K level improved to normal range. Acidosis also improved. Continue sodium bicarb infusion x one more day.   If renal function improves tomorrow, possible discharge.   Renal US report also reviewed.   Will follow with you.  LOS: 5 Amen Staszak 7/23/20162:41 PM

## 2015-07-08 LAB — STOOL CULTURE

## 2015-07-08 LAB — BASIC METABOLIC PANEL
ANION GAP: 6 (ref 5–15)
BUN: 28 mg/dL — AB (ref 6–20)
CO2: 23 mmol/L (ref 22–32)
Calcium: 7.9 mg/dL — ABNORMAL LOW (ref 8.9–10.3)
Chloride: 111 mmol/L (ref 101–111)
Creatinine, Ser: 2.95 mg/dL — ABNORMAL HIGH (ref 0.61–1.24)
GFR calc Af Amer: 28 mL/min — ABNORMAL LOW (ref >60)
GFR calc non Af Amer: 24 mL/min — ABNORMAL LOW (ref >60)
Glucose, Bld: 85 mg/dL (ref 65–99)
Potassium: 4.4 mmol/L (ref 3.5–5.1)
Sodium: 140 mmol/L (ref 135–145)

## 2015-07-08 NOTE — Progress Notes (Signed)
[facility]  ROUNDING NOTE   Subjective:  Cr about the same this AM.  No further diarrhea. Ate breakfast this AM. Good UOP noted.   Objective:  Vital signs in last 24 hours:  Temp:  [97.5 F (36.4 C)-98.2 F (36.8 C)] 98 F (36.7 C) (07/24 0719) Pulse Rate:  [73-94] 94 (07/24 0719) Resp:  [16-18] 16 (07/24 0719) BP: (97-139)/(66-79) 97/66 mmHg (07/24 0719) SpO2:  [98 %-100 %] 98 % (07/24 0719) Weight:  [99.202 kg (218 lb 11.2 oz)] 99.202 kg (218 lb 11.2 oz) (07/24 0445)  Weight change: -0.816 kg (-1 lb 12.8 oz) Filed Weights   07/03/15 0500 07/07/15 0500 07/08/15 0445  Weight: 92.035 kg (202 lb 14.4 oz) 100.018 kg (220 lb 8 oz) 99.202 kg (218 lb 11.2 oz)    Intake/Output: I/O last 3 completed shifts: In: 720 [P.O.:720] Out: 3495 [[id number]]   Intake/Output this shift:  Total I/O In: -  Out: 350 [Urine:350]  Physical Exam: General: NAD, laying in bed.  Head: Normocephalic, atraumatic. Moist oral mucosal membranes  Eyes: Anicteric  Neck: Supple, trachea midline  Lungs:  Clear to auscultation normal effort  Heart: Regular rate and rhythm S1S2  Abdomen:  Soft, nontender, BS present  Extremities:  trace peripheral edema.  Neurologic: Nonfocal, moving all four extremities  Skin: No lesions       Basic Metabolic Panel:  Recent Labs Lab 07/01/15 2103 07/02/15 1354 07/03/15 0736 07/03/15 1005 07/04/15 0609 07/05/15 0625 07/06/15 0608 07/07/15 0446 07/07/15 1210 07/08/15 0353  NA 137  --  140  --  139 139 139 140  --  140  K 3.0* 2.7* 2.9* 3.7 4.1 4.6 5.4* 5.1  --  4.4  CL 94*  --  108  --  108 113* 115* 113*  --  111  CO2 25  --  22  --  21* 19* 17* 20*  --  23  GLUCOSE 148*  --  102*  --  102* 87 184* 179*  --  85  BUN 16  --  8  --  9 11 14 20   --  28*  CREATININE 0.83  --  0.91  --  2.05* 2.56* 2.98* 2.89* 2.98* 2.95*  CALCIUM 7.9*  --  7.4*  --  7.6* 7.8* 8.1* 8.0*  --  7.9*  MG 0.8* 2.2 1.3* 1.3* 1.5*  --   --   --   --   --      Liver Function Tests:  Recent Labs Lab 07/01/15 2103 07/05/15 0625 07/06/15 0608 07/07/15 0446  AST 60* 34 23 18  ALT 15* 12* 12* 10*  ALKPHOS 189* 110 112 110  BILITOT 2.8* 0.7 0.7 0.5  PROT 6.6 4.8* 5.2* 5.2*  ALBUMIN 3.1* 2.0* 2.2* 2.3*    Recent Labs Lab 07/01/15 2103  LIPASE 13*   No results for input(s): AMMONIA in the last 168 hours.  CBC:  Recent Labs Lab 07/01/15 2103 07/03/15 1005 07/05/15 0625 07/06/15 0608 07/07/15 0446  WBC 16.1* 6.7 4.9 2.8* 4.4  HGB 10.7* 8.4* 8.3* 8.5* 8.6*  HCT 32.3* 25.6* 25.3* 26.3* 26.2*  MCV 93.5 96.2 97.7 97.6 98.4  PLT 122* 50* 47* 56* 73*    Cardiac Enzymes:  Recent Labs Lab 07/01/15 2103  TROPONINI 0.03    BNP: Invalid input(s): POCBNP  CBG:  Recent Labs Lab 07/03/15 0731  GLUCAP 26    Microbiology: Results for orders placed or performed during the hospital encounter of 07/01/15  Culture,  blood (routine x 2)     Status: None   Collection Time: 07/01/15 10:24 PM  Result Value Ref Range Status   Specimen Description BLOOD LEFT ARM  Final   Special Requests BOTTLES DRAWN AEROBIC AND ANAEROBIC 10CC  Final   Culture NO GROWTH 5 DAYS  Final   Report Status 07/06/2015 FINAL  Final  Culture, blood (routine x 2)     Status: None   Collection Time: 07/01/15 10:30 PM  Result Value Ref Range Status   Specimen Description BLOOD RIGHT ARM  Final   Special Requests BOTTLES DRAWN AEROBIC AND ANAEROBIC 10CC  Final   Culture NO GROWTH 5 DAYS  Final   Report Status 07/06/2015 FINAL  Final  C difficile quick scan w PCR reflex ([facility] only)     Status: None   Collection Time: 07/03/15  8:30 AM  Result Value Ref Range Status   C Diff antigen NEGATIVE  Final   C Diff toxin NEGATIVE  Final   C Diff interpretation Negative for C. difficile  Final  Stool culture     Status: None (Preliminary result)   Collection Time: 07/05/15  4:30 PM  Result Value Ref Range Status   Specimen Description STOOL  Final   Special  Requests NONE  Final   Culture   Final    NO SALMONELLA OR SHIGELLA ISOLATED No Pathogenic E. coli detected NO CAMPYLOBACTER DETECTED    Report Status PENDING  Incomplete  Culture, blood (routine x 2)     Status: None (Preliminary result)   Collection Time: 07/05/15  6:42 PM  Result Value Ref Range Status   Specimen Description BLOOD RIGHT ANTECUBITAL  Final   Special Requests BOTTLES DRAWN AEROBIC AND ANAEROBIC 10ML  Final   Culture NO GROWTH 3 DAYS  Final   Report Status PENDING  Incomplete  Culture, blood (routine x 2)     Status: None (Preliminary result)   Collection Time: 07/05/15  6:48 PM  Result Value Ref Range Status   Specimen Description BLOOD RHAND  Final   Special Requests BOTTLES DRAWN AEROBIC AND ANAEROBIC 1ML  Final   Culture NO GROWTH 3 DAYS  Final   Report Status PENDING  Incomplete    Coagulation Studies: No results for input(s): LABPROT, INR in the last 72 hours.  Urinalysis:  Recent Labs  07/05/15 1443  COLORURINE STRAW*  LABSPEC 1.005  PHURINE 5.0  GLUCOSEU NEGATIVE  HGBUR NEGATIVE  BILIRUBINUR NEGATIVE  KETONESUR NEGATIVE  PROTEINUR NEGATIVE  NITRITE NEGATIVE  LEUKOCYTESUR NEGATIVE      Imaging: No results found.   Medications:     . folic acid  1 mg Oral Daily  . levothyroxine  25 mcg Oral QAC breakfast  . loperamide  2 mg Oral TID  . magnesium oxide  200 mg Oral BID  . magnesium sulfate 1 - 4 g bolus IVPB  1 g Intravenous Once  . nystatin   Topical TID  . pantoprazole  40 mg Oral Daily  . predniSONE  40 mg Oral Q breakfast  . saccharomyces boulardii  250 mg Oral BID  . sodium chloride  3 mL Intravenous Q12H  . thiamine  100 mg Oral Daily   acetaminophen, HYDROcodone-acetaminophen, liver oil-zinc oxide, ondansetron **OR** ondansetron (ZOFRAN) IV, pregabalin, QUEtiapine  Assessment/ Plan:  43 y.o. male with a PMHx of hypertension, peripheral neuropathy, history of alcohol withdrawal seizures, pancytopenia, ascites, hepatitis C,  hypothyroidism who was admitted to [facility] on 07/01/2015 for evaluation of weakness.  1. Acute renal failure, multifactorial with contributions from volume loss, elevated vancomycin level, and contrast exposure. 2. Hyperkalemia. 3. Metabolic acidosis. 4. Anemia unspecified. 5. Prolonged diarrhea.  Plan:   Kidney function about the same today. As above renal failure is multifactorial. Off IVFs now. Encouraged pt to drink plenty of fluid today as he has good UOP, but he's likely unable to concentrate urine given  Tubular dysfunction. Discussed with Dr. Posey Pronto. We would recommend consideration for discharge tomorrow with close outpt follow up. For now follow UOP and Cr trend.   LOS: 6 Tayah Idrovo 7/24/20169:54 AM

## 2015-07-08 NOTE — Clinical Social Work Note (Signed)
CSW was updated by MD that pt will likely discharge tomorrow. [employer] Troy [name] is valid through Monday, MD is aware of this. CSW will continue to follow.   Darden Dates, MSW, LCSW Clinical Social Worker  [phone]

## 2015-07-08 NOTE — Consult Note (Signed)
GI Inpatient Follow-up Note  Patient Identification: Troy [name] is a 44 y.o. male  Subjective: No more diarrhea. Now on po prednisone.  Scheduled Inpatient Medications:  . folic acid  1 mg Oral Daily  . levothyroxine  25 mcg Oral QAC breakfast  . loperamide  2 mg Oral TID  . magnesium oxide  200 mg Oral BID  . magnesium sulfate 1 - 4 g bolus IVPB  1 g Intravenous Once  . nystatin   Topical TID  . pantoprazole  40 mg Oral Daily  . predniSONE  40 mg Oral Q breakfast  . saccharomyces boulardii  250 mg Oral BID  . sodium chloride  3 mL Intravenous Q12H  . thiamine  100 mg Oral Daily    Continuous Inpatient Infusions:     PRN Inpatient Medications:  acetaminophen, HYDROcodone-acetaminophen, liver oil-zinc oxide, ondansetron **OR** ondansetron (ZOFRAN) IV, pregabalin, QUEtiapine  Review of Systems: Constitutional: Weight is stable.  Eyes: No changes in vision. ENT: No oral lesions, sore throat.  GI: see HPI.  Heme/Lymph: No easy bruising.  CV: No chest pain.  GU: No hematuria.  Integumentary: No rashes.  Neuro: No headaches.  Psych: No depression/anxiety.  Endocrine: No heat/cold intolerance.  Allergic/Immunologic: No urticaria.  Resp: No cough, SOB.  Musculoskeletal: No joint swelling.    Physical Examination: BP 97/66 mmHg  Pulse 94  Temp(Src) 98 F (36.7 C) (Oral)  Resp 16  Ht 5\' 8"  (1.727 m)  Wt 99.202 kg (218 lb 11.2 oz)  BMI 33.26 kg/m2  SpO2 98% Gen: NAD, alert and oriented x 4 HEENT: PEERLA, EOMI, Neck: supple, no JVD or thyromegaly Chest: CTA bilaterally, no wheezes, crackles, or other adventitious sounds CV: RRR, no m/g/c/r Abd: soft, NT, ND, +BS in all four quadrants; no HSM, guarding, ridigity, or rebound tenderness Ext: no edema, well perfused with 2+ pulses, Skin: no rash or lesions noted Lymph: no LAD  Data: Lab Results  Component Value Date   WBC 4.4 07/07/2015   HGB 8.6* 07/07/2015   HCT 26.2* 07/07/2015   MCV 98.4 07/07/2015   PLT  73* 07/07/2015    Recent Labs Lab 07/05/15 0625 07/06/15 0608 07/07/15 0446  HGB 8.3* 8.5* 8.6*   Lab Results  Component Value Date   NA 140 07/08/2015   K 4.4 07/08/2015   CL 111 07/08/2015   CO2 23 07/08/2015   BUN 28* 07/08/2015   CREATININE 2.95* 07/08/2015   Lab Results  Component Value Date   ALT 10* 07/07/2015   AST 18 07/07/2015   ALKPHOS 110 07/07/2015   BILITOT 0.5 07/07/2015   No results for input(s): APTT, INR, PTT in the last 168 hours. Assessment/Plan: Mr. Clutter is a 44 y.o. male with probable IBD. Doing much better. Renal function improving as well.   Recommendations: Probable discharge tomorrow. Dr. Rayann Heman to follow pt. Thanks. Please call with questions or concerns.  Krystianna Soth, Lupita Dawn, MD

## 2015-07-08 NOTE — Progress Notes (Signed)
[place] [facility] [place] NAME: Troy [name]    MR#:  [id number]  DATE OF BIRTH:  1971/10/03  SUBJECTIVE:  Feeling ok. Eating well no diarrhea  REVIEW OF SYSTEMS:   Review of Systems  Constitutional: Negative for fever, chills and weight loss.  HENT: Negative for ear discharge, ear pain and nosebleeds.   Eyes: Negative for blurred vision, pain and discharge.  Respiratory: Negative for sputum production, shortness of breath, wheezing and stridor.   Cardiovascular: Negative for chest pain, palpitations, orthopnea and PND.  Gastrointestinal: Negative for nausea, vomiting, abdominal pain and diarrhea.  Genitourinary: Negative for urgency and frequency.  Musculoskeletal: Negative for back pain and joint pain.  Neurological: Negative for sensory change, speech change, focal weakness and weakness.  Psychiatric/Behavioral: Negative for depression. The patient is not nervous/anxious.   All other systems reviewed and are negative.  Tolerating Diet:yes Tolerating PT: yes rec STR  DRUG ALLERGIES:  No Known Allergies  VITALS:  Blood pressure 97/66, pulse 94, temperature 98 F (36.7 C), temperature source Oral, resp. rate 16, height _0  (1.727 m), weight 99.202 kg (218 lb 11.2 oz), SpO2 98 %.  PHYSICAL EXAMINATION:   Physical Exam  GENERAL:  44 y.o.-year-old patient lying in the bed with no acute distress.  EYES: Pupils equal, round, reactive to light and accommodation. No scleral icterus. Extraocular muscles intact.  HEENT: Head atraumatic, normocephalic. Oropharynx and nasopharynx clear.  NECK:  Supple, no jugular venous distention. No thyroid enlargement, no tenderness.  LUNGS: Normal breath sounds bilaterally, no wheezing, rales, rhonchi. No use of accessory muscles of respiration.  CARDIOVASCULAR: S1, S2 normal. No murmurs, rubs, or gallops.  ABDOMEN: Soft, nontender, nondistended. Bowel sounds present. No organomegaly or mass.   EXTREMITIES: No cyanosis, clubbing or edema b/l.    NEUROLOGIC: Cranial nerves II through XII are intact. No focal Motor or sensory deficits b/l.   PSYCHIATRIC: The patient is alert and oriented x 3.  SKIN: No obvious rash, lesion, or ulcer.    LABORATORY PANEL:   CBC  Recent Labs Lab 07/07/15 0446  WBC 4.4  HGB 8.6*  HCT 26.2*  PLT 73*    Chemistries   Recent Labs Lab 07/04/15 0609  07/07/15 0446  07/08/15 0353  NA 139  < > 140  --  140  K 4.1  < > 5.1  --  4.4  CL 108  < > 113*  --  111  CO2 21*  < > 20*  --  23  GLUCOSE 102*  < > 179*  --  85  BUN 9  < > 20  --  28*  CREATININE 2.05*  < > 2.89*  < > 2.95*  CALCIUM 7.6*  < > 8.0*  --  7.9*  MG 1.5*  --   --   --   --   AST  --   < > 18  --   --   ALT  --   < > 10*  --   --   ALKPHOS  --   < > 110  --   --   BILITOT  --   < > 0.5  --   --   < > = values in this interval not displayed.  Cardiac Enzymes  Recent Labs Lab 07/01/15 2103  TROPONINI 0.03    RADIOLOGY:  No results found.   ASSESSMENT AND PLAN:  44 year old Caucasian male admitted for sepsis, weakness and diarrhea.  1.  Sepsis: The patient met septic criteria on admission with tachycardi leukocytosis and diarrhea. 2 most recent stool studies negative for c. Dif, ova and parasites, culture negative. Colon aspirate from colonoscopy pending. Blood cultures negative. Stool culture normal flora D/c zosyn  2. Severe Weakness: Likely secondary to sepsis, dehydration and hypokalemia.Continue to replace electrolytes and fluids. Continue physical therapy. -to rehab in 1-2 days . 3. Diarrhea:  - Initially hospitalized 6/22 through 7/7 for diarrhea thought to be due to Clostridium difficile - Gastroenterology following. Colonoscopy on 7/21. Has had no further diarrhea since the procedure. colonoscopy showing diffuse inflammation he has been started on Solu-Medrol--->switch to po prednsione. D/w dr Candace Cruise - C. difficile assay negative during this admission,  stool culturenegative for potential pathogen, colonoscopy biopsy results pending. Pt will follow GI as ou pt  4. Alcohol withdrawal:  - No recent ativan needed.  5. Hip pain: sec to Avascular necrosis.  - Is been seen by orthopedics, Dr. Rudene Christians who is recommending OP Rehab  6. Acute renal failure:  - Likely ATN due to volume loss, high vanc levels and contrast exposurealso developing acidosis. - Nephrology has been consulted - recvd IV bicarbonate infusion - Renal ultrasound is normal, complement levels are pending, ANA is pending -creat trending down 2.05-->2.98-->2.89-->2.9-->2.8 -good uop  7. Thrombocytopenia - Hold pharmacological DVT prophylaxis - No signs of bleeding  8. Anemia - Anemia panel ordered  9. Hypothyroidism - Has had persistently elevated TSH. Low-dose Synthroid started 7/22  7. DVT prophylaxis: SCDs 8. GI prophylaxis: protonix  CODE STATUS: Full  Case discussed with Care Management/Social Worker. Management plans discussed with the patient and  in agreement.Dr Holley Raring recommends one more day to watch labs  TOTAL TIME TAKING CARE OF THIS PATIENT: 25 minutes.  >50% time spent on counselling and coordination of care with pt. D/w Dr Holley Raring POSSIBLE D/C IN 1 DAYS, DEPENDING ON CLINICAL CONDITION.   Abigaelle Verley M.D on 07/08/2015 at 10:34 AM  Between 7am to 6pm - Pager - [phone]  After 6pm go to [email or web address] - password [email or web address] [facility]  [facility]  Office  [phone]  CC: Primary care physician; No PCP Per Patient

## 2015-07-09 ENCOUNTER — Encounter: Payer: Self-pay | Admitting: Gastroenterology

## 2015-07-09 LAB — BASIC METABOLIC PANEL
Anion gap: 8 (ref 5–15)
BUN: 32 mg/dL — ABNORMAL HIGH (ref 6–20)
CHLORIDE: 110 mmol/L (ref 101–111)
CO2: 23 mmol/L (ref 22–32)
CREATININE: 2.7 mg/dL — AB (ref 0.61–1.24)
Calcium: 7.7 mg/dL — ABNORMAL LOW (ref 8.9–10.3)
GFR calc Af Amer: 31 mL/min — ABNORMAL LOW (ref >60)
GFR calc non Af Amer: 27 mL/min — ABNORMAL LOW (ref >60)
GLUCOSE: 102 mg/dL — AB (ref 65–99)
Potassium: 4 mmol/L (ref 3.5–5.1)
Sodium: 141 mmol/L (ref 135–145)

## 2015-07-09 MED ORDER — MAGNESIUM OXIDE 400 (241.3 MG) MG PO TABS: 400.0000 mg | ORAL_TABLET | Freq: Every day | ORAL | Status: AC

## 2015-07-09 MED ORDER — LEVOTHYROXINE SODIUM 25 MCG PO TABS: 25.0000 ug | ORAL_TABLET | Freq: Every day | ORAL | Status: AC

## 2015-07-09 MED ORDER — PREDNISONE 20 MG PO TABS: 40.0000 mg | ORAL_TABLET | Freq: Every day | ORAL | Status: AC

## 2015-07-09 MED ORDER — HYDROCODONE-ACETAMINOPHEN 5-325 MG PO TABS: 1.0000 | ORAL_TABLET | Freq: Three times a day (TID) | ORAL | Status: AC | PRN

## 2015-07-09 NOTE — Clinical Social Work Placement (Signed)
   CLINICAL SOCIAL WORK PLACEMENT  NOTE  Date:  07/09/2015  Patient Details  Name: Troy [name] MRN: [id number] Date of Birth: [birth year]  Clinical Social Work is seeking post-discharge placement for this patient at the [place] level of care (*CSW will initial, date and re-position this form in  chart as items are completed):  Yes   Patient/family provided with [place] Work Department's list of facilities offering this level of care within the geographic area requested by the patient (or if unable, by the patient's family).  Yes   Patient/family informed of their freedom to choose among providers that offer the needed level of care, that participate in Medicare, Medicaid or managed care program needed by the patient, have an available bed and are willing to accept the patient.  Yes   Patient/family informed of [facility]'s ownership interest in [facility] and [facility], as well as of the fact that they are under no obligation to receive care at these facilities.  PASRR submitted to EDS on       PASRR number received on       Existing PASRR number confirmed on 07/03/15     FL2 transmitted to all facilities in geographic area requested by pt/family on 07/03/15     FL2 transmitted to all facilities within larger geographic area on       Patient informed that his/her managed care company has contracts with or will negotiate with certain facilities, including the following:        Yes   Patient/family informed of bed offers received.  Patient chooses bed at  [facility])     Physician recommends and patient chooses bed at      Patient to be transferred to  [employer] [facility]) on 07/09/15.  Patient to be transferred to facility by  (Patient's cousin will transport in private vehicle. )     Patient family notified on 07/09/15 of transfer.  Name of family member notified:   (Per patient his sister and cousin are aware. )      PHYSICIAN       Additional Comment:    _______________________________________________ Loralyn Freshwater, LCSW 07/09/2015, 12:13 PM

## 2015-07-09 NOTE — Discharge Summary (Signed)
[facility] NAME: Troy [name]    MR#:  [id number]  DATE OF BIRTH:  [birth year]  DATE OF ADMISSION:  07/01/2015 ADMITTING PHYSICIAN: Harrie Foreman, MD  DATE OF DISCHARGE: 07/09/15  PRIMARY CARE PHYSICIAN: No PCP Per Patient    ADMISSION DIAGNOSIS:  Hip injury, right, initial encounter [S79.911A] Non-intractable vomiting with nausea, vomiting of unspecified type [R11.2]  DISCHARGE DIAGNOSIS:  Diffuse Colitis Sepsis -resolved Acute renal failure-improving  SECONDARY DIAGNOSIS:   Past Medical History  Diagnosis Date  . Liver damage   . Hypertension   . Neuropathy   . Seizures     complicated ETOH withdrawal  . Pancytopenia, acquired   . Ascites due to alcoholic cirrhosis   . DTs (delirium tremens)   . Hypothyroidism   . Hepatitis C     HOSPITAL COURSE:   44 year old Caucasian male admitted for sepsis, weakness and diarrhea.  1. Sepsis: The patient met septic criteria on admission with tachycardi leukocytosis and diarrhea. 2 most recent stool studies negative for c. Dif, ova and parasites, culture negative. Colon aspirate from colonoscopy pending. Blood cultures negative. Stool culture normal flora D/c zosyn  2. Severe Weakness: Likely secondary to sepsis, dehydration and hypokalemia.Continue to replace electrolytes and fluids. Continue physical therapy. -to rehab in 1-2 days . 3. Diarrhea:  - Initially hospitalized 6/22 through 7/7 for diarrhea thought to be due to Clostridium difficile - Gastroenterology following. Colonoscopy on 7/21. Has had no further diarrhea since the procedure. colonoscopy showing diffuse inflammation he has been started on Solu-Medrol--->switch to po prednsione. D/w dr Candace Cruise - C. difficile assay negative during this admission, stool culturenegative for potential pathogen, colonoscopy biopsy results pending. Pt will follow GI as ou pt  4. Alcohol withdrawal:  - No recent ativan  needed.  5. Hip pain: sec to Avascular necrosis.  - Is been seen by orthopedics, Dr. Rudene Christians who is recommending OP Rehab  6. Acute renal failure:  - Likely ATN due to volume loss, high vanc levels and contrast exposurealso developing acidosis. - Nephrology has been consulted - recvd IV bicarbonate infusion - Renal ultrasound is normal, complement levels are pending, ANA is pending -creat trending down 2.05-->2.98-->2.89-->2.9-->2.8--->2.70 -good uop  7. Thrombocytopenia - Hold pharmacological DVT prophylaxis - No signs of bleeding  8. Anemia - Anemia panel ordered  9. Hypothyroidism - Has had persistently elevated TSH. Low-dose Synthroid started 7/22  7. DVT prophylaxis: SCDs 8. GI prophylaxis: protonix  Overall at baseline Pt advised to f/u with GI and nephrology as outpt D/c to [facility] today  CODE STATUS: Full  DISCHARGE CONDITIONS:   fair  CONSULTS OBTAINED:  Treatment Team:  Hessie Knows, MD Lollie Sails, MD Josefine Class, MD Munsoor Holley Raring, MD  DRUG ALLERGIES:  No Known Allergies  DISCHARGE MEDICATIONS:   Current Discharge Medication List    START taking these medications   Details  HYDROcodone-acetaminophen (NORCO/VICODIN) 5-325 MG per tablet Take 1 tablet by mouth 3 (three) times daily as needed for moderate pain. Qty: 30 tablet, Refills: 0    levothyroxine (SYNTHROID, LEVOTHROID) 25 MCG tablet Take 1 tablet (25 mcg total) by mouth daily before breakfast. Qty: 30 tablet, Refills: 0    magnesium oxide (MAG-OX) 400 (241.3 MG) MG tablet Take 1 tablet (400 mg total) by mouth daily. Qty: 30 tablet, Refills: 0    predniSONE (DELTASONE) 20 MG tablet Take 2 tablets (40 mg total) by mouth daily with breakfast. Qty: 30 tablet, Refills:  0      CONTINUE these medications which have NOT CHANGED   Details  pregabalin (LYRICA) 150 MG capsule Take 150 mg by mouth 2 (two) times daily as needed (for nerve pain).    QUEtiapine (SEROQUEL) 50 MG tablet  Take 50 mg by mouth at bedtime as needed (for sleep).    folic acid (FOLVITE) 1 MG tablet Take 1 tablet (1 mg total) by mouth daily. Qty: 30 tablet, Refills: 0    furosemide (LASIX) 40 MG tablet Take 1 tablet (40 mg total) by mouth daily. Qty: 30 tablet, Refills: 0    loperamide (IMODIUM) 2 MG capsule Take 1 capsule (2 mg total) by mouth 3 (three) times daily. Qty: 30 capsule, Refills: 0    magnesium 30 MG tablet Take 1 tablet (30 mg total) by mouth 2 (two) times daily. Qty: 14 tablet, Refills: 0    nadolol (CORGARD) 40 MG tablet Take 1 tablet (40 mg total) by mouth daily. Qty: 30 tablet, Refills: 0    pantoprazole (PROTONIX) 40 MG tablet Take 1 tablet (40 mg total) by mouth daily. Qty: 60 tablet, Refills: 0    potassium chloride SA (K-DUR,KLOR-CON) 20 MEQ tablet Take 2 tablets (40 mEq total) by mouth 2 (two) times daily. Qty: 20 tablet, Refills: 0    saccharomyces boulardii (FLORASTOR) 250 MG capsule Take 1 capsule (250 mg total) by mouth 2 (two) times daily. Qty: 20 capsule, Refills: 0    thiamine 100 MG tablet Take 1 tablet (100 mg total) by mouth daily. Qty: 30 tablet, Refills: 0    zinc oxide 20 % ointment Apply topically 2 (two) times daily as needed for irritation. Apply to button Qty: 56.7 g, Refills: 0      STOP taking these medications     fidaxomicin (DIFICID) 200 MG TABS tablet         If you experience worsening of your admission symptoms, develop shortness of breath, life threatening emergency, suicidal or homicidal thoughts you must seek medical attention immediately by calling 911 or calling your MD immediately  if symptoms less severe.  You Must read complete instructions/literature along with all the possible adverse reactions/side effects for all the Medicines you take and that have been prescribed to you. Take any new Medicines after you have completely understood and accept all the possible adverse reactions/side effects.   Please note  You were  cared for by a hospitalist during your hospital stay. If you have any questions about your discharge medications or the care you received while you were in the hospital after you are discharged, you can call the unit and asked to speak with the hospitalist on call if the hospitalist that took care of you is not available. Once you are discharged, your primary care physician will handle any further medical issues. Please note that NO REFILLS for any discharge medications will be authorized once you are discharged, as it is imperative that you return to your primary care physician (or establish a relationship with a primary care physician if you do not have one) for your aftercare needs so that they can reassess your need for medications and monitor your lab values. Today   SUBJECTIVE   Doing w ell  VITAL SIGNS:  Blood pressure 121/76, pulse 75, temperature 98.3 F (36.8 C), temperature source Oral, resp. rate 18, height 5' 8"  (1.727 m), weight 99.565 kg (219 lb 8 oz), SpO2 100 %.  I/O:   Intake/Output Summary (Last 24 hours) at 07/09/15 [phone]  Last data filed at 07/09/15 0753  Gross per 24 hour  Intake    480 ml  Output   2425 ml  Net  -1945 ml    PHYSICAL EXAMINATION:  GENERAL:  44 y.o.-year-old patient lying in the bed with no acute distress.  EYES: Pupils equal, round, reactive to light and accommodation. No scleral icterus. Extraocular muscles intact.  HEENT: Head atraumatic, normocephalic. Oropharynx and nasopharynx clear.  NECK:  Supple, no jugular venous distention. No thyroid enlargement, no tenderness.  LUNGS: Normal breath sounds bilaterally, no wheezing, rales,rhonchi or crepitation. No use of accessory muscles of respiration.  CARDIOVASCULAR: S1, S2 normal. No murmurs, rubs, or gallops.  ABDOMEN: Soft, non-tender, non-distended. Bowel sounds present. No organomegaly or mass.  EXTREMITIES: No pedal edema, cyanosis, or clubbing.  NEUROLOGIC: Cranial nerves II through XII are  intact. Muscle strength 5/5 in all extremities. Sensation intact. Gait not checked.  PSYCHIATRIC: The patient is alert and oriented x 3.  SKIN: No obvious rash, lesion, or ulcer.   DATA REVIEW:   CBC   Recent Labs Lab 07/07/15 0446  WBC 4.4  HGB 8.6*  HCT 26.2*  PLT 73*    Chemistries   Recent Labs Lab 07/04/15 0609  07/07/15 0446  07/09/15 0414  NA 139  < > 140  < > 141  K 4.1  < > 5.1  < > 4.0  CL 108  < > 113*  < > 110  CO2 21*  < > 20*  < > 23  GLUCOSE 102*  < > 179*  < > 102*  BUN 9  < > 20  < > 32*  CREATININE 2.05*  < > 2.89*  < > 2.70*  CALCIUM 7.6*  < > 8.0*  < > 7.7*  MG 1.5*  --   --   --   --   AST  --   < > 18  --   --   ALT  --   < > 10*  --   --   ALKPHOS  --   < > 110  --   --   BILITOT  --   < > 0.5  --   --   < > = values in this interval not displayed.  Microbiology Results   Recent Results (from the past 240 hour(s))  Culture, blood (routine x 2)     Status: None   Collection Time: 07/01/15 10:24 PM  Result Value Ref Range Status   Specimen Description BLOOD LEFT ARM  Final   Special Requests BOTTLES DRAWN AEROBIC AND ANAEROBIC 10CC  Final   Culture NO GROWTH 5 DAYS  Final   Report Status 07/06/2015 FINAL  Final  Culture, blood (routine x 2)     Status: None   Collection Time: 07/01/15 10:30 PM  Result Value Ref Range Status   Specimen Description BLOOD RIGHT ARM  Final   Special Requests BOTTLES DRAWN AEROBIC AND ANAEROBIC 10CC  Final   Culture NO GROWTH 5 DAYS  Final   Report Status 07/06/2015 FINAL  Final  C difficile quick scan w PCR reflex ([facility] only)     Status: None   Collection Time: 07/03/15  8:30 AM  Result Value Ref Range Status   C Diff antigen NEGATIVE  Final   C Diff toxin NEGATIVE  Final   C Diff interpretation Negative for C. difficile  Final  Stool culture     Status: None   Collection Time: 07/05/15  4:30  PM  Result Value Ref Range Status   Specimen Description STOOL  Final   Special Requests NONE  Final    Culture   Final    NO SALMONELLA OR SHIGELLA ISOLATED No Pathogenic E. coli detected NO CAMPYLOBACTER DETECTED    Report Status 07/08/2015 FINAL  Final  Culture, blood (routine x 2)     Status: None (Preliminary result)   Collection Time: 07/05/15  6:42 PM  Result Value Ref Range Status   Specimen Description BLOOD RIGHT ANTECUBITAL  Final   Special Requests BOTTLES DRAWN AEROBIC AND ANAEROBIC 10ML  Final   Culture NO GROWTH 3 DAYS  Final   Report Status PENDING  Incomplete  Culture, blood (routine x 2)     Status: None (Preliminary result)   Collection Time: 07/05/15  6:48 PM  Result Value Ref Range Status   Specimen Description BLOOD RHAND  Final   Special Requests BOTTLES DRAWN AEROBIC AND ANAEROBIC 1ML  Final   Culture NO GROWTH 3 DAYS  Final   Report Status PENDING  Incomplete     Management plans discussed with the patient and  in agreement.  CODE STATUS:     Code Status Orders        Start     Ordered   07/02/15 0123  Full code   Continuous     07/02/15 0122      TOTAL TIME TAKING CARE OF THIS PATIENT: 40 minutes.    Tyrian Peart M.D on 07/09/2015 at 8:03 AM  Between 7am to 6pm - Pager - [phone] After 6pm go to [email or web address] - password [email or web address] [facility]  [facility]  Office  [phone]  CC: Primary care physician; No PCP Per Patient

## 2015-07-09 NOTE — Clinical Social Work Placement (Signed)
   CLINICAL SOCIAL WORK PLACEMENT  NOTE  Date:  07/09/2015  Patient Details  Name: Troy [name] MRN: [id number] Date of Birth: [birth year]  Clinical Social Work is seeking post-discharge placement for this patient at the [place] level of care (*CSW will initial, date and re-position this form in  chart as items are completed):  Yes   Patient/family provided with [place] Work Department's list of facilities offering this level of care within the geographic area requested by the patient (or if unable, by the patient's family).  Yes   Patient/family informed of their freedom to choose among providers that offer the needed level of care, that participate in Medicare, Medicaid or managed care program needed by the patient, have an available bed and are willing to accept the patient.  Yes   Patient/family informed of [facility]'s ownership interest in [facility] and [facility], as well as of the fact that they are under no obligation to receive care at these facilities.  PASRR submitted to EDS on       PASRR number received on       Existing PASRR number confirmed on 07/03/15     FL2 transmitted to all facilities in geographic area requested by pt/family on 07/03/15     FL2 transmitted to all facilities within larger geographic area on       Patient informed that his/her managed care company has contracts with or will negotiate with certain facilities, including the following:        Yes   Patient/family informed of bed offers received.  Patient chooses bed at  [facility])     Physician recommends and patient chooses bed at      Patient to be transferred to  [facility]) on 07/09/15.  Patient to be transferred to facility by  [facility] )     Patient family notified on 07/09/15 of transfer.  Name of family member notified:   (Per patient his sister and cousin are aware. )     PHYSICIAN       Additional  Comment:    _______________________________________________ Loralyn Freshwater, LCSW 07/09/2015, 9:43 AM

## 2015-07-09 NOTE — Progress Notes (Addendum)
Patient is medically stable for D/C to [facility] today. Per [facility] admissions coordinator at [facility] patient is going to room 92. RN will call report and arrange EMS for transport if patient's cousin can't pick up him. [employer] authorization has been received. CSW left voicemail for [place] making her aware of D/C. CSW prepared D/C packet and sent D/C Summary and follow up appointments to [facility] via [facility]. Patient is aware of above. Please reconsult if future social work needs arise. CSW signing off.   Per RN patient is refusing EMS and reported that his cousin will provide transport.   Blima Rich, [place] [phone]

## 2015-07-09 NOTE — Progress Notes (Signed)
Discharge note:  Patient is discharged to [facility], report called to nurse Koleen Nimrod at facility. Patient says his cousin is coming to pick him up but has not called him back yet and is not sure of what time he will be here. I offered EMS as another option but patient is refusing to use EMS, states he will get his cousin to take him. Still have yet to hear back from his cousin though.

## 2015-07-10 LAB — CULTURE, BLOOD (ROUTINE X 2)
CULTURE: NO GROWTH
Culture: NO GROWTH

## 2015-07-10 LAB — SURGICAL PATHOLOGY

## 2015-07-10 NOTE — Progress Notes (Signed)
Clinical Education officer, museum (CSW) received call from [facility] patient's [employer] Case Freight forwarder today. Per Kenney Houseman patient's authorization has been approved for 7 days and the next review date is Monday August 1st. CSW contacted [facility] admissions coordinator at [facility] and made her aware of above.   Blima Rich, [place] [phone]

## 2015-07-12 LAB — O&P RESULT

## 2015-08-03 LAB — OVA + PARASITE EXAM

## 2015-08-20 ENCOUNTER — Encounter: Payer: Self-pay | Admitting: *Deleted

## 2015-08-20 ENCOUNTER — Inpatient Hospital Stay
Admission: EM | Admit: 2015-08-20 | Discharge: 2015-08-29 | DRG: 871 | Disposition: A | Discharge status: Home / Self Care | Payer: 59 | Attending: Internal Medicine | Admitting: Internal Medicine

## 2015-08-20 ENCOUNTER — Emergency Department: Payer: 59

## 2015-08-20 DIAGNOSIS — K219 Gastro-esophageal reflux disease without esophagitis: Secondary | ICD-10-CM | POA: Diagnosis present

## 2015-08-20 DIAGNOSIS — B9620 Unspecified Escherichia coli [E. coli] as the cause of diseases classified elsewhere: Secondary | ICD-10-CM | POA: Diagnosis present

## 2015-08-20 DIAGNOSIS — K529 Noninfective gastroenteritis and colitis, unspecified: Secondary | ICD-10-CM

## 2015-08-20 DIAGNOSIS — F17210 Nicotine dependence, cigarettes, uncomplicated: Secondary | ICD-10-CM | POA: Diagnosis present

## 2015-08-20 DIAGNOSIS — Z79899 Other long term (current) drug therapy: Secondary | ICD-10-CM

## 2015-08-20 DIAGNOSIS — E039 Hypothyroidism, unspecified: Secondary | ICD-10-CM | POA: Diagnosis present

## 2015-08-20 DIAGNOSIS — F1010 Alcohol abuse, uncomplicated: Secondary | ICD-10-CM | POA: Diagnosis present

## 2015-08-20 DIAGNOSIS — A419 Sepsis, unspecified organism: Secondary | ICD-10-CM | POA: Diagnosis not present

## 2015-08-20 DIAGNOSIS — E871 Hypo-osmolality and hyponatremia: Secondary | ICD-10-CM | POA: Diagnosis present

## 2015-08-20 DIAGNOSIS — E876 Hypokalemia: Secondary | ICD-10-CM

## 2015-08-20 DIAGNOSIS — R6521 Severe sepsis with septic shock: Secondary | ICD-10-CM | POA: Diagnosis present

## 2015-08-20 DIAGNOSIS — A047 Enterocolitis due to Clostridium difficile: Secondary | ICD-10-CM | POA: Diagnosis present

## 2015-08-20 DIAGNOSIS — K7030 Alcoholic cirrhosis of liver without ascites: Secondary | ICD-10-CM | POA: Diagnosis present

## 2015-08-20 DIAGNOSIS — I10 Essential (primary) hypertension: Secondary | ICD-10-CM | POA: Diagnosis present

## 2015-08-20 DIAGNOSIS — A0472 Enterocolitis due to Clostridium difficile, not specified as recurrent: Secondary | ICD-10-CM

## 2015-08-20 DIAGNOSIS — G629 Polyneuropathy, unspecified: Secondary | ICD-10-CM | POA: Diagnosis present

## 2015-08-20 DIAGNOSIS — R571 Hypovolemic shock: Secondary | ICD-10-CM | POA: Diagnosis present

## 2015-08-20 DIAGNOSIS — D696 Thrombocytopenia, unspecified: Secondary | ICD-10-CM | POA: Diagnosis present

## 2015-08-20 DIAGNOSIS — N390 Urinary tract infection, site not specified: Secondary | ICD-10-CM | POA: Diagnosis present

## 2015-08-20 DIAGNOSIS — E860 Dehydration: Secondary | ICD-10-CM | POA: Diagnosis present

## 2015-08-20 DIAGNOSIS — Z7952 Long term (current) use of systemic steroids: Secondary | ICD-10-CM | POA: Diagnosis not present

## 2015-08-20 DIAGNOSIS — Z96642 Presence of left artificial hip joint: Secondary | ICD-10-CM | POA: Diagnosis present

## 2015-08-20 DIAGNOSIS — B1920 Unspecified viral hepatitis C without hepatic coma: Secondary | ICD-10-CM | POA: Diagnosis present

## 2015-08-20 DIAGNOSIS — L8990 Pressure ulcer of unspecified site, unspecified stage: Secondary | ICD-10-CM | POA: Diagnosis present

## 2015-08-20 HISTORY — DX: Enterocolitis due to Clostridium difficile, not specified as recurrent: A04.72

## 2015-08-20 LAB — C DIFFICILE QUICK SCREEN W PCR REFLEX
C DIFFICILE (CDIFF) TOXIN: POSITIVE — AB
C DIFFICLE (CDIFF) ANTIGEN: POSITIVE — AB
C Diff interpretation: POSITIVE

## 2015-08-20 LAB — CBC
HCT: 33 % — ABNORMAL LOW (ref 40.0–52.0)
Hemoglobin: 11.5 g/dL — ABNORMAL LOW (ref 13.0–18.0)
MCH: 32.2 pg (ref 26.0–34.0)
MCHC: 34.9 g/dL (ref 32.0–36.0)
MCV: 92.4 fL (ref 80.0–100.0)
PLATELETS: 86 10*3/uL — AB (ref 150–440)
RBC: 3.57 MIL/uL — ABNORMAL LOW (ref 4.40–5.90)
RDW: 15.2 % — AB (ref 11.5–14.5)
WBC: 22.9 10*3/uL — AB (ref 3.8–10.6)

## 2015-08-20 LAB — COMPREHENSIVE METABOLIC PANEL
ALK PHOS: 178 U/L — AB (ref 38–126)
ALT: 17 U/L (ref 17–63)
AST: 49 U/L — AB (ref 15–41)
Albumin: 3.1 g/dL — ABNORMAL LOW (ref 3.5–5.0)
Anion gap: 16 — ABNORMAL HIGH (ref 5–15)
BILIRUBIN TOTAL: 2.5 mg/dL — AB (ref 0.3–1.2)
BUN: 15 mg/dL (ref 6–20)
CO2: 22 mmol/L (ref 22–32)
CREATININE: 0.87 mg/dL (ref 0.61–1.24)
Calcium: 7.2 mg/dL — ABNORMAL LOW (ref 8.9–10.3)
Chloride: 86 mmol/L — ABNORMAL LOW (ref 101–111)
GFR calc Af Amer: >60 mL/min (ref >60)
Glucose, Bld: 172 mg/dL — ABNORMAL HIGH (ref 65–99)
Potassium: 2.5 mmol/L — CL (ref 3.5–5.1)
Sodium: 124 mmol/L — ABNORMAL LOW (ref 135–145)
TOTAL PROTEIN: 6.3 g/dL — AB (ref 6.5–8.1)

## 2015-08-20 LAB — LIPASE, BLOOD: Lipase: 19 U/L — ABNORMAL LOW (ref 22–51)

## 2015-08-20 LAB — LACTIC ACID, PLASMA: Lactic Acid, Venous: 3.6 mmol/L (ref 0.5–2.0)

## 2015-08-20 LAB — CK: Total CK: 102 U/L (ref 49–397)

## 2015-08-20 MED ORDER — SODIUM CHLORIDE 0.9 % IV SOLN
Freq: Once | INTRAVENOUS | Status: AC
  Administered 2015-08-20: 23:00:00 via INTRAVENOUS

## 2015-08-20 MED ORDER — VANCOMYCIN 50 MG/ML ORAL SOLUTION
500.0000 mg | Freq: Four times a day (QID) | ORAL | Status: DC
  Filled 2015-08-20 (×6): qty 10

## 2015-08-20 MED ORDER — METRONIDAZOLE IN NACL 5-0.79 MG/ML-% IV SOLN
500.0000 mg | Freq: Three times a day (TID) | INTRAVENOUS | Status: DC
  Administered 2015-08-20: 500 mg via INTRAVENOUS
  Filled 2015-08-20 (×2): qty 100

## 2015-08-20 MED ORDER — SODIUM CHLORIDE 0.9 % IV SOLN
Freq: Once | INTRAVENOUS | Status: AC
  Administered 2015-08-20: 20:00:00 via INTRAVENOUS

## 2015-08-20 MED ORDER — POTASSIUM CHLORIDE CRYS ER 20 MEQ PO TBCR
40.0000 meq | EXTENDED_RELEASE_TABLET | Freq: Once | ORAL | Status: AC
  Administered 2015-08-20: 40 meq via ORAL
  Filled 2015-08-20: qty 2

## 2015-08-20 MED ORDER — POTASSIUM CHLORIDE 10 MEQ/100ML IV SOLN
10.0000 meq | INTRAVENOUS | Status: AC
  Administered 2015-08-20 – 2015-08-21 (×4): 10 meq via INTRAVENOUS
  Filled 2015-08-20 (×6): qty 100

## 2015-08-20 MED ORDER — SODIUM CHLORIDE 0.9 % IV BOLUS (SEPSIS)
1000.0000 mL | INTRAVENOUS | Status: AC
  Administered 2015-08-20: 1000 mL via INTRAVENOUS

## 2015-08-20 NOTE — ED Notes (Signed)
Will hang third bag of fluid after completion of 2nd bag via order from MD [facility]

## 2015-08-20 NOTE — ED Notes (Signed)
Pt instructed to notify RN once going to have bowel movement in order to obtain sample, pt verbalized understanding at this time

## 2015-08-20 NOTE — H&P (Signed)
[place] [facility] [place] NAME: Troy [name]    MR#:  [id number]  DATE OF BIRTH:  [birth year]  DATE OF ADMISSION:  08/20/2015  PRIMARY CARE PHYSICIAN: No PCP Per Patient   REQUESTING/REFERRING PHYSICIAN: Karma Greaser, MD  CHIEF COMPLAINT:   Chief Complaint  Patient presents with  . Diarrhea    HISTORY OF PRESENT ILLNESS:  Troy [name]  is a 44 y.o. male who presents with persistent large volume watery diarrhea for the past week. Patient states that he has been developing progressive generalized weakness, to the point that he was having a hard time getting up off of his sofa. Reportedly he was found covered in fecal matter as he was unable to get to the bathroom or to clean himself over the last 24 hours. She states on interview that he was recently treated for an episode of C. difficile, completing treatment 2 months ago. He states that required multiple different antibiotics. On arrival in the ED he was found to be tachycardic to the 120s to 130s, and was found to have a white count elevated at 22.9. He was also found to be hyponatremic with sodium of 124, and hypokalemic with a potassium of 2.5. His calcium was also low at 7.2.  PAST MEDICAL HISTORY:   Past Medical History  Diagnosis Date  . Liver damage   . Hypertension   . Neuropathy   . Seizures     complicated ETOH withdrawal  . Pancytopenia, acquired   . Ascites due to alcoholic cirrhosis   . DTs (delirium tremens)   . Hypothyroidism   . Hepatitis C   . C. difficile diarrhea     PAST SURGICAL HISTORY:   Past Surgical History  Procedure Laterality Date  . Joint replacement      Left hip replacement  . Esophagogastroduodenoscopy (egd) with propofol N/A 07/05/2015    Procedure: ESOPHAGOGASTRODUODENOSCOPY (EGD) WITH PROPOFOL;  Surgeon: Josefine Class, MD;  Location: [facility] ENDOSCOPY;  Service: Endoscopy;  Laterality: N/A;  . Colonoscopy with propofol N/A 07/05/2015    Procedure:  COLONOSCOPY WITH PROPOFOL;  Surgeon: Josefine Class, MD;  Location: [facility] ENDOSCOPY;  Service: Endoscopy;  Laterality: N/A;    SOCIAL HISTORY:   Social History  Substance Use Topics  . Smoking status: Current Every Day Smoker  . Smokeless tobacco: Not on file  . Alcohol Use: 0.0 oz/week    0 Standard drinks or equivalent per week     Comment: hx of chronic ETOH abuse. Pint a day.     FAMILY HISTORY:   Family History  Problem Relation Age of Onset  . Crohn's disease Father     DRUG ALLERGIES:  No Known Allergies  MEDICATIONS AT HOME:   Prior to Admission medications   Medication Sig Start Date End Date Taking? Authorizing Provider  folic acid (FOLVITE) 1 MG tablet Take 1 tablet (1 mg total) by mouth daily. 05/17/15   Epifanio Lesches, MD  furosemide (LASIX) 40 MG tablet Take 1 tablet (40 mg total) by mouth daily. 05/17/15   Epifanio Lesches, MD  HYDROcodone-acetaminophen (NORCO/VICODIN) 5-325 MG per tablet Take 1 tablet by mouth 3 (three) times daily as needed for moderate pain. 07/09/15   Fritzi Mandes, MD  levothyroxine (SYNTHROID, LEVOTHROID) 25 MCG tablet Take 1 tablet (25 mcg total) by mouth daily before breakfast. 07/09/15   Fritzi Mandes, MD  loperamide (IMODIUM) 2 MG capsule Take 1 capsule (2 mg total) by mouth 3 (three) times daily.  06/21/15   Demetrios Loll, MD  magnesium 30 MG tablet Take 1 tablet (30 mg total) by mouth 2 (two) times daily. 06/21/15   Demetrios Loll, MD  magnesium oxide (MAG-OX) 400 (241.3 MG) MG tablet Take 1 tablet (400 mg total) by mouth daily. 07/09/15   Fritzi Mandes, MD  nadolol (CORGARD) 40 MG tablet Take 1 tablet (40 mg total) by mouth daily. 05/17/15   Epifanio Lesches, MD  pantoprazole (PROTONIX) 40 MG tablet Take 1 tablet (40 mg total) by mouth daily. 06/21/15   Demetrios Loll, MD  potassium chloride SA (K-DUR,KLOR-CON) 20 MEQ tablet Take 2 tablets (40 mEq total) by mouth 2 (two) times daily. 05/17/15   Epifanio Lesches, MD  predniSONE (DELTASONE) 20 MG tablet Take  2 tablets (40 mg total) by mouth daily with breakfast. 07/09/15   Fritzi Mandes, MD  pregabalin (LYRICA) 150 MG capsule Take 150 mg by mouth 2 (two) times daily as needed (for nerve pain).    Historical Provider, MD  QUEtiapine (SEROQUEL) 50 MG tablet Take 50 mg by mouth at bedtime as needed (for sleep).    Historical Provider, MD  saccharomyces boulardii (FLORASTOR) 250 MG capsule Take 1 capsule (250 mg total) by mouth 2 (two) times daily. 06/21/15   Demetrios Loll, MD  thiamine 100 MG tablet Take 1 tablet (100 mg total) by mouth daily. 05/17/15   Epifanio Lesches, MD  zinc oxide 20 % ointment Apply topically 2 (two) times daily as needed for irritation. Apply to button 06/21/15   Demetrios Loll, MD    REVIEW OF SYSTEMS:  Review of Systems  Constitutional: Negative for fever, chills, weight loss and malaise/fatigue.  HENT: Negative for ear pain, hearing loss and tinnitus.   Eyes: Negative for blurred vision, double vision, pain and redness.  Respiratory: Negative for cough, hemoptysis and shortness of breath.   Cardiovascular: Negative for chest pain, palpitations, orthopnea and leg swelling.  Gastrointestinal: Positive for diarrhea. Negative for nausea, vomiting, abdominal pain and constipation.  Genitourinary: Negative for dysuria, frequency and hematuria.  Musculoskeletal: Negative for back pain, joint pain and neck pain.  Skin:       No acne, rash, or lesions  Neurological: Positive for weakness. Negative for dizziness, tremors and focal weakness.  Endo/Heme/Allergies: Negative for polydipsia. Does not bruise/bleed easily.  Psychiatric/Behavioral: Negative for depression. The patient is not nervous/anxious and does not have insomnia.      VITAL SIGNS:   Filed Vitals:   08/20/15 2000 08/20/15 2115 08/20/15 2145 08/20/15 2251  BP: 118/74 115/78 133/79 151/85  Pulse: 131 134 125 122  Temp:      TempSrc:      Resp: 25 18 17 20   Height:      Weight:      SpO2: 100% 99% 100% 100%   Wt Readings  from Last 3 Encounters:  08/20/15 104.327 kg (230 lb)  07/09/15 99.565 kg (219 lb 8 oz)  06/20/15 98.34 kg (216 lb 12.8 oz)    PHYSICAL EXAMINATION:  Physical Exam  Vitals reviewed. Constitutional: He is oriented to person, place, and time. He appears well-developed and well-nourished. No distress.  HENT:  Head: Normocephalic and atraumatic.  Mouth/Throat: Oropharynx is clear and moist.  Eyes: Conjunctivae and EOM are normal. Pupils are equal, round, and reactive to light. No scleral icterus.  Neck: Normal range of motion. Neck supple. No JVD present. No thyromegaly present.  Cardiovascular: Normal rate, regular rhythm and intact distal pulses.  Exam reveals no gallop and no friction  rub.   No murmur heard. Respiratory: Effort normal and breath sounds normal. No respiratory distress. He has no wheezes. He has no rales.  GI: Soft. Bowel sounds are normal. He exhibits no distension. There is no tenderness.  Musculoskeletal: Normal range of motion. He exhibits no edema.  No arthritis, no gout  Lymphadenopathy:    He has no cervical adenopathy.  Neurological: He is alert and oriented to person, place, and time. No cranial nerve deficit.  No dysarthria, no aphasia. He does have bilateral lower extremity general weakness, with 2 out of 5 strength. Upper extremities have normal strength.  Skin: Skin is warm and dry. No rash noted. No erythema.  Psychiatric: His behavior is normal. Thought content normal.  Patient has flat affect.     LABORATORY PANEL:   CBC  Recent Labs Lab 08/20/15 1945  WBC 22.9*  HGB 11.5*  HCT 33.0*  PLT 86*   ------------------------------------------------------------------------------------------------------------------  Chemistries   Recent Labs Lab 08/20/15 1945  NA 124*  K 2.5*  CL 86*  CO2 22  GLUCOSE 172*  BUN 15  CREATININE 0.87  CALCIUM 7.2*  AST 49*  ALT 17  ALKPHOS 178*  BILITOT 2.5*    ------------------------------------------------------------------------------------------------------------------  Cardiac Enzymes No results for input(s): TROPONINI in the last 168 hours. ------------------------------------------------------------------------------------------------------------------  RADIOLOGY:  Dg Chest Portable 1 View  08/20/2015   CLINICAL DATA:  Found on floor.  EXAM: PORTABLE CHEST - 1 VIEW  COMPARISON:  06/06/2015  FINDINGS: The heart size and mediastinal contours are within normal limits. Both lungs are clear. The visualized skeletal structures are unremarkable.  IMPRESSION: No active disease.   Electronically Signed   By: Franchot Gallo M.D.   On: 08/20/2015 22:00    EKG:   Orders placed or performed during the hospital encounter of 08/20/15  . EKG 12-Lead  . EKG 12-Lead    IMPRESSION AND PLAN:  Principal Problem:   Enteritis due to Clostridium difficile - IV Flagyl and by mouth vancomycin for severe C. difficile given his white count greater than 15,000. We'll get a GI consult. We will be aggressive with IV fluids as his lactic acid is elevated at 3.6 and he is profoundly dehydrated. Active Problems:   Hypokalemia - replacement ordered in the ED, we will follow this serially given his persistent diarrhea, and replace as necessary   Hyponatremia - likely due to his significant diarrhea and dehydration. We will hydrate him aggressively with IV fluids and monitor this value serially.   Hypocalcemia - also likely related to alterations in by mouth intake as well as his profound diarrhea. We will replace this IV and continue to monitor with serial BMPs.   Alcohol abuse - CIWA protocol, this is documented as the cause of his chronic thrombocytopenia   HTN (hypertension) - hold antihypertensives for now as he is having profound diarrhea and dehydration, can be restarted when necessary after his blood pressure improves   Hypothyroidism (acquired) - home dose  thyroid replacement   GERD (gastroesophageal reflux disease) - PPI  All the records are reviewed and case discussed with ED provider. Management plans discussed with the patient and/or family.  DVT PROPHYLAXIS: mechanical only due to chronic thrombocytopenia  ADMISSION STATUS: Inpatient  CODE STATUS: Prior  TOTAL TIME TAKING CARE OF THIS PATIENT: 40 minutes.    Reem Fleury FIELDING 08/20/2015, 11:02 PM  [facility]  Office  [phone]  CC: Primary care physician; No PCP Per Patient

## 2015-08-20 NOTE — ED Notes (Signed)
Pharmacy called regarding Potassium IVPB, will send to ED.

## 2015-08-20 NOTE — ED Provider Notes (Signed)
[facility] Emergency Department Provider Note  ____________________________________________  Time seen: Approximately 9:25 PM  I have reviewed the triage vital signs and the nursing notes.   HISTORY  Chief Complaint Diarrhea    HPI Troy [name] is a 44 y.o. male with a past medical history that includes 2 prior episodes of C. Difficile and hospitalization just under 2 months ago after a fall with nausea, vomiting, and colitis who presents with profuse diarrhea and having been found in a couch covered in feces and unable to get up for about 24 hours.He states that he was housesitting for his sister but was having difficulty getting up out of her "low furniture".  He states he was able to do so for the first 2 days, but in the third day he simply could not get up out of the couch.  He was also having diarrhea, so he had numerous bowel movements while sitting in the couch course of the day.  When he was found he was covered "head to toe" in fecal matter.  When he arrives to the emergency department his pulse was in the 130s but he is afebrile and alert and oriented.   Past Medical History  Diagnosis Date  . Liver damage   . Hypertension   . Neuropathy   . Seizures     complicated ETOH withdrawal  . Pancytopenia, acquired   . Ascites due to alcoholic cirrhosis   . DTs (delirium tremens)   . Hypothyroidism   . Hepatitis C   . C. difficile diarrhea     Patient Active Problem List   Diagnosis Date Noted  . Alcohol abuse 08/20/2015  . HTN (hypertension) 08/20/2015  . Hypothyroidism (acquired) 08/20/2015  . GERD (gastroesophageal reflux disease) 08/20/2015  . Hypocalcemia 06/09/2015  . Protein-calorie malnutrition, severe 06/09/2015  . Hypokalemia 06/07/2015  . Enteritis due to Clostridium difficile 06/07/2015  . Anemia of chronic disease 06/07/2015  . Hyponatremia 06/07/2015  . Sepsis 06/06/2015  . Acute renal failure 06/06/2015  . Alcohol withdrawal  seizure 05/13/2015  . Lactic acidosis 05/13/2015  . Leukocytosis 05/13/2015    Past Surgical History  Procedure Laterality Date  . Joint replacement      Left hip replacement  . Esophagogastroduodenoscopy (egd) with propofol N/A 07/05/2015    Procedure: ESOPHAGOGASTRODUODENOSCOPY (EGD) WITH PROPOFOL;  Surgeon: Josefine Class, MD;  Location: [facility] ENDOSCOPY;  Service: Endoscopy;  Laterality: N/A;  . Colonoscopy with propofol N/A 07/05/2015    Procedure: COLONOSCOPY WITH PROPOFOL;  Surgeon: Josefine Class, MD;  Location: [facility] ENDOSCOPY;  Service: Endoscopy;  Laterality: N/A;    Current Outpatient Rx  Name  Route  Sig  Dispense  Refill  . folic acid (FOLVITE) 1 MG tablet   Oral   Take 1 tablet (1 mg total) by mouth daily.   30 tablet   0   . furosemide (LASIX) 40 MG tablet   Oral   Take 1 tablet (40 mg total) by mouth daily.   30 tablet   0   . HYDROcodone-acetaminophen (NORCO/VICODIN) 5-325 MG per tablet   Oral   Take 1 tablet by mouth 3 (three) times daily as needed for moderate pain.   30 tablet   0   . levothyroxine (SYNTHROID, LEVOTHROID) 25 MCG tablet   Oral   Take 1 tablet (25 mcg total) by mouth daily before breakfast.   30 tablet   0   . loperamide (IMODIUM) 2 MG capsule   Oral  Take 1 capsule (2 mg total) by mouth 3 (three) times daily.   30 capsule   0   . magnesium 30 MG tablet   Oral   Take 1 tablet (30 mg total) by mouth 2 (two) times daily.   14 tablet   0   . magnesium oxide (MAG-OX) 400 (241.3 MG) MG tablet   Oral   Take 1 tablet (400 mg total) by mouth daily.   30 tablet   0   . nadolol (CORGARD) 40 MG tablet   Oral   Take 1 tablet (40 mg total) by mouth daily.   30 tablet   0   . pantoprazole (PROTONIX) 40 MG tablet   Oral   Take 1 tablet (40 mg total) by mouth daily.   60 tablet   0   . potassium chloride SA (K-DUR,KLOR-CON) 20 MEQ tablet   Oral   Take 2 tablets (40 mEq total) by mouth 2 (two) times daily.   20  tablet   0   . predniSONE (DELTASONE) 20 MG tablet   Oral   Take 2 tablets (40 mg total) by mouth daily with breakfast.   30 tablet   0   . pregabalin (LYRICA) 150 MG capsule   Oral   Take 150 mg by mouth 2 (two) times daily as needed (for nerve pain).         . QUEtiapine (SEROQUEL) 50 MG tablet   Oral   Take 50 mg by mouth at bedtime as needed (for sleep).         Marland Kitchen saccharomyces boulardii (FLORASTOR) 250 MG capsule   Oral   Take 1 capsule (250 mg total) by mouth 2 (two) times daily.   20 capsule   0   . thiamine 100 MG tablet   Oral   Take 1 tablet (100 mg total) by mouth daily.   30 tablet   0   . zinc oxide 20 % ointment   Topical   Apply topically 2 (two) times daily as needed for irritation. Apply to button   56.7 g   0     Allergies Review of patient's allergies indicates no known allergies.  Family History  Problem Relation Age of Onset  . Crohn's disease Father     Social History Social History  Substance Use Topics  . Smoking status: Current Every Day Smoker  . Smokeless tobacco: None  . Alcohol Use: 0.0 oz/week    0 Standard drinks or equivalent per week     Comment: hx of chronic ETOH abuse. Pint a day.     Review of Systems Constitutional: No fever/chills Eyes: No visual changes. ENT: No sore throat. Cardiovascular: Denies chest pain. Respiratory: Denies shortness of breath. Gastrointestinal: No abdominal pain.  No nausea, no vomiting.  Profuse watery diarrhea. Genitourinary: Negative for dysuria. Musculoskeletal: Chronic bilateral leg pain Skin: Negative for rash. Neurological: Negative for headaches, focal weakness or numbness.  Chronic neuropathy  10-point ROS otherwise negative.  ____________________________________________   PHYSICAL EXAM:  VITAL SIGNS: ED Triage Vitals  Enc Vitals Group     BP 08/20/15 1935 116/64 mmHg     Pulse Rate 08/20/15 1935 135     Resp 08/20/15 1935 20     Temp 08/20/15 1935 98.7 F (37.1  C)     Temp Source 08/20/15 1935 Oral     SpO2 08/20/15 1935 99 %     Weight 08/20/15 1935 230 lb (104.327 kg)  Height 08/20/15 1935 5\' 8"  (1.727 m)     Head Cir --      Peak Flow --      Pain Score 08/20/15 1936 7     Pain Loc --      Pain Edu? --      Excl. in [place]? --     Constitutional: Alert and oriented.  no acute distress. Eyes: Conjunctivae are normal. PERRL. EOMI. Head: Atraumatic. Nose: No congestion/rhinnorhea. Mouth/Throat: Mucous membranes are moist.  Oropharynx non-erythematous. Neck: No stridor.   Cardiovascular: Normal rate, regular rhythm. Grossly normal heart sounds.  Good peripheral circulation. Respiratory: Normal respiratory effort.  No retractions. Lungs CTAB. Gastrointestinal: Obese.  Soft and nontender. No distention. No abdominal bruits. No CVA tenderness.  Multiple episodes of watery and green diarrhea while in the emergency department.   Musculoskeletal: Chronic bilateral lower extremity pitting edema with erythema but the patient states is normal for him. Neurologic:  Normal speech and language. No gross focal neurologic deficits are appreciated.  Skin:  Skin is warm, dry and intact. No rash noted. Psychiatric: Mood and affect are flat. Speech and behavior are normal.  ____________________________________________   LABS (all labs ordered are listed, but only abnormal results are displayed)  Labs Reviewed  C DIFFICILE QUICK SCREEN W PCR REFLEX - Abnormal; Notable for the following:    C Diff antigen POSITIVE (*)    C Diff toxin POSITIVE (*)    All other components within normal limits  LIPASE, BLOOD - Abnormal; Notable for the following:    Lipase 19 (*)    All other components within normal limits  COMPREHENSIVE METABOLIC PANEL - Abnormal; Notable for the following:    Sodium 124 (*)    Potassium 2.5 (*)    Chloride 86 (*)    Glucose, Bld 172 (*)    Calcium 7.2 (*)    Total Protein 6.3 (*)    Albumin 3.1 (*)    AST 49 (*)    Alkaline  Phosphatase 178 (*)    Total Bilirubin 2.5 (*)    Anion gap 16 (*)    All other components within normal limits  CBC - Abnormal; Notable for the following:    WBC 22.9 (*)    RBC 3.57 (*)    Hemoglobin 11.5 (*)    HCT 33.0 (*)    RDW 15.2 (*)    Platelets 86 (*)    All other components within normal limits  URINE CULTURE  CULTURE, BLOOD (ROUTINE X 2)  CULTURE, BLOOD (ROUTINE X 2)  CK  TROPONIN I  URINALYSIS COMPLETEWITH MICROSCOPIC ([facility] ONLY)  LACTIC ACID, PLASMA  LACTIC ACID, PLASMA   ____________________________________________  EKG  ED ECG REPORT I, Sharanya Templin, the attending physician, personally viewed and interpreted this ECG.   Date: 08/20/2015  EKG Time: 19:46  Rate: 136  Rhythm: Sinus tachycardia  Axis: Normal  Intervals:Normal  ST&T Change: Non-specific ST segment / T-wave changes, but no evidence of acute ischemia.   ____________________________________________  RADIOLOGY   Dg Chest Portable 1 View  08/20/2015   CLINICAL DATA:  Found on floor.  EXAM: PORTABLE CHEST - 1 VIEW  COMPARISON:  06/06/2015  FINDINGS: The heart size and mediastinal contours are within normal limits. Both lungs are clear. The visualized skeletal structures are unremarkable.  IMPRESSION: No active disease.   Electronically Signed   By: Franchot Gallo M.D.   On: 08/20/2015 22:00    ____________________________________________   PROCEDURES  Procedure(s) performed: None  Critical Care performed: No ____________________________________________   INITIAL IMPRESSION / ASSESSMENT AND PLAN / ED COURSE  Pertinent labs & imaging results that were available during my care of the patient were reviewed by me and considered in my medical decision making (see chart for details).  Surprisingly after the ordeal that was reported, the patient is in no acute distress and oriented.  He is severely hypokalemic and I have started both oral and IV repletion.  He is also mildly hyponatremic.   However the rest of his labs are reassuring including no evidence of rhabdomyolysis.  He does, however, have a leukocytosis of 23 and is persistently tachycardic in the 120s to 130s.  Given the possibility that he may have C. difficile, I am withholding empiric antibiotics until we check the C. difficile PCR.  He does meet sepsis criteria but I do not yet have a source.  ----------------------------------------- 10:58 PM on 08/20/2015 -----------------------------------------  +C diff.  Severe based on leukocytosis.  Discussed with hospitalist, started metronidazole 500 mg IV and vancomycin 500 mg PO based on workup.  ____________________________________________  FINAL CLINICAL IMPRESSION(S) / ED DIAGNOSES  Final diagnoses:  Sepsis, due to unspecified organism  Hypokalemia  Severe diarrhea  C. difficile diarrhea      NEW MEDICATIONS STARTED DURING THIS VISIT:  New Prescriptions   No medications on file     Hinda Kehr, MD 08/20/15 2300

## 2015-08-20 NOTE — ED Notes (Signed)
Pharmacy called regarding antibiotics, will send to ED.

## 2015-08-20 NOTE — ED Notes (Signed)
Pt to ED via EMS due to call for fever. Pt found at sisters house covered head to toe in fecal matter. Pt with hx of Cdiff, having diarrhea at this time. Pt afebrile at 98.7. Pt denies any pain at this time other than chronic pain in lower extremities due to neuropathy. Pt is AAOx3 and taken to decontamination room on arrival.

## 2015-08-21 ENCOUNTER — Encounter: Payer: Self-pay | Admitting: *Deleted

## 2015-08-21 DIAGNOSIS — L8990 Pressure ulcer of unspecified site, unspecified stage: Secondary | ICD-10-CM | POA: Diagnosis present

## 2015-08-21 LAB — BASIC METABOLIC PANEL
ANION GAP: 9 (ref 5–15)
ANION GAP: 10 (ref 5–15)
ANION GAP: 8 (ref 5–15)
Anion gap: 11 (ref 5–15)
BUN: 13 mg/dL (ref 6–20)
BUN: 10 mg/dL (ref 6–20)
BUN: 8 mg/dL (ref 6–20)
BUN: 7 mg/dL (ref 6–20)
CALCIUM: 6.6 mg/dL — AB (ref 8.9–10.3)
CALCIUM: 6.8 mg/dL — AB (ref 8.9–10.3)
CALCIUM: 6.9 mg/dL — AB (ref 8.9–10.3)
CHLORIDE: 98 mmol/L — AB (ref 101–111)
CO2: 19 mmol/L — AB (ref 22–32)
CO2: 21 mmol/L — AB (ref 22–32)
CO2: 23 mmol/L (ref 22–32)
CO2: 23 mmol/L (ref 22–32)
CREATININE: 0.67 mg/dL (ref 0.61–1.24)
Calcium: 6.9 mg/dL — ABNORMAL LOW (ref 8.9–10.3)
Chloride: 101 mmol/L (ref 101–111)
Chloride: 105 mmol/L (ref 101–111)
Chloride: 104 mmol/L (ref 101–111)
Creatinine, Ser: 0.64 mg/dL (ref 0.61–1.24)
Creatinine, Ser: 0.58 mg/dL — ABNORMAL LOW (ref 0.61–1.24)
Creatinine, Ser: 0.75 mg/dL (ref 0.61–1.24)
GFR calc Af Amer: >60 mL/min (ref >60)
GFR calc Af Amer: >60 mL/min (ref >60)
GFR calc Af Amer: >60 mL/min (ref >60)
GFR calc non Af Amer: >60 mL/min (ref >60)
GFR calc non Af Amer: >60 mL/min (ref >60)
GFR calc non Af Amer: >60 mL/min (ref >60)
GFR calc non Af Amer: >60 mL/min (ref >60)
GLUCOSE: 101 mg/dL — AB (ref 65–99)
GLUCOSE: 95 mg/dL (ref 65–99)
GLUCOSE: 104 mg/dL — AB (ref 65–99)
Glucose, Bld: 112 mg/dL — ABNORMAL HIGH (ref 65–99)
POTASSIUM: 3 mmol/L — AB (ref 3.5–5.1)
POTASSIUM: 2.4 mmol/L — AB (ref 3.5–5.1)
Potassium: 2.6 mmol/L — CL (ref 3.5–5.1)
Potassium: 2.8 mmol/L — CL (ref 3.5–5.1)
SODIUM: 132 mmol/L — AB (ref 135–145)
Sodium: 133 mmol/L — ABNORMAL LOW (ref 135–145)
Sodium: 134 mmol/L — ABNORMAL LOW (ref 135–145)
Sodium: 133 mmol/L — ABNORMAL LOW (ref 135–145)

## 2015-08-21 LAB — URINALYSIS COMPLETE WITH MICROSCOPIC (ARMC ONLY)
Bilirubin Urine: NEGATIVE
Glucose, UA: NEGATIVE mg/dL
Hgb urine dipstick: NEGATIVE
Ketones, ur: NEGATIVE mg/dL
Nitrite: POSITIVE — AB
PROTEIN: NEGATIVE mg/dL
Specific Gravity, Urine: 1.009 (ref 1.005–1.030)
pH: 6 (ref 5.0–8.0)

## 2015-08-21 LAB — CBC
HCT: 27.6 % — ABNORMAL LOW (ref 40.0–52.0)
Hemoglobin: 9.4 g/dL — ABNORMAL LOW (ref 13.0–18.0)
MCH: 31.6 pg (ref 26.0–34.0)
MCHC: 34 g/dL (ref 32.0–36.0)
MCV: 92.7 fL (ref 80.0–100.0)
PLATELETS: 64 10*3/uL — AB (ref 150–440)
RBC: 2.98 MIL/uL — AB (ref 4.40–5.90)
RDW: 15.1 % — ABNORMAL HIGH (ref 11.5–14.5)
WBC: 14.5 10*3/uL — AB (ref 3.8–10.6)

## 2015-08-21 LAB — PHOSPHORUS: Phosphorus: 1.2 mg/dL — ABNORMAL LOW (ref 2.5–4.6)

## 2015-08-21 LAB — LACTIC ACID, PLASMA
Lactic Acid, Venous: 1 mmol/L (ref 0.5–2.0)
Lactic Acid, Venous: 0.9 mmol/L (ref 0.5–2.0)

## 2015-08-21 LAB — MAGNESIUM: Magnesium: 0.7 mg/dL — CL (ref 1.7–2.4)

## 2015-08-21 LAB — TROPONIN I
Troponin I: 0.03 ng/mL (ref <0.031)
Troponin I: 0.04 ng/mL — ABNORMAL HIGH (ref <0.031)

## 2015-08-21 MED ORDER — SODIUM CHLORIDE 0.9 % IV SOLN
1.0000 g | Freq: Once | INTRAVENOUS | Status: AC
  Administered 2015-08-21: 1 g via INTRAVENOUS
  Filled 2015-08-21: qty 10

## 2015-08-21 MED ORDER — POTASSIUM CHLORIDE IN NACL 40-0.9 MEQ/L-% IV SOLN
INTRAVENOUS | Status: DC
Start: 2015-08-21 — End: 2015-08-22
  Administered 2015-08-21: 150 mL/h via INTRAVENOUS
  Filled 2015-08-21: qty 1000

## 2015-08-21 MED ORDER — SODIUM CHLORIDE 0.9 % IJ SOLN
3.0000 mL | Freq: Two times a day (BID) | INTRAMUSCULAR | Status: DC
  Administered 2015-08-21 – 2015-08-29 (×15): 3 mL via INTRAVENOUS

## 2015-08-21 MED ORDER — ACETAMINOPHEN 325 MG PO TABS: 650.0000 mg | ORAL_TABLET | Freq: Four times a day (QID) | ORAL | Status: DC | PRN

## 2015-08-21 MED ORDER — ACETAMINOPHEN 650 MG RE SUPP: 650.0000 mg | Freq: Four times a day (QID) | RECTAL | Status: DC | PRN

## 2015-08-21 MED ORDER — MAGNESIUM SULFATE 4 GM/100ML IV SOLN
4.0000 g | Freq: Once | INTRAVENOUS | Status: AC
  Administered 2015-08-21: 4 g via INTRAVENOUS
  Filled 2015-08-21: qty 100

## 2015-08-21 MED ORDER — MAGNESIUM OXIDE 400 (241.3 MG) MG PO TABS: 400.0000 mg | ORAL_TABLET | ORAL | Status: DC | PRN

## 2015-08-21 MED ORDER — MAGNESIUM SULFATE 2 GM/50ML IV SOLN
2.0000 g | Freq: Once | INTRAVENOUS | Status: AC
  Administered 2015-08-21: 2 g via INTRAVENOUS
  Filled 2015-08-21: qty 50

## 2015-08-21 MED ORDER — ONDANSETRON HCL 4 MG/2ML IJ SOLN: 4.0000 mg | Freq: Four times a day (QID) | INTRAMUSCULAR | Status: DC | PRN

## 2015-08-21 MED ORDER — LEVOTHYROXINE SODIUM 25 MCG PO TABS
25.0000 ug | ORAL_TABLET | Freq: Every day | ORAL | Status: DC
  Administered 2015-08-21 – 2015-08-29 (×9): 25 ug via ORAL
  Filled 2015-08-21 (×9): qty 1

## 2015-08-21 MED ORDER — THIAMINE HCL 100 MG/ML IJ SOLN: 100.0000 mg | Freq: Every day | INTRAMUSCULAR | Status: DC

## 2015-08-21 MED ORDER — VANCOMYCIN 50 MG/ML ORAL SOLUTION
500.0000 mg | Freq: Four times a day (QID) | ORAL | Status: DC
  Administered 2015-08-21 (×3): 500 mg via ORAL
  Filled 2015-08-21 (×7): qty 10

## 2015-08-21 MED ORDER — THIAMINE HCL 100 MG/ML IJ SOLN
Freq: Once | INTRAVENOUS | Status: AC
  Administered 2015-08-21: 08:00:00 via INTRAVENOUS
  Filled 2015-08-21: qty 1000

## 2015-08-21 MED ORDER — ZINC OXIDE 40 % EX OINT
TOPICAL_OINTMENT | CUTANEOUS | Status: DC | PRN
  Filled 2015-08-21: qty 114

## 2015-08-21 MED ORDER — METRONIDAZOLE IN NACL 5-0.79 MG/ML-% IV SOLN
500.0000 mg | Freq: Three times a day (TID) | INTRAVENOUS | Status: DC
  Administered 2015-08-21 – 2015-08-29 (×24): 500 mg via INTRAVENOUS
  Filled 2015-08-21 (×29): qty 100

## 2015-08-21 MED ORDER — SODIUM CHLORIDE 0.9 % IV SOLN
INTRAVENOUS | Status: DC
  Administered 2015-08-21 (×2): via INTRAVENOUS

## 2015-08-21 MED ORDER — PANTOPRAZOLE SODIUM 40 MG PO TBEC
40.0000 mg | DELAYED_RELEASE_TABLET | Freq: Every day | ORAL | Status: DC
  Administered 2015-08-21: 40 mg via ORAL
  Filled 2015-08-21: qty 1

## 2015-08-21 MED ORDER — POTASSIUM CHLORIDE 10 MEQ/100ML IV SOLN
10.0000 meq | INTRAVENOUS | Status: AC
  Administered 2015-08-21 – 2015-08-22 (×6): 10 meq via INTRAVENOUS
  Filled 2015-08-21 (×6): qty 100

## 2015-08-21 MED ORDER — ONDANSETRON HCL 4 MG PO TABS: 4.0000 mg | ORAL_TABLET | Freq: Four times a day (QID) | ORAL | Status: DC | PRN

## 2015-08-21 MED ORDER — SACCHAROMYCES BOULARDII 250 MG PO CAPS
250.0000 mg | ORAL_CAPSULE | Freq: Two times a day (BID) | ORAL | Status: DC
  Administered 2015-08-21 – 2015-08-29 (×17): 250 mg via ORAL
  Filled 2015-08-21 (×17): qty 1

## 2015-08-21 MED ORDER — POTASSIUM CHLORIDE 10 MEQ/100ML IV SOLN
10.0000 meq | INTRAVENOUS | Status: AC
  Administered 2015-08-21 (×2): 10 meq via INTRAVENOUS

## 2015-08-21 MED ORDER — POTASSIUM & SODIUM PHOSPHATES 280-160-250 MG PO PACK
2.0000 | PACK | Freq: Three times a day (TID) | ORAL | Status: AC
  Administered 2015-08-21 (×3): 2 via ORAL
  Filled 2015-08-21 (×3): qty 2

## 2015-08-21 MED ORDER — LORAZEPAM 1 MG PO TABS: 1.0000 mg | ORAL_TABLET | Freq: Four times a day (QID) | ORAL | Status: AC | PRN

## 2015-08-21 MED ORDER — FOLIC ACID 1 MG PO TABS
1.0000 mg | ORAL_TABLET | Freq: Every day | ORAL | Status: DC
  Administered 2015-08-21 – 2015-08-29 (×9): 1 mg via ORAL
  Filled 2015-08-21 (×9): qty 1

## 2015-08-21 MED ORDER — QUETIAPINE FUMARATE 25 MG PO TABS: 50.0000 mg | ORAL_TABLET | Freq: Every evening | ORAL | Status: DC | PRN

## 2015-08-21 MED ORDER — VITAMIN B-1 100 MG PO TABS
100.0000 mg | ORAL_TABLET | Freq: Every day | ORAL | Status: DC
  Administered 2015-08-21 – 2015-08-29 (×9): 100 mg via ORAL
  Filled 2015-08-21 (×9): qty 1

## 2015-08-21 MED ORDER — LORAZEPAM 2 MG/ML IJ SOLN: 1.0000 mg | Freq: Four times a day (QID) | INTRAMUSCULAR | Status: AC | PRN

## 2015-08-21 MED ORDER — ADULT MULTIVITAMIN W/MINERALS CH
1.0000 | ORAL_TABLET | Freq: Every day | ORAL | Status: DC
  Administered 2015-08-21 – 2015-08-29 (×9): 1 via ORAL
  Filled 2015-08-21 (×9): qty 1

## 2015-08-21 MED ORDER — NADOLOL 20 MG PO TABS
40.0000 mg | ORAL_TABLET | Freq: Every day | ORAL | Status: DC
  Administered 2015-08-21 – 2015-08-29 (×7): 40 mg via ORAL
  Filled 2015-08-21 (×8): qty 2

## 2015-08-21 MED ORDER — VANCOMYCIN 50 MG/ML ORAL SOLUTION
125.0000 mg | Freq: Four times a day (QID) | ORAL | Status: DC
  Administered 2015-08-21 – 2015-08-26 (×16): 125 mg via ORAL
  Filled 2015-08-21 (×25): qty 2.5

## 2015-08-21 NOTE — Consult Note (Signed)
Pt having 2ed or 3rd  round of C. Diff infection, first was in late June and July.  His stool was positive.  He has hx of cirrhosis and esoph varices.  Will follow with you.  Agree with vancomycin and iv flagyl for now.   Will check SIEP to see if he is malnourished in gamma globulins.  May need stool transplant and may need iv gamma globulin infusion.

## 2015-08-21 NOTE — Progress Notes (Addendum)
Dr. Marcille Blanco notified of critical Mg+ 0.7; pt. C/o raw bottom and requests rectal tube and zinc oxide for bottom. Nanami Whitelaw K, RN;08/21/2015;3:25 AM   Pt. Clarified that he has HAD a rectal tube and does NOT want one this time. Altie Savard K, RN;6:21 AM;08/21/2015

## 2015-08-21 NOTE — Progress Notes (Signed)
[facility] [place] NAME: Troy [name]    MR#:  [id number]  DATE OF BIRTH:  1971/02/16 Admitted for severe diaarea,recurrent  Cdiff.cloitis.diarrhea is less, today.no abdominal pain,no nausea,no vomiting. SUBJECTIVE:  CHIEF COMPLAINT:   Chief Complaint  Patient presents with  . Diarrhea    REVIEW OF SYSTEMS:   ROS CONSTITUTIONAL: No fever, fatigue or weakness.  EYES: No blurred or double vision.  EARS, NOSE, AND THROAT: No tinnitus or ear pain.  RESPIRATORY: No cough, shortness of breath, wheezing or hemoptysis.  CARDIOVASCULAR: No chest pain, orthopnea, edema.  GASTROINTESTINAL: No nausea, vomiting, diarrhea or abdominal pain.  GENITOURINARY: No dysuria, hematuria.  ENDOCRINE: No polyuria, nocturia,  HEMATOLOGY: No anemia, easy bruising or bleeding SKIN: No rash or lesion. MUSCULOSKELETAL: No joint pain or arthritis.   NEUROLOGIC: No tingling, numbness, weakness.  PSYCHIATRY: No anxiety or depression.   DRUG ALLERGIES:  No Known Allergies  VITALS:  Blood pressure 119/71, pulse 109, temperature 98.3 F (36.8 C), temperature source Oral, resp. rate 18, height 5\' 8"  (1.727 m), weight 98.839 kg (217 lb 14.4 oz), SpO2 100 %.  PHYSICAL EXAMINATION:  GENERAL:  44 y.o.-year-old patient lying in the bed with no acute distress.  EYES: Pupils equal, round, reactive to light and accommodation. No scleral icterus. Extraocular muscles intact.  HEENT: Head atraumatic, normocephalic. Oropharynx and nasopharynx clear.  NECK:  Supple, no jugular venous distention. No thyroid enlargement, no tenderness.  LUNGS: Normal breath sounds bilaterally, no wheezing, rales,rhonchi or crepitation. No use of accessory muscles of respiration.  CARDIOVASCULAR: S1, S2 normal. No murmurs, rubs, or gallops.  ABDOMEN: Soft, nontender, nondistended. Bowel sounds present. No organomegaly or mass.  EXTREMITIES: No pedal edema, cyanosis, or clubbing.   NEUROLOGIC: Cranial nerves II through XII are intact. Muscle strength 5/5 in all extremities. Sensation intact. Gait not checked.  PSYCHIATRIC: The patient is alert and oriented x 3.  SKIN: No obvious rash, lesion, or ulcer.    LABORATORY PANEL:   CBC  Recent Labs Lab 08/21/15 0053  WBC 14.5*  HGB 9.4*  HCT 27.6*  PLT 64*   ------------------------------------------------------------------------------------------------------------------  Chemistries   Recent Labs Lab 08/20/15 1945  08/21/15 0808  NA 124*  < > 133*  K 2.5*  < > 2.8*  CL 86*  < > 101  CO2 22  < > 23  GLUCOSE 172*  < > 101*  BUN 15  < > 10  CREATININE 0.87  < > 0.64  CALCIUM 7.2*  < > 6.9*  MG 0.7*  --   --   AST 49*  --   --   ALT 17  --   --   ALKPHOS 178*  --   --   BILITOT 2.5*  --   --   < > = values in this interval not displayed. ------------------------------------------------------------------------------------------------------------------  Cardiac Enzymes  Recent Labs Lab 08/21/15 0053  TROPONINI 0.03   ------------------------------------------------------------------------------------------------------------------  RADIOLOGY:  Dg Chest Portable 1 View  08/20/2015   CLINICAL DATA:  Found on floor.  EXAM: PORTABLE CHEST - 1 VIEW  COMPARISON:  06/06/2015  FINDINGS: The heart size and mediastinal contours are within normal limits. Both lungs are clear. The visualized skeletal structures are unremarkable.  IMPRESSION: No active disease.   Electronically Signed   By: Franchot Gallo M.D.   On: 08/20/2015 22:00    EKG:   Orders placed or performed during the hospital encounter of 08/20/15  . EKG 12-Lead  .  EKG 12-Lead  . EKG 12-Lead  . EKG 12-Lead    ASSESSMENT AND PLAN:  1.Recurrent  cdiff colitis;on High dose PO vanco,continue IV fluids,start regular duet,appreciate GI following.wbc coming down 2.multiple electrolyte abnormalities; due to  diarrhea;hypokalemia/hyponatrmia/;;replace k,mg,po4.,recheck labs am. 3.alcoholic cirrhosis 4.hypothyroidism; 5.GI and DVT pro[phylaxis   All the records are reviewed and case discussed with Care Management/Social Workerr. Management plans discussed with the patient, family and they are in agreement.  CODE STATUS:full  TOTAL TIME TAKING CARE OF THIS PATIENT: 35  minutes.   POSSIBLE D/C IN 3-4 DAYS, DEPENDING ON CLINICAL CONDITION.   Epifanio Lesches M.D on 08/21/2015 at 12:42 PM  Between 7am to 6pm - Pager - [phone]  After 6pm go to [email or web address] - password [email or web address] [facility]  [facility]  Office  [phone]  CC: Primary care physician; No PCP Per Patient

## 2015-08-21 NOTE — Consult Note (Signed)
GI Inpatient Consult Note  Reason for Consult: Severe recurrent  c.Diff   Attending Requesting Consult: Dr. Jannifer Franklin  History of Present Illness: Troy [name] is a 44 y.o. 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  He is being seen for multiple episodes of diarrhea and general weakness.   He reports that started a couple of weeks ago, at first not as many, and just loose stools and then yesterday he started having multiple episodes of diarrhea, at least 15-20 since yesterday.  He also reports extreme weakness, is unable to get up to use the bathroom.  He reports the last time he had been diagnosed with C diff he was not able to control his bowels.  He reports this time is different he feels taken control as bowels but is too weak to get up to use the bathroom.  He denies cramping, nausea, vomiting, abdominal pain.  He has not seen any other provider since he has been home from the hospital.  He has been hospitalized several times since the beginning of June 2016 for the same issues.  He was hospitalized June 06, 2015 through June 21, 2015 and then again July 01, 2015 through July 09, 2015.    his hospital course from June 22 to July 7: The patient was admitted for sepsis with diarrhea and dehydration. He was treated with the vancomycin and Zosyn for sepsis. In addition lactulose was discontinued. Stool C. difficile test is positive.  * C diff colitis.  He was treated with IV flagyl and PO vancomycin, but as he continuously have diarrhea on daily basis, so Flagyl and vancomycin were discontinued. Deficid and cholestyramine were started. The patient still has diarrhea multiple times a day, and Dr. Rayann Heman plans to get the stool transplant and endoscopy, but repeated C. difficile test were negative twice. Dr. Rayann Heman suggested no stool transplant or endoscopy at this time and started imodium 2 gr q8 hr and contiue dificid to  complete 14 day course. Diarrhea is getting better for the past 2 days.  * septic and hypovolemic shock - Resolved after IV fluid to support.  * Hypokalemia - improved on potassium supplements.  * Hypomagnesemia. The patient got Mag iv due to low magnesium level. Magnesium level today is 1.4. He was given IV magnesium 4 g 1 dose and need to continue by mouth magnesium twice a day, follow-up magnesium level as outpatient.  * Alcohol abuse. He was on CIWA, but no signs of withdrawal.  * Acute encephalopathy, Due to acute illness. Resolved. On Rifaximin.  * Hem positive blood with anemia of chronic disease - likely from c diff Hemoglobin decreased to 6.1, he got 2 units of PRBC transfusion, hemoglobin is stable. No endoscopy plan [[er GI.  * Acute renal failure due to ATN- Resolved after IV fluid to support.  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  * Lower legs weakness  He has neuropathy and atrophy for more than a year. follow with a neurologist as  Outpatient.  Continue PT.     His hospital course from July 17 2 July 06, 1541:  44 year old Caucasian male admitted for sepsis, weakness and diarrhea.  1. Sepsis: The patient met septic criteria on admission with tachycardi leukocytosis and diarrhea. 2 most recent stool studies negative for c. Dif, ova and parasites, culture negative. Colon aspirate from colonoscopy pending. Blood cultures negative. Stool culture normal flora D/c zosyn  2. Severe Weakness: Likely secondary to sepsis, dehydration and hypokalemia.Continue to replace electrolytes and fluids. Continue physical therapy. -to rehab in 1-2 days . 3. Diarrhea:  - Initially hospitalized 6/22 through 7/7 for diarrhea thought to be due to Clostridium difficile - Gastroenterology following. Colonoscopy on 7/21. Has had no further diarrhea since the procedure. colonoscopy showing diffuse inflammation he has been started on Solu-Medrol--->switch to po  prednsione. D/w dr Candace Cruise - C. difficile assay negative during this admission, stool culturenegative for potential pathogen, colonoscopy biopsy results pending. Pt will follow GI as ou pt  4. Alcohol withdrawal:  - No recent ativan needed.  5. Hip pain: sec to Avascular necrosis.  - Is been seen by orthopedics, Dr. Rudene Christians who is recommending OP Rehab  6. Acute renal failure:  - Likely ATN due to volume loss, high vanc levels and contrast exposurealso developing acidosis. - Nephrology has been consulted - recvd IV bicarbonate infusion - Renal ultrasound is normal, complement levels are pending, ANA is pending -creat trending down 2.05-->2.98-->2.89-->2.9-->2.8--->2.70 -good uop  7. Thrombocytopenia - Hold pharmacological DVT prophylaxis - No signs of bleeding  8. Anemia - Anemia panel ordered  9. Hypothyroidism - Has had persistently elevated TSH. Low-dose Synthroid started 7/22  7. DVT prophylaxis: SCDs 8. GI prophylaxis: protonix  Overall at baseline Pt advised to f/u with GI and nephrology as outpt D/c to [facility] today   His colonoscopy from July 05, 2015; Dr. Rayann Heman.  Indications; chronic diarrhea.  Impression ; suspect this is inflammatory bowel disease but need to perform stool cultures to rule out infection.  Non thrombosed external hemorrhoids found on perianal exam.  Altered vascular, erythematous and granular mucosa in the entire examined colon.  One 9 mm polyp in the sigmoid colon.  Resected and retrieved.  Injected.  The examination was otherwise normal on direct and retroflexion views.  The examined portion of the ileum was normal.  Multiple biopsies were obtained in the proximal transverse colon, at the hepatic flexure, in the ascending colon and in the cecum.  Multiple biopsies were obtained in the descending colon and in the  And distal transverse colon.  Recommendations; continue present medications.  Await pathology reports.  Start prednisone if biopsies confirm IBD.   Repeat colonoscopy in 6 months for polyp surveillance, prep today was not adequate for surveillance.   Pathology; right colon, abnormal mucosa:  Mild active colitis with architectural features of chronicity.  Negative for viral cytopathic effect, dysplasia, and malignancy.  Transverse colon, inflammation, mild active colitis.  Negative for viral cytopathic effects dysplasia and malignancy.  Colon polyp, sigmoid; tubular adenoma.  Negative for high-grade dysplasia and malignancy.  Rectum; colonic mucosa with focal lymphoid aggregate.  Negative for colitis, dysplasia, and malignancy.  The histologic findings in the colon biopsies could be compatible with early idiopathic inflammatory bowel disease, medication effect, or resolving infectious colitis.  Follow-up colonoscopy in 6 months recommended.   he had an upper endoscopy done as well.  July 05, 2015. Dr. Rayann Heman.  Indications ; anemia, follow up of esophageal varices, diarrhea.  Impression ; grade 2 esophageal varices.  Gastric ulcer which appeared to be healing.  Biopsied.  Normal examined duodenum.  Three biopsies were obtained in duodenal bulb and the second part of the duodenum.  Recommendation ; avoid NSAIDs, consider EUS of antral ulcer.  Start nadolol  all once diarrhea and renal  failure are resolved for grade 2 varices.  Pathology stomach, antral ulcer; antral mucosa with changes consistent with healing mucosal  injury.  Negative for H pylori, dysplasia, and malignancy.  Past Medical History:  Past Medical History  Diagnosis Date  . Liver damage   . Hypertension   . Neuropathy   . Seizures     complicated ETOH withdrawal  . Pancytopenia, acquired   . Ascites due to alcoholic cirrhosis   . DTs (delirium tremens)   . Hypothyroidism   . Hepatitis C   . C. difficile diarrhea     Problem List: Patient Active Problem List   Diagnosis Date Noted  . Pressure ulcer 08/21/2015  . Alcohol abuse 08/20/2015  . HTN (hypertension) 08/20/2015  .  Hypothyroidism (acquired) 08/20/2015  . GERD (gastroesophageal reflux disease) 08/20/2015  . Hypocalcemia 06/09/2015  . Protein-calorie malnutrition, severe 06/09/2015  . Hypokalemia 06/07/2015  . Enteritis due to Clostridium difficile 06/07/2015  . Anemia of chronic disease 06/07/2015  . Hyponatremia 06/07/2015  . Sepsis 06/06/2015  . Acute renal failure 06/06/2015  . Alcohol withdrawal seizure 05/13/2015  . Lactic acidosis 05/13/2015  . Leukocytosis 05/13/2015    Past Surgical History: Past Surgical History  Procedure Laterality Date  . Joint replacement      Left hip replacement  . Esophagogastroduodenoscopy (egd) with propofol N/A 07/05/2015    Procedure: ESOPHAGOGASTRODUODENOSCOPY (EGD) WITH PROPOFOL;  Surgeon: Josefine Class, MD;  Location: [facility] ENDOSCOPY;  Service: Endoscopy;  Laterality: N/A;  . Colonoscopy with propofol N/A 07/05/2015    Procedure: COLONOSCOPY WITH PROPOFOL;  Surgeon: Josefine Class, MD;  Location: [facility] ENDOSCOPY;  Service: Endoscopy;  Laterality: N/A;    Allergies: No Known Allergies  Home Medications: Prescriptions prior to admission  Medication Sig Dispense Refill Last Dose  . folic acid (FOLVITE) 1 MG tablet Take 1 tablet (1 mg total) by mouth daily. 30 tablet 0 08/20/2015 at Unknown time  . furosemide (LASIX) 40 MG tablet Take 1 tablet (40 mg total) by mouth daily. 30 tablet 0 08/20/2015 at Unknown time  . HYDROcodone-acetaminophen (NORCO/VICODIN) 5-325 MG per tablet Take 1 tablet by mouth 3 (three) times daily as needed for moderate pain. 30 tablet 0 Past Month at Unknown time  . levothyroxine (SYNTHROID, LEVOTHROID) 25 MCG tablet Take 1 tablet (25 mcg total) by mouth daily before breakfast. 30 tablet 0 08/20/2015 at Unknown time  . loperamide (IMODIUM) 2 MG capsule Take 1 capsule (2 mg total) by mouth 3 (three) times daily. 30 capsule 0 Past Week at Unknown time  . magnesium 30 MG tablet Take 1 tablet (30 mg total) by mouth 2 (two) times daily. 14  tablet 0 08/20/2015 at Unknown time  . magnesium oxide (MAG-OX) 400 (241.3 MG) MG tablet Take 1 tablet (400 mg total) by mouth daily. 30 tablet 0 08/20/2015 at Unknown time  . nadolol (CORGARD) 40 MG tablet Take 1 tablet (40 mg total) by mouth daily. 30 tablet 0 08/20/2015 at Unknown time  . pantoprazole (PROTONIX) 40 MG tablet Take 1 tablet (40 mg total) by mouth daily. 60 tablet 0 08/20/2015 at Unknown time  . potassium chloride SA (K-DUR,KLOR-CON) 20 MEQ tablet Take 2 tablets (40 mEq total) by mouth 2 (two) times daily. 20 tablet 0 08/20/2015 at Unknown time  . predniSONE (DELTASONE) 20 MG tablet Take 2 tablets (40 mg total) by mouth daily with breakfast. 30 tablet 0 Past Month at Unknown time  . pregabalin (LYRICA) 150 MG capsule Take 150 mg by mouth 2 (two) times daily as needed (for nerve pain).   08/20/2015 at Unknown time  . QUEtiapine (SEROQUEL) 50 MG  tablet Take 50 mg by mouth at bedtime as needed (for sleep).   Past Month at Unknown time  . saccharomyces boulardii (FLORASTOR) 250 MG capsule Take 1 capsule (250 mg total) by mouth 2 (two) times daily. 20 capsule 0 Past Week at Unknown time  . thiamine 100 MG tablet Take 1 tablet (100 mg total) by mouth daily. 30 tablet 0 Past Week at Unknown time  . zinc oxide 20 % ointment Apply topically 2 (two) times daily as needed for irritation. Apply to button 56.7 g 0 Past Month at Unknown time   Home medication reconciliation was completed with the patient.   Scheduled Inpatient Medications:   . calcium gluconate  1 g Intravenous Once  . folic acid  1 mg Oral Daily  . levothyroxine  25 mcg Oral QAC breakfast  . magnesium sulfate 1 - 4 g bolus IVPB  2 g Intravenous Once  . metronidazole  500 mg Intravenous Q8H  . multivitamin with minerals  1 tablet Oral Daily  . nadolol  40 mg Oral Daily  . potassium & sodium phosphates  2 packet Oral TID WC & HS  . saccharomyces boulardii  250 mg Oral BID  . sodium chloride  3 mL Intravenous Q12H  . thiamine  100 mg  Oral Daily   Or  . thiamine  100 mg Intravenous Daily  . vancomycin  125 mg Oral 4 times per day    Continuous Inpatient Infusions:   . sodium chloride 150 mL/hr at 08/21/15 1031    PRN Inpatient Medications:  acetaminophen **OR** acetaminophen, liver oil-zinc oxide, LORazepam **OR** LORazepam, magnesium oxide, ondansetron **OR** ondansetron (ZOFRAN) IV, QUEtiapine  Family History: family history includes Crohn's disease in his father.    Social History:   reports that he has been smoking Cigarettes.  He has a 10 pack-year smoking history. He does not have any smokeless tobacco history on file. He reports that he drinks about 21.6 oz of alcohol per week.   Review of Systems: Constitutional: Weight is stable.  Eyes: No changes in vision. ENT: No oral lesions, sore throat.  GI: see HPI.  Heme/Lymph: No easy bruising.  CV: No chest pain.  GU: No hematuria.  Integumentary: No rashes.  Neuro: No headaches.  Psych: No depression/anxiety.  Endocrine: No heat/cold intolerance.  Allergic/Immunologic: No urticaria.  Resp: No cough, SOB.  Musculoskeletal: No joint swelling.    Physical Examination: BP 119/71 mmHg  Pulse 109  Temp(Src) 98.3 F (36.8 C) (Oral)  Resp 18  Ht 5' 8"  (1.727 m)  Wt 98.839 kg (217 lb 14.4 oz)  BMI 33.14 kg/m2  SpO2 100% Gen: NAD, alert and oriented x 4 HEENT: PEERLA, EOMI, Neck: supple, no JVD or thyromegaly Chest: CTA bilaterally, no wheezes, crackles, or other adventitious sounds CV: RRR, no m/g/c/r Abd: soft, NT, ND, +BS in all four quadrants; no HSM, guarding, ridigity, or rebound tenderness Ext: no edema, well perfused with 2+ pulses, Skin: no rash or lesions noted Lymph: no LAD  Data: Lab Results  Component Value Date   WBC 14.5* 08/21/2015   HGB 9.4* 08/21/2015   HCT 27.6* 08/21/2015   MCV 92.7 08/21/2015   PLT 64* 08/21/2015    Recent Labs Lab 08/20/15 1945 08/21/15 0053  HGB 11.5* 9.4*   Lab Results  Component Value Date    NA 134* 08/21/2015   K 3.0* 08/21/2015   CL 105 08/21/2015   CO2 19* 08/21/2015   BUN 8 08/21/2015   CREATININE 0.58*  08/21/2015   Lab Results  Component Value Date   ALT 17 08/20/2015   AST 49* 08/20/2015   ALKPHOS 178* 08/20/2015   BILITOT 2.5* 08/20/2015   No results for input(s): APTT, INR, PTT in the last 168 hours. Assessment/Plan: Mr. Dahlem is a 44 y.o. male with recurrent c.Diff infection, generalized weakness  Recommendations: We recommend serum immunoelectrophoresis, stool occult cards.  Please see Dr. Percell Boston note for further recommendations.  Thank you for the consult. Please call with questions or concerns.  Salvadore Farber, PA-C  I personally performed these services.

## 2015-08-21 NOTE — Progress Notes (Signed)
Dr. Marcille Blanco notified of critical troponin 0.04 and K+ of 2.6; Mykell Genao K, RN;1:44 AM; 08/21/2015

## 2015-08-22 LAB — BASIC METABOLIC PANEL
ANION GAP: 9 (ref 5–15)
ANION GAP: 6 (ref 5–15)
BUN: 6 mg/dL (ref 6–20)
BUN: 5 mg/dL — AB (ref 6–20)
CHLORIDE: 108 mmol/L (ref 101–111)
CHLORIDE: 111 mmol/L (ref 101–111)
CO2: 21 mmol/L — AB (ref 22–32)
CO2: 21 mmol/L — AB (ref 22–32)
CREATININE: 0.65 mg/dL (ref 0.61–1.24)
Calcium: 7.1 mg/dL — ABNORMAL LOW (ref 8.9–10.3)
Calcium: 7.2 mg/dL — ABNORMAL LOW (ref 8.9–10.3)
Creatinine, Ser: 0.57 mg/dL — ABNORMAL LOW (ref 0.61–1.24)
GFR calc Af Amer: >60 mL/min (ref >60)
GFR calc non Af Amer: >60 mL/min (ref >60)
GFR calc non Af Amer: >60 mL/min (ref >60)
GLUCOSE: 115 mg/dL — AB (ref 65–99)
Glucose, Bld: 135 mg/dL — ABNORMAL HIGH (ref 65–99)
POTASSIUM: 2.7 mmol/L — AB (ref 3.5–5.1)
POTASSIUM: 3.1 mmol/L — AB (ref 3.5–5.1)
Sodium: 138 mmol/L (ref 135–145)
Sodium: 138 mmol/L (ref 135–145)

## 2015-08-22 LAB — PHOSPHORUS
Phosphorus: 1.7 mg/dL — ABNORMAL LOW (ref 2.5–4.6)
Phosphorus: 3 mg/dL (ref 2.5–4.6)

## 2015-08-22 LAB — POTASSIUM: POTASSIUM: 3.2 mmol/L — AB (ref 3.5–5.1)

## 2015-08-22 LAB — CBC
HEMATOCRIT: 25.9 % — AB (ref 40.0–52.0)
HEMOGLOBIN: 8.8 g/dL — AB (ref 13.0–18.0)
MCH: 32.5 pg (ref 26.0–34.0)
MCHC: 34.1 g/dL (ref 32.0–36.0)
MCV: 95.3 fL (ref 80.0–100.0)
Platelets: 51 10*3/uL — ABNORMAL LOW (ref 150–440)
RBC: 2.72 MIL/uL — AB (ref 4.40–5.90)
RDW: 15.4 % — ABNORMAL HIGH (ref 11.5–14.5)
WBC: 5.7 10*3/uL (ref 3.8–10.6)

## 2015-08-22 LAB — MAGNESIUM
MAGNESIUM: 2.5 mg/dL — AB (ref 1.7–2.4)
Magnesium: 2.2 mg/dL (ref 1.7–2.4)

## 2015-08-22 MED ORDER — POTASSIUM CHLORIDE 20 MEQ PO PACK
40.0000 meq | PACK | ORAL | Status: AC
  Administered 2015-08-22 (×2): 40 meq via ORAL
  Filled 2015-08-22 (×2): qty 2

## 2015-08-22 MED ORDER — POTASSIUM PHOSPHATES 15 MMOLE/5ML IV SOLN
20.0000 mmol | Freq: Once | INTRAVENOUS | Status: AC
  Administered 2015-08-22: 20 mmol via INTRAVENOUS
  Filled 2015-08-22: qty 6.67

## 2015-08-22 NOTE — Consult Note (Signed)
Pt actually has a third bout of C. Diff and should therefore have a stool transplant to prevent a repeat spell,  This should be done after a 12-14 day course of vancomycin and may be done as an out patient.  Will follow with you and arrange this to be done after his hospitalization.  Discussed with patient

## 2015-08-22 NOTE — Clinical Documentation Improvement (Signed)
Hospitalist  Do you agree with the diagnosis of Shock, if so please provide specificity:    Septic  Hypovolemia  No shock  Other type, please specify: _______________  Clinically Undetermined  Document any associated diagnoses/conditions. Per consult note 9/6: "septic and hypovolemic shock  Resolved after IV fluid to support"  Please exercise your independent, professional judgment when responding. A specific answer is not anticipated or expected.   Thank You,  Franky Macho, RN [place] [phone]

## 2015-08-22 NOTE — Progress Notes (Addendum)
Lab Called around 3:15am with a critical potassium lab value of 2.7. Dr. Marcille Blanco notified and made aware that potassium had went up from 2.4 to 2.7 and he stated ok just let it trend up no new orders.

## 2015-08-22 NOTE — Progress Notes (Signed)
[place] at [employer] NAME: Troy [name]    MR#:  [id number]  DATE OF BIRTH:  1971/07/08 Admitted for severe diaarea,recurrent  Cdiff.cloitis. In today, says since the diarrhea is back again and it is watery. No nausea, no abdominal pain. Tolerating the diet.  SUBJECTIVE:  CHIEF COMPLAINT:   Chief Complaint  Patient presents with  . Diarrhea    REVIEW OF SYSTEMS:   ROS CONSTITUTIONAL: No fever, fatigue or weakness.  EYES: No blurred or double vision.  EARS, NOSE, AND THROAT: No tinnitus or ear pain.  RESPIRATORY: No cough, shortness of breath, wheezing or hemoptysis.  CARDIOVASCULAR: No chest pain, orthopnea, edema.  GASTROINTESTINAL: No nausea, vomiting, diarrhea or abdominal pain.  GENITOURINARY: No dysuria, hematuria.  ENDOCRINE: No polyuria, nocturia,  HEMATOLOGY: No anemia, easy bruising or bleeding SKIN: No rash or lesion. MUSCULOSKELETAL: No joint pain or arthritis.   NEUROLOGIC: No tingling, numbness, weakness.  PSYCHIATRY: No anxiety or depression.   DRUG ALLERGIES:  No Known Allergies  VITALS:  Blood pressure 98/70, pulse 88, temperature 97.8 F (36.6 C), temperature source Oral, resp. rate 20, height 5\' 8"  (1.727 m), weight 98.839 kg (217 lb 14.4 oz), SpO2 100 %.  PHYSICAL EXAMINATION:  GENERAL:  44 y.o.-year-old patient lying in the bed with no acute distress.  EYES: Pupils equal, round, reactive to light and accommodation. No scleral icterus. Extraocular muscles intact.  HEENT: Head atraumatic, normocephalic. Oropharynx and nasopharynx clear.  NECK:  Supple, no jugular venous distention. No thyroid enlargement, no tenderness.  LUNGS: Normal breath sounds bilaterally, no wheezing, rales,rhonchi or crepitation. No use of accessory muscles of respiration.  CARDIOVASCULAR: S1, S2 normal. No murmurs, rubs, or gallops.  ABDOMEN: Soft, nontender, nondistended. Bowel sounds present. No organomegaly or mass.   EXTREMITIES: No pedal edema, cyanosis, or clubbing.  NEUROLOGIC: Cranial nerves II through XII are intact. Muscle strength 5/5 in all extremities. Sensation intact. Gait not checked.  PSYCHIATRIC: The patient is alert and oriented x 3.  SKIN: No obvious rash, lesion, or ulcer.    LABORATORY PANEL:   CBC  Recent Labs Lab 08/22/15 0428  WBC 5.7  HGB 8.8*  HCT 25.9*  PLT 51*   ------------------------------------------------------------------------------------------------------------------  Chemistries   Recent Labs Lab 08/20/15 1945  08/22/15 0428  NA 124*  < > 138  K 2.5*  < > 3.1*  CL 86*  < > 111  CO2 22  < > 21*  GLUCOSE 172*  < > 115*  BUN 15  < > 5*  CREATININE 0.87  < > 0.57*  CALCIUM 7.2*  < > 7.2*  MG 0.7*  < > 2.2  AST 49*  --   --   ALT 17  --   --   ALKPHOS 178*  --   --   BILITOT 2.5*  --   --   < > = values in this interval not displayed. ------------------------------------------------------------------------------------------------------------------  Cardiac Enzymes  Recent Labs Lab 08/21/15 0053  TROPONINI 0.03   ------------------------------------------------------------------------------------------------------------------  RADIOLOGY:  Dg Chest Portable 1 View  08/20/2015   CLINICAL DATA:  Found on floor.  EXAM: PORTABLE CHEST - 1 VIEW  COMPARISON:  06/06/2015  FINDINGS: The heart size and mediastinal contours are within normal limits. Both lungs are clear. The visualized skeletal structures are unremarkable.  IMPRESSION: No active disease.   Electronically Signed   By: Franchot Gallo M.D.   On: 08/20/2015 22:00    EKG:   Orders placed  or performed during the hospital encounter of 08/20/15  . EKG 12-Lead  . EKG 12-Lead  . EKG 12-Lead  . EKG 12-Lead    ASSESSMENT AND PLAN:  1.Recurrent  cdiff colitis;on High dose PO vanco, IV Flagyl. Seen by GI, checking the gammaglobulin levels to see if he is well-nourished and we will infuse IV,  globulins if needed. May need the stool transplant due to recurrence. Appreciate GI following. Continue probiotic./pt was on dificid before.. 2.multiple electrolyte abnormalities; due to diarrhea;hypokalemia/hyponatrmia/;;replace, recheck.  k,mg,po4.,recheck labs am. 3.alcoholic cirrhosis 4.hypothyroidism; 5.GI and DVT pro[phylaxis   All the records are reviewed and case discussed with Care Management/Social Workerr. Management plans discussed with the patient, family and they are in agreement.  CODE STATUS:full  TOTAL TIME TAKING CARE OF THIS PATIENT: 35  minutes.   POSSIBLE D/C IN 3-4 DAYS, DEPENDING ON CLINICAL CONDITION.   Epifanio Lesches M.D on 08/22/2015 at 11:31 AM  Between 7am to 6pm - Pager - [phone]  After 6pm go to [email or web address] - password [email or web address] [facility]  [place] [facility]  Office  [phone]  CC: Primary care physician; No PCP Per Patient

## 2015-08-22 NOTE — Clinical Documentation Improvement (Signed)
Hospitalist  Please clarify if the following diagnosis, **sepsis** was:   Present at the time of admission (POA)  Suspected on admission but ruled out  NOT present at the time of admission and it developed during the inpatient stay  Unable to clinically determine whether the condition was present on admission.  Unknown   Supporting Information: *Per ED Provider: "he does meet sepsis criteria but I do not yet have a source" *Per consult note 9/6: "The patient ws admitted for sepsis and diarrhea and dehydration." "The patient met septic criteria on admission with tachycardia leukocytosis and diarrhea"*   Please exercise your independent, professional judgment when responding. A specific answer is not anticipated or expected.   Thank You,  Franky Macho, RN [place] [phone]

## 2015-08-22 NOTE — Progress Notes (Signed)
Pharmacy Consult for electrolyte replacement Indication: electrolyte abnormalities due to diarrhea  No Known Allergies  Patient Measurements: Height: 5\' 8"  (172.7 cm) Weight: 217 lb 14.4 oz (98.839 kg) IBW/kg (Calculated) : 68.4   Vital Signs: Temp: 98.4 F (36.9 C) (09/07 1638) Temp Source: Oral (09/07 1638) BP: 108/62 mmHg (09/07 1638) Pulse Rate: 78 (09/07 1638) Intake/Output from previous day:   Intake/Output from this shift:    Labs:  Recent Labs  08/20/15 1945 08/21/15 0053 08/22/15 0428  WBC 22.9* 14.5* 5.7  HGB 11.5* 9.4* 8.8*  HCT 33.0* 27.6* 25.9*  PLT 86* 64* 51*     Recent Labs  08/20/15 1945  08/21/15 1946 08/22/15 0130 08/22/15 0428 08/22/15 2001  NA 124*  < > 133* 138 138  --   K 2.5*  < > 2.4* 2.7* 3.1* 3.2*  CL 86*  < > 104 108 111  --   CO2 22  < > 21* 21* 21*  --   GLUCOSE 172*  < > 104* 135* 115*  --   BUN 15  < > 7 6 5*  --   CREATININE 0.87  < > 0.75 0.65 0.57*  --   CALCIUM 7.2*  < > 6.9* 7.1* 7.2*  --   MG 0.7*  --   --  2.5* 2.2  --   PHOS 1.2*  --   --   --  1.7* 3.0  PROT 6.3*  --   --   --   --   --   ALBUMIN 3.1*  --   --   --   --   --   AST 49*  --   --   --   --   --   ALT 17  --   --   --   --   --   ALKPHOS 178*  --   --   --   --   --   BILITOT 2.5*  --   --   --   --   --   < > = values in this interval not displayed. Estimated Creatinine Clearance: 132.9 mL/min (by C-G formula based on Cr of 0.57).   No results for input(s): GLUCAP in the last 72 hours.  Medical History: Past Medical History  Diagnosis Date  . Liver damage   . Hypertension   . Neuropathy   . Seizures     complicated ETOH withdrawal  . Pancytopenia, acquired   . Ascites due to alcoholic cirrhosis   . DTs (delirium tremens)   . Hypothyroidism   . Hepatitis C   . C. difficile diarrhea    Assessment: Patient with recurrent C. Diff and electrolyte abnormalities due to severe diarrhea. Pharmacy consulted to manage electrolytes.   Plan:   Potassium and phosphorus low, patient received 40 mEq IV KCl this morning. Will give KPhos 28mmol IV x 1and recheck potassium and phos this evening. BMP tomorrow AM.   9/07:  Potassium @ 20:00 = 3.2  Will order KCl 40 mEq PO X 2 doses and recheck K with AM labs on 9/08.   Violeta Gelinas, PharmD Clinical Pharmacist  08/22/2015,9:05 PM

## 2015-08-22 NOTE — Progress Notes (Signed)
Pharmacy Consult for electrolyte replacement Indication: electrolyte abnormalities due to diarrhea  No Known Allergies  Patient Measurements: Height: 5\' 8"  (172.7 cm) Weight: 217 lb 14.4 oz (98.839 kg) IBW/kg (Calculated) : 68.4   Vital Signs: Temp: 97.8 F (36.6 C) (09/07 0816) Temp Source: Oral (09/07 0816) BP: 98/70 mmHg (09/07 0816) Pulse Rate: 88 (09/07 0816) Intake/Output from previous day:   Intake/Output from this shift:    Labs:  Recent Labs  08/20/15 1945 08/21/15 0053 08/22/15 0428  WBC 22.9* 14.5* 5.7  HGB 11.5* 9.4* 8.8*  HCT 33.0* 27.6* 25.9*  PLT 86* 64* 51*     Recent Labs  08/20/15 1945  08/21/15 1946 08/22/15 0130 08/22/15 0428  NA 124*  < > 133* 138 138  K 2.5*  < > 2.4* 2.7* 3.1*  CL 86*  < > 104 108 111  CO2 22  < > 21* 21* 21*  GLUCOSE 172*  < > 104* 135* 115*  BUN 15  < > 7 6 5*  CREATININE 0.87  < > 0.75 0.65 0.57*  CALCIUM 7.2*  < > 6.9* 7.1* 7.2*  MG 0.7*  --   --  2.5* 2.2  PHOS 1.2*  --   --   --  1.7*  PROT 6.3*  --   --   --   --   ALBUMIN 3.1*  --   --   --   --   AST 49*  --   --   --   --   ALT 17  --   --   --   --   ALKPHOS 178*  --   --   --   --   BILITOT 2.5*  --   --   --   --   < > = values in this interval not displayed. Estimated Creatinine Clearance: 132.9 mL/min (by C-G formula based on Cr of 0.57).   No results for input(s): GLUCAP in the last 72 hours.  Medical History: Past Medical History  Diagnosis Date  . Liver damage   . Hypertension   . Neuropathy   . Seizures     complicated ETOH withdrawal  . Pancytopenia, acquired   . Ascites due to alcoholic cirrhosis   . DTs (delirium tremens)   . Hypothyroidism   . Hepatitis C   . C. difficile diarrhea    Assessment: Patient with recurrent C. Diff and electrolyte abnormalities due to severe diarrhea. Pharmacy consulted to manage electrolytes.   Plan:  Potassium and phosphorus low, patient received 40 mEq IV KCl this morning. Will give KPhos  81mmol IV x 1and recheck potassium and phos this evening. BMP tomorrow AM.   Pharmacy to follow per consult  Rexene Edison, PharmD Clinical Pharmacist  08/22/2015,1:35 PM

## 2015-08-23 LAB — IMMUNOGLOBULINS A/E/G/M, SERUM
IGE (IMMUNOGLOBULIN E), SERUM: 283 [IU]/mL — AB (ref 0–100)
IgA: 138 mg/dL (ref 90–386)
IgG (Immunoglobin G), Serum: 614 mg/dL — ABNORMAL LOW (ref 700–1600)
IgM, Serum: 163 mg/dL (ref 20–172)

## 2015-08-23 LAB — BASIC METABOLIC PANEL
ANION GAP: 6 (ref 5–15)
BUN: <5 mg/dL — ABNORMAL LOW (ref 6–20)
CALCIUM: 7.2 mg/dL — AB (ref 8.9–10.3)
CO2: 21 mmol/L — ABNORMAL LOW (ref 22–32)
Chloride: 115 mmol/L — ABNORMAL HIGH (ref 101–111)
Creatinine, Ser: 0.46 mg/dL — ABNORMAL LOW (ref 0.61–1.24)
GLUCOSE: 91 mg/dL (ref 65–99)
POTASSIUM: 3.3 mmol/L — AB (ref 3.5–5.1)
Sodium: 142 mmol/L (ref 135–145)

## 2015-08-23 LAB — URINE CULTURE: SPECIAL REQUESTS: NORMAL

## 2015-08-23 LAB — MAGNESIUM: Magnesium: 1.3 mg/dL — ABNORMAL LOW (ref 1.7–2.4)

## 2015-08-23 LAB — PHOSPHORUS: Phosphorus: 2.9 mg/dL (ref 2.5–4.6)

## 2015-08-23 MED ORDER — CALCIUM GLUCONATE 10 % IV SOLN
1.0000 g | Freq: Once | INTRAVENOUS | Status: AC
  Administered 2015-08-23: 1 g via INTRAVENOUS
  Filled 2015-08-23: qty 10

## 2015-08-23 MED ORDER — POTASSIUM CHLORIDE CRYS ER 20 MEQ PO TBCR
40.0000 meq | EXTENDED_RELEASE_TABLET | Freq: Two times a day (BID) | ORAL | Status: AC
  Administered 2015-08-23 (×2): 40 meq via ORAL
  Filled 2015-08-23 (×2): qty 2

## 2015-08-23 MED ORDER — PREGABALIN 75 MG PO CAPS
150.0000 mg | ORAL_CAPSULE | Freq: Two times a day (BID) | ORAL | Status: DC
  Administered 2015-08-24 – 2015-08-29 (×12): 150 mg via ORAL
  Filled 2015-08-23 (×12): qty 2

## 2015-08-23 MED ORDER — DEXTROSE 5 % IV SOLN
1.0000 g | INTRAVENOUS | Status: DC
  Administered 2015-08-23 – 2015-08-26 (×3): 1 g via INTRAVENOUS
  Filled 2015-08-23 (×6): qty 10

## 2015-08-23 MED ORDER — MAGNESIUM SULFATE 4 GM/100ML IV SOLN
4.0000 g | Freq: Once | INTRAVENOUS | Status: AC
  Administered 2015-08-23: 4 g via INTRAVENOUS
  Filled 2015-08-23: qty 100

## 2015-08-23 NOTE — Progress Notes (Signed)
[place] at [place] NAME: Troy [name]    MR#:  [id number]  DATE OF BIRTH:  [birth year] Diarrhea is better. No nausea and abdominal pain. SUBJECTIVE:  CHIEF COMPLAINT:   Chief Complaint  Patient presents with  . Diarrhea    REVIEW OF SYSTEMS:   ROS CONSTITUTIONAL: No fever, fatigue or weakness.  EYES: No blurred or double vision.  EARS, NOSE, AND THROAT: No tinnitus or ear pain.  RESPIRATORY: No cough, shortness of breath, wheezing or hemoptysis.  CARDIOVASCULAR: No chest pain, orthopnea, edema.  GASTROINTESTINAL: No nausea, vomiting, diarrhea or abdominal pain.  GENITOURINARY: No dysuria, hematuria.  ENDOCRINE: No polyuria, nocturia,  HEMATOLOGY: No anemia, easy bruising or bleeding SKIN: No rash or lesion. MUSCULOSKELETAL: No joint pain or arthritis.   NEUROLOGIC: No tingling, numbness, weakness.  PSYCHIATRY: No anxiety or depression.   DRUG ALLERGIES:  No Known Allergies  VITALS:  Blood pressure 92/47, pulse 92, temperature 98.2 F (36.8 C), temperature source Oral, resp. rate 17, height 5\' 8"  (1.727 m), weight 98.839 kg (217 lb 14.4 oz), SpO2 100 %.  PHYSICAL EXAMINATION:  GENERAL:  44 y.o.-year-old patient lying in the bed with no acute distress.  EYES: Pupils equal, round, reactive to light and accommodation. No scleral icterus. Extraocular muscles intact.  HEENT: Head atraumatic, normocephalic. Oropharynx and nasopharynx clear.  NECK:  Supple, no jugular venous distention. No thyroid enlargement, no tenderness.  LUNGS: Normal breath sounds bilaterally, no wheezing, rales,rhonchi or crepitation. No use of accessory muscles of respiration.  CARDIOVASCULAR: S1, S2 normal. No murmurs, rubs, or gallops.  ABDOMEN: Soft, nontender, nondistended. Bowel sounds present. No organomegaly or mass.  EXTREMITIES: No pedal edema, cyanosis, or clubbing.  NEUROLOGIC: Cranial nerves II through XII are intact. Muscle strength 5/5 in all  extremities. Sensation intact. Gait not checked.  PSYCHIATRIC: The patient is alert and oriented x 3.  SKIN: No obvious rash, lesion, or ulcer.    LABORATORY PANEL:   CBC  Recent Labs Lab 08/22/15 0428  WBC 5.7  HGB 8.8*  HCT 25.9*  PLT 51*   ------------------------------------------------------------------------------------------------------------------  Chemistries   Recent Labs Lab 08/20/15 1945  08/23/15 0445  NA 124*  < > 142  K 2.5*  < > 3.3*  CL 86*  < > 115*  CO2 22  < > 21*  GLUCOSE 172*  < > 91  BUN 15  < > <5*  CREATININE 0.87  < > 0.46*  CALCIUM 7.2*  < > 7.2*  MG 0.7*  < > 1.3*  AST 49*  --   --   ALT 17  --   --   ALKPHOS 178*  --   --   BILITOT 2.5*  --   --   < > = values in this interval not displayed. ------------------------------------------------------------------------------------------------------------------  Cardiac Enzymes  Recent Labs Lab 08/21/15 0053  TROPONINI 0.03   ------------------------------------------------------------------------------------------------------------------  RADIOLOGY:  No results found.  EKG:   Orders placed or performed during the hospital encounter of 08/20/15  . EKG 12-Lead  . EKG 12-Lead  . EKG 12-Lead  . EKG 12-Lead    ASSESSMENT AND PLAN:  1.Recurrent  cdiff colitis;on  PO vanco, IV Flagyl. Seen by GI, checking the gammaglobulin levels to see if he is malnourished and we will infuse IV, globulins if needed. May need the stool transplant due to recurrence. Appreciate GI following. Continue probiotic./pt was on dificid before.. Patient diminished transplant as an outpatient after 10 -14days of  treatment for present  Treatment,  Sepsis present on admission secondary to C. difficile colitis   2.multiple electrolyte abnormalities; due to diarrhea;hypokalemia/hyponatrmia/;;replace, recheck.  k,mg,po4.,recheck labs am. 3.alcoholic cirrhosis 4.hypothyroidism; 5.GI and DVT pro[phylaxis 6. .  Escherichia coli UTI; started on Rocephin.  All the records are reviewed and case discussed with Care Management/Social Workerr. Management plans discussed with the patient, family and they are in agreement.  CODE STATUS:full  TOTAL TIME TAKING CARE OF THIS PATIENT: 35  minutes.   POSSIBLE D/C IN 3-4 DAYS, DEPENDING ON CLINICAL CONDITION.   Epifanio Lesches M.D on 08/23/2015 at 12:20 PM  Between 7am to 6pm - Pager - [phone]  After 6pm go to [email or web address] - password [email or web address] [facility]  [place] [facility]  Office  [phone]  CC: Primary care physician; No PCP Per Patient

## 2015-08-23 NOTE — Progress Notes (Signed)
Pharmacy Consult for electrolyte replacement Indication: electrolyte abnormalities due to diarrhea  No Known Allergies  Patient Measurements: Height: 5\' 8"  (172.7 cm) Weight: 217 lb 14.4 oz (98.839 kg) IBW/kg (Calculated) : 68.4   Vital Signs: Temp: 98.2 F (36.8 C) (09/08 0037) Temp Source: Oral (09/08 0037) BP: 102/63 mmHg (09/08 0037) Pulse Rate: 97 (09/08 0037) Intake/Output from previous day: 09/07 0701 - 09/08 0700 In: 243 [P.O.:240; I.V.:3] Out: -  Intake/Output from this shift: Total I/O In: 243 [P.O.:240; I.V.:3] Out: -   Labs:  Recent Labs  08/20/15 1945 08/21/15 0053 08/22/15 0428  WBC 22.9* 14.5* 5.7  HGB 11.5* 9.4* 8.8*  HCT 33.0* 27.6* 25.9*  PLT 86* 64* 51*     Recent Labs  08/20/15 1945  08/22/15 0130 08/22/15 0428 08/22/15 2001 08/23/15 0445  NA 124*  < > 138 138  --  142  K 2.5*  < > 2.7* 3.1* 3.2* 3.3*  CL 86*  < > 108 111  --  115*  CO2 22  < > 21* 21*  --  21*  GLUCOSE 172*  < > 135* 115*  --  91  BUN 15  < > 6 5*  --  <5*  CREATININE 0.87  < > 0.65 0.57*  --  0.46*  CALCIUM 7.2*  < > 7.1* 7.2*  --  7.2*  MG 0.7*  --  2.5* 2.2  --  1.3*  PHOS 1.2*  --   --  1.7* 3.0 2.9  PROT 6.3*  --   --   --   --   --   ALBUMIN 3.1*  --   --   --   --   --   AST 49*  --   --   --   --   --   ALT 17  --   --   --   --   --   ALKPHOS 178*  --   --   --   --   --   BILITOT 2.5*  --   --   --   --   --   < > = values in this interval not displayed. Estimated Creatinine Clearance: 132.9 mL/min (by C-G formula based on Cr of 0.46).   No results for input(s): GLUCAP in the last 72 hours.  Medical History: Past Medical History  Diagnosis Date  . Liver damage   . Hypertension   . Neuropathy   . Seizures     complicated ETOH withdrawal  . Pancytopenia, acquired   . Ascites due to alcoholic cirrhosis   . DTs (delirium tremens)   . Hypothyroidism   . Hepatitis C   . C. difficile diarrhea    Assessment: Patient with recurrent C. Diff and  electrolyte abnormalities due to severe diarrhea. Pharmacy consulted to manage electrolytes.   Plan:  Potassium and phosphorus low, patient received 40 mEq IV KCl this morning. Will give KPhos 59mmol IV x 1and recheck potassium and phos this evening. BMP tomorrow AM.   9/07:  Potassium @ 20:00 = 3.2  Will order KCl 40 mEq PO X 2 doses and recheck K with AM labs on 9/08.   9/8 Mg 1.3, K+ 3.3 and Ca 7.2. KCl 40 mEq PO x 2 doses, magnesium 4 grams and calcium gluconate 1 gram ordered. F/u labs in AM.  Sim Boast, PharmD, BCPS  08/23/2015

## 2015-08-23 NOTE — Consult Note (Signed)
Pt diarrhea is better, had 2 last nite and 5-6 today.  He is on oral vancomycin.  His WBC has gone from 22.9 to 5.7.  He did have a UTI and C. Diff and is being treated for both at this time.  He has a slightly low lever of IgG of 614 compared to low normal of 700.  He is responding with clinical improvement so I don;t think he needs a gamma globulin infusion at this time but if there is clinical worsening would consider it.  Hopefully he will respond to the vancomycin and will get a stool transplant as out patient after 14 days of vancomycin after all other antibiotics are finished.  When he goes home needs to see me in follow up within 1 week.

## 2015-08-24 LAB — MAGNESIUM: Magnesium: 1.3 mg/dL — ABNORMAL LOW (ref 1.7–2.4)

## 2015-08-24 LAB — BASIC METABOLIC PANEL
ANION GAP: 5 (ref 5–15)
BUN: 7 mg/dL (ref 6–20)
CALCIUM: 7.5 mg/dL — AB (ref 8.9–10.3)
CHLORIDE: 116 mmol/L — AB (ref 101–111)
CO2: 20 mmol/L — AB (ref 22–32)
Creatinine, Ser: 0.55 mg/dL — ABNORMAL LOW (ref 0.61–1.24)
GFR calc non Af Amer: >60 mL/min (ref >60)
Glucose, Bld: 94 mg/dL (ref 65–99)
POTASSIUM: 3.6 mmol/L (ref 3.5–5.1)
Sodium: 141 mmol/L (ref 135–145)

## 2015-08-24 LAB — PHOSPHORUS: PHOSPHORUS: 2.8 mg/dL (ref 2.5–4.6)

## 2015-08-24 MED ORDER — POTASSIUM CHLORIDE CRYS ER 20 MEQ PO TBCR
40.0000 meq | EXTENDED_RELEASE_TABLET | Freq: Once | ORAL | Status: AC
  Administered 2015-08-24: 40 meq via ORAL
  Filled 2015-08-24: qty 2

## 2015-08-24 MED ORDER — MENTHOL 3 MG MT LOZG
1.0000 | LOZENGE | OROMUCOSAL | Status: DC | PRN
  Filled 2015-08-24 (×2): qty 9

## 2015-08-24 MED ORDER — MAGNESIUM SULFATE 4 GM/100ML IV SOLN
4.0000 g | Freq: Once | INTRAVENOUS | Status: DC
  Filled 2015-08-24: qty 100

## 2015-08-24 MED ORDER — CALCIUM GLUCONATE 10 % IV SOLN
2.0000 g | Freq: Once | INTRAVENOUS | Status: DC
  Filled 2015-08-24: qty 20

## 2015-08-24 NOTE — Progress Notes (Signed)
Pharmacy Consult for electrolyte replacement Indication: electrolyte abnormalities due to diarrhea  No Known Allergies  Patient Measurements: Height: 5\' 8"  (172.7 cm) Weight: 217 lb 14.4 oz (98.839 kg) IBW/kg (Calculated) : 68.4   Vital Signs: Temp: 97.6 F (36.4 C) (09/09 0816) Temp Source: Oral (09/09 0816) BP: 110/68 mmHg (09/09 1055) Pulse Rate: 94 (09/09 1055)  Labs:  Recent Labs  08/22/15 0428  WBC 5.7  HGB 8.8*  HCT 25.9*  PLT 51*     Recent Labs  08/22/15 0428 08/22/15 2001 08/23/15 0445 08/24/15 0500  NA 138  --  142 141  K 3.1* 3.2* 3.3* 3.6  CL 111  --  115* 116*  CO2 21*  --  21* 20*  GLUCOSE 115*  --  91 94  BUN 5*  --  <5* 7  CREATININE 0.57*  --  0.46* 0.55*  CALCIUM 7.2*  --  7.2* 7.5*  MG 2.2  --  1.3* 1.3*  PHOS 1.7* 3.0 2.9 2.8   Estimated Creatinine Clearance: 132.9 mL/min (by C-G formula based on Cr of 0.55).   No results for input(s): GLUCAP in the last 72 hours.  Medical History: Past Medical History  Diagnosis Date  . Liver damage   . Hypertension   . Neuropathy   . Seizures     complicated ETOH withdrawal  . Pancytopenia, acquired   . Ascites due to alcoholic cirrhosis   . DTs (delirium tremens)   . Hypothyroidism   . Hepatitis C   . C. difficile diarrhea    Assessment: Patient with recurrent C. Diff and electrolyte abnormalities due to severe diarrhea. Pharmacy consulted to manage electrolytes.   Plan:  9/9 Mg 1.3 and Ca 7.5. Magnesium sulfate 4 grams and calcium gluconate 2 grams x1 ordered.  KCl WNL, however continued severe diarrhea, will give KCL 65mEq PO x 1 today.  Rexene Edison, PharmD Clinical Pharmacist  08/24/2015 2:44 PM

## 2015-08-24 NOTE — Progress Notes (Signed)
Pharmacy Consult for electrolyte replacement Indication: electrolyte abnormalities due to diarrhea  No Known Allergies  Patient Measurements: Height: 5\' 8"  (172.7 cm) Weight: 217 lb 14.4 oz (98.839 kg) IBW/kg (Calculated) : 68.4   Vital Signs: Temp: 98.1 F (36.7 C) (09/08 2356) Temp Source: Oral (09/08 2356) BP: 110/62 mmHg (09/08 2356) Pulse Rate: 99 (09/08 2356) Intake/Output from previous day: 09/08 0701 - 09/09 0700 In: 123 [P.O.:120; I.V.:3] Out: 250 [Urine:250] Intake/Output from this shift: Total I/O In: 3 [I.V.:3] Out: 250 [Urine:250]  Labs:  Recent Labs  08/22/15 0428  WBC 5.7  HGB 8.8*  HCT 25.9*  PLT 51*     Recent Labs  08/22/15 0428 08/22/15 2001 08/23/15 0445 08/24/15 0500  NA 138  --  142 141  K 3.1* 3.2* 3.3* 3.6  CL 111  --  115* 116*  CO2 21*  --  21* 20*  GLUCOSE 115*  --  91 94  BUN 5*  --  <5* 7  CREATININE 0.57*  --  0.46* 0.55*  CALCIUM 7.2*  --  7.2* 7.5*  MG 2.2  --  1.3* 1.3*  PHOS 1.7* 3.0 2.9 2.8   Estimated Creatinine Clearance: 132.9 mL/min (by C-G formula based on Cr of 0.55).   No results for input(s): GLUCAP in the last 72 hours.  Medical History: Past Medical History  Diagnosis Date  . Liver damage   . Hypertension   . Neuropathy   . Seizures     complicated ETOH withdrawal  . Pancytopenia, acquired   . Ascites due to alcoholic cirrhosis   . DTs (delirium tremens)   . Hypothyroidism   . Hepatitis C   . C. difficile diarrhea    Assessment: Patient with recurrent C. Diff and electrolyte abnormalities due to severe diarrhea. Pharmacy consulted to manage electrolytes.   Plan:  Potassium and phosphorus low, patient received 40 mEq IV KCl this morning. Will give KPhos 51mmol IV x 1and recheck potassium and phos this evening. BMP tomorrow AM.   9/07:  Potassium @ 20:00 = 3.2  Will order KCl 40 mEq PO X 2 doses and recheck K with AM labs on 9/08.   9/8 Mg 1.3, K+ 3.3 and Ca 7.2. KCl 40 mEq PO x 2 doses,  magnesium 4 grams and calcium gluconate 1 gram ordered. F/u labs in AM.  9/9 Mg 1.3 and Ca 7.5. Magnesium sulfate 4 grams and calcium gluconate 2 grams x1 ordered. F/u labs in AM.  Sim Boast, PharmD, BCPS  08/24/2015

## 2015-08-24 NOTE — Progress Notes (Signed)
[place] at [place] NAME: Troy [name]    MR#:  [id number]  DATE OF BIRTH:  June 29, 1971  SUBJECTIVE: seen  today. Patient complains of worsening of his diarrhea. No abdominal pain. No fever. Tolerating the diet .main concern is his diarrhea. Had about 10 episodes of watery diarrhea since midnight.   CHIEF COMPLAINT:   Chief Complaint  Patient presents with  . Diarrhea    REVIEW OF SYSTEMS:   ROS CONSTITUTIONAL: No fever, fatigue or weakness.  EYES: No blurred or double vision.  EARS, NOSE, AND THROAT: No tinnitus or ear pain.  RESPIRATORY: No cough, shortness of breath, wheezing or hemoptysis.  CARDIOVASCULAR: No chest pain, orthopnea, edema.  GASTROINTESTINAL: No nausea, vomiting, diarrhea or abdominal pain.  GENITOURINARY: No dysuria, hematuria.  ENDOCRINE: No polyuria, nocturia,  HEMATOLOGY: No anemia, easy bruising or bleeding SKIN: No rash or lesion. MUSCULOSKELETAL: No joint pain or arthritis.   NEUROLOGIC: No tingling, numbness, weakness.  PSYCHIATRY: No anxiety or depression.   DRUG ALLERGIES:  No Known Allergies  VITALS:  Blood pressure 110/68, pulse 94, temperature 97.6 F (36.4 C), temperature source Oral, resp. rate 16, height 5\' 8"  (1.727 m), weight 98.839 kg (217 lb 14.4 oz), SpO2 100 %.  PHYSICAL EXAMINATION:  GENERAL:  44 y.o.-year-old patient lying in the bed with no acute distress.  EYES: Pupils equal, round, reactive to light and accommodation. No scleral icterus. Extraocular muscles intact.  HEENT: Head atraumatic, normocephalic. Oropharynx and nasopharynx clear.  NECK:  Supple, no jugular venous distention. No thyroid enlargement, no tenderness.  LUNGS: Normal breath sounds bilaterally, no wheezing, rales,rhonchi or crepitation. No use of accessory muscles of respiration.  CARDIOVASCULAR: S1, S2 normal. No murmurs, rubs, or gallops.  ABDOMEN: Soft, nontender, nondistended. Bowel sounds present. No  organomegaly or mass.  EXTREMITIES: No pedal edema, cyanosis, or clubbing.  NEUROLOGIC: Cranial nerves II through XII are intact. Muscle strength 5/5 in all extremities. Sensation intact. Gait not checked.  PSYCHIATRIC: The patient is alert and oriented x 3.  SKIN: No obvious rash, lesion, or ulcer.    LABORATORY PANEL:   CBC  Recent Labs Lab 08/22/15 0428  WBC 5.7  HGB 8.8*  HCT 25.9*  PLT 51*   ------------------------------------------------------------------------------------------------------------------  Chemistries   Recent Labs Lab 08/20/15 1945  08/24/15 0500  NA 124*  < > 141  K 2.5*  < > 3.6  CL 86*  < > 116*  CO2 22  < > 20*  GLUCOSE 172*  < > 94  BUN 15  < > 7  CREATININE 0.87  < > 0.55*  CALCIUM 7.2*  < > 7.5*  MG 0.7*  < > 1.3*  AST 49*  --   --   ALT 17  --   --   ALKPHOS 178*  --   --   BILITOT 2.5*  --   --   < > = values in this interval not displayed. ------------------------------------------------------------------------------------------------------------------  Cardiac Enzymes  Recent Labs Lab 08/21/15 0053  TROPONINI 0.03   ------------------------------------------------------------------------------------------------------------------  RADIOLOGY:  No results found.  EKG:   Orders placed or performed during the hospital encounter of 08/20/15  . EKG 12-Lead  . EKG 12-Lead  . EKG 12-Lead  . EKG 12-Lead    ASSESSMENT AND PLAN:  1.Recurrent  cdiff colitis;on  PO vanco, IV Flagyl. Seen by GI,  May need the stool transplant due to recurrence. Which can be done as an outpatient. Appreciate GI following.  Continue probiotic./pt was on dificid before.. Patient stool transplant as an outpatient after 10 -14days of treatment with vancomycin.  Sepsis present on admission secondary to C. difficile colitis   2.multiple electrolyte abnormalities; due to diarrhea;hypokalemia/hyponatrmia/;; continue to replace electrolytes.  3.alcoholic  cirrhosis 4.hypothyroidism; 5.GI and DVT pro[phylaxis 6. . Escherichia coli UTI; started on Rocephin. Sensitive to that.  All the records are reviewed and case discussed with Care Management/Social Workerr. Management plans discussed with the patient, family and they are in agreement.  CODE STATUS:full  TOTAL TIME TAKING CARE OF THIS PATIENT: 35  minutes.   POSSIBLE D/C IN 3-4 DAYS, DEPENDING ON CLINICAL CONDITION.   Epifanio Lesches M.D on 08/24/2015 at 11:01 AM  Between 7am to 6pm - Pager - [phone]  After 6pm go to [email or web address] - password [email or web address] [facility]  [place] [facility]  Office  [phone]  CC: Primary care physician; No PCP Per Patient

## 2015-08-24 NOTE — Progress Notes (Signed)
Initial Nutrition Assessment      INTERVENTION:  Meals and snacks: Cater to pt preferences   NUTRITION DIAGNOSIS:   Increased nutrient needs related to acute illness as evidenced by estimated needs.    GOAL:   Patient will meet greater than or equal to 90% of their needs    MONITOR:    (Energy intake, Digestive system, Electrolyte and renal profile)  REASON FOR ASSESSMENT:   LOS    ASSESSMENT:      Pt admitted with c-diff colitis, UTI sepsis Past Medical History  Diagnosis Date  . Liver damage   . Hypertension   . Neuropathy   . Seizures     complicated ETOH withdrawal  . Pancytopenia, acquired   . Ascites due to alcoholic cirrhosis   . DTs (delirium tremens)   . Hypothyroidism   . Hepatitis C   . C. difficile diarrhea     Current Nutrition: Noted ate 100% of lunch tray.  Per I and o sheet intake 90-100 % of meals.  Pt sleeping during visit, tried to wake pt but unable too  Food/Nutrition-Related History: unsure intake prior to admission   Medications: folic acid, calcium gluconate, Mg ox  Electrolyte/Renal Profile and Glucose Profile:   Recent Labs Lab 08/22/15 0428 08/22/15 2001 08/23/15 0445 08/24/15 0500  NA 138  --  142 141  K 3.1* 3.2* 3.3* 3.6  CL 111  --  115* 116*  CO2 21*  --  21* 20*  BUN 5*  --  <5* 7  CREATININE 0.57*  --  0.46* 0.55*  CALCIUM 7.2*  --  7.2* 7.5*  MG 2.2  --  1.3* 1.3*  PHOS 1.7* 3.0 2.9 2.8  GLUCOSE 115*  --  91 94   Protein Profile:   Recent Labs Lab 08/20/15 1945  ALBUMIN 3.1*    Gastrointestinal Profile: Last BM:9/08   Nutrition-Focused Physical Exam Findings:  Unable to complete Nutrition-Focused physical exam at this time.     Weight Change: Per wt encounters 6% weight loss in the last 4 months    Diet Order:  Diet 2 gram sodium Room service appropriate?: Yes; Fluid consistency:: Thin  Skin:   noted pressure ulcer stage I   Height:   Ht Readings from Last 1 Encounters:   08/21/15 5\' 8"  (1.727 m)    Weight:   Wt Readings from Last 1 Encounters:  08/21/15 217 lb 14.4 oz (98.839 kg)     BMI:  Body mass index is 33.14 kg/(m^2).  Estimated Nutritional Needs:   Kcal:  BEE 1559 kcals (IF 1.1-1.3, AC1.6)6063-0160 kcals/d Using IBW of 70kg  Protein:  (1.2-1.5 g/kg) 84-105 g/d  Fluid:  (25-46ml/kg) 1750-2164ml/d  EDUCATION NEEDS:   No education needs identified at this time  [place]. Zenia Resides, [place], [place] (pager)

## 2015-08-25 LAB — CULTURE, BLOOD (ROUTINE X 2): Culture: NO GROWTH

## 2015-08-25 NOTE — Progress Notes (Signed)
[place] at [place] NAME: Troy [name]    MR#:  [id number]  DATE OF BIRTH:  10-01-1971  SUBJECTIVE: seen  today. still has diarrhea. About 6 times since midnight. No abdominal pain. No nausea, no vomiting.   CHIEF COMPLAINT:   Chief Complaint  Patient presents with  . Diarrhea    REVIEW OF SYSTEMS:   Review of Systems  Gastrointestinal: Positive for diarrhea.   CONSTITUTIONAL: No fever, fatigue or weakness.  EYES: No blurred or double vision.  EARS, NOSE, AND THROAT: No tinnitus or ear pain.  RESPIRATORY: No cough, shortness of breath, wheezing or hemoptysis.  CARDIOVASCULAR: No chest pain, orthopnea, edema.  GASTROINTESTINAL: Has diarrhea. He says it is not decreased. GENITOURINARY: No dysuria, hematuria.  ENDOCRINE: No polyuria, nocturia,  HEMATOLOGY: No anemia, easy bruising or bleeding SKIN: No rash or lesion. MUSCULOSKELETAL: No joint pain or arthritis.   NEUROLOGIC: No tingling, numbness, weakness.  PSYCHIATRY: No anxiety or depression.   DRUG ALLERGIES:  No Known Allergies  VITALS:  Blood pressure 110/68, pulse 92, temperature 98.1 F (36.7 C), temperature source Oral, resp. rate 18, height 5\' 8"  (1.727 m), weight 98.839 kg (217 lb 14.4 oz), SpO2 100 %.  PHYSICAL EXAMINATION:  GENERAL:  44 y.o.-year-old patient lying in the bed with no acute distress.  EYES: Pupils equal, round, reactive to light and accommodation. No scleral icterus. Extraocular muscles intact.  HEENT: Head atraumatic, normocephalic. Oropharynx and nasopharynx clear.  NECK:  Supple, no jugular venous distention. No thyroid enlargement, no tenderness.  LUNGS: Normal breath sounds bilaterally, no wheezing, rales,rhonchi or crepitation. No use of accessory muscles of respiration.  CARDIOVASCULAR: S1, S2 normal. No murmurs, rubs, or gallops.  ABDOMEN: Soft, nontender, nondistended. Bowel sounds present. No organomegaly or mass.  EXTREMITIES: No pedal  edema, cyanosis, or clubbing.  NEUROLOGIC: Cranial nerves II through XII are intact. Muscle strength 5/5 in all extremities. Sensation intact. Gait not checked.  PSYCHIATRIC: The patient is alert and oriented x 3.  SKIN: No obvious rash, lesion, or ulcer.    LABORATORY PANEL:   CBC  Recent Labs Lab 08/22/15 0428  WBC 5.7  HGB 8.8*  HCT 25.9*  PLT 51*   ------------------------------------------------------------------------------------------------------------------  Chemistries   Recent Labs Lab 08/20/15 1945  08/24/15 0500  NA 124*  < > 141  K 2.5*  < > 3.6  CL 86*  < > 116*  CO2 22  < > 20*  GLUCOSE 172*  < > 94  BUN 15  < > 7  CREATININE 0.87  < > 0.55*  CALCIUM 7.2*  < > 7.5*  MG 0.7*  < > 1.3*  AST 49*  --   --   ALT 17  --   --   ALKPHOS 178*  --   --   BILITOT 2.5*  --   --   < > = values in this interval not displayed. ------------------------------------------------------------------------------------------------------------------  Cardiac Enzymes  Recent Labs Lab 08/21/15 0053  TROPONINI 0.03   ------------------------------------------------------------------------------------------------------------------  RADIOLOGY:  No results found.  EKG:   Orders placed or performed during the hospital encounter of 08/20/15  . EKG 12-Lead  . EKG 12-Lead  . EKG 12-Lead  . EKG 12-Lead    ASSESSMENT AND PLAN:  1.Recurrent  cdiff colitis;on  PO vanco, IV Flagyl. Seen by GI,  May need the stool transplant due to recurrence. Which can be done as an outpatient. Appreciate GI following. Continue probiotic./pt was on dificid  before.. Patient stool transplant as an outpatient after 10 -14days of treatment with vancomycin.  #2 Sepsis present on admission secondary to C. difficile colitis  3;Septic and hypovolemic shock: Improving. 4.diarrhea;hypokalemia/hyponatrmia/hypomagnesemia;  ;; continue to replace electrolytes.  5..alcoholic  cirrhosis 6.hypothyroidism; 7.GI and DVT pro[phylaxis 8.. . Escherichia coli UTI; started on Rocephin. Sensitive to that.  All the records are reviewed and case discussed with Care Management/Social Workerr. Management plans discussed with the patient, family and they are in agreement.  CODE STATUS:full  TOTAL TIME TAKING CARE OF THIS PATIENT: 35  minutes.   POSSIBLE D/C IN 3-4 DAYS, DEPENDING ON CLINICAL CONDITION.   Epifanio Lesches M.D on 08/25/2015 at 9:21 AM  Between 7am to 6pm - Pager - [phone]  After 6pm go to [email or web address] - password [email or web address] [facility]  [place] [facility]  Office  [phone]  CC: Primary care physician; No PCP Per Patient

## 2015-08-25 NOTE — Progress Notes (Signed)
GI Inpatient Follow-up Note  Patient Identification: Troy [name] is a 44 y.o. male with c.diff diarrhea, also with likely U.C.   Subjective:  8 stool in 24 hours which he says is improved.  No f/c, no n/v, no abd pain.    Scheduled Inpatient Medications:  . calcium gluconate  2 g Intravenous Once  . cefTRIAXone (ROCEPHIN)  IV  1 g Intravenous Q24H  . folic acid  1 mg Oral Daily  . levothyroxine  25 mcg Oral QAC breakfast  . magnesium sulfate 1 - 4 g bolus IVPB  4 g Intravenous Once  . metronidazole  500 mg Intravenous Q8H  . multivitamin with minerals  1 tablet Oral Daily  . nadolol  40 mg Oral Daily  . pregabalin  150 mg Oral BID  . saccharomyces boulardii  250 mg Oral BID  . sodium chloride  3 mL Intravenous Q12H  . thiamine  100 mg Oral Daily   Or  . thiamine  100 mg Intravenous Daily  . vancomycin  125 mg Oral 4 times per day    Continuous Inpatient Infusions:     PRN Inpatient Medications:  acetaminophen **OR** acetaminophen, liver oil-zinc oxide, magnesium oxide, menthol-cetylpyridinium, ondansetron **OR** ondansetron (ZOFRAN) IV, QUEtiapine   Physical Examination: BP 110/68 mmHg  Pulse 92  Temp(Src) 98.1 F (36.7 C) (Oral)  Resp 18  Ht 5\' 8"  (1.727 m)  Wt 98.839 kg (217 lb 14.4 oz)  BMI 33.14 kg/m2  SpO2 100% Gen: NAD, alert and oriented x 4  Chest: CTA bilaterally, no wheezes, crackles, or other adventitious sounds CV: RRR, no m/g/c/r Abd: soft, NT, ND, +BS in all four quadrants; no HSM, guarding, ridigity, or rebound tenderness,+ obese abd.  Ext: mild edema bilat, well perfused with 2+ pulses, Skin: no rash or lesions noted Lymph: no LAD  Data: Lab Results  Component Value Date   WBC 5.7 08/22/2015   HGB 8.8* 08/22/2015   HCT 25.9* 08/22/2015   MCV 95.3 08/22/2015   PLT 51* 08/22/2015    Recent Labs Lab 08/20/15 1945 08/21/15 0053 08/22/15 0428  HGB 11.5* 9.4* 8.8*   Lab Results  Component Value Date   NA 141 08/24/2015   K 3.6  08/24/2015   CL 116* 08/24/2015   CO2 20* 08/24/2015   BUN 7 08/24/2015   CREATININE 0.55* 08/24/2015   Lab Results  Component Value Date   ALT 17 08/20/2015   AST 49* 08/20/2015   ALKPHOS 178* 08/20/2015   BILITOT 2.5* 08/20/2015   No results for input(s): APTT, INR, PTT in the last 168 hours.   Assessment/Plan: Mr. Troy [name] is a 44 y.o. male recurrent c.diff.  Slightly imrpoved on PO vanc  Recommendations: - cont PO Vanc 125 q6 for now. - increase to 250 q6 in no improvement in next 48 hours - likely stool transplant as outpt with Dr Vira Agar - start probiotic once off PO vanc.  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 - needs surveillance colon in about 6 months for hx advanced TA.   Please call with questions or concerns.  Kaesha Kirsch, Grace Blight, MD

## 2015-08-26 LAB — BASIC METABOLIC PANEL
ANION GAP: 3 — AB (ref 5–15)
BUN: 7 mg/dL (ref 6–20)
CHLORIDE: 115 mmol/L — AB (ref 101–111)
CO2: 23 mmol/L (ref 22–32)
Calcium: 7.6 mg/dL — ABNORMAL LOW (ref 8.9–10.3)
Creatinine, Ser: 0.56 mg/dL — ABNORMAL LOW (ref 0.61–1.24)
GFR calc non Af Amer: >60 mL/min (ref >60)
Glucose, Bld: 97 mg/dL (ref 65–99)
POTASSIUM: 3.3 mmol/L — AB (ref 3.5–5.1)
SODIUM: 141 mmol/L (ref 135–145)

## 2015-08-26 LAB — MAGNESIUM: MAGNESIUM: 1.2 mg/dL — AB (ref 1.7–2.4)

## 2015-08-26 LAB — PHOSPHORUS: PHOSPHORUS: 3.5 mg/dL (ref 2.5–4.6)

## 2015-08-26 MED ORDER — VANCOMYCIN 50 MG/ML ORAL SOLUTION
250.0000 mg | Freq: Four times a day (QID) | ORAL | Status: DC
  Administered 2015-08-26 – 2015-08-29 (×12): 250 mg via ORAL
  Filled 2015-08-26 (×16): qty 5

## 2015-08-26 MED ORDER — MESALAMINE 400 MG PO CPDR
400.0000 mg | DELAYED_RELEASE_CAPSULE | Freq: Two times a day (BID) | ORAL | Status: DC
  Administered 2015-08-26 – 2015-08-29 (×6): 400 mg via ORAL
  Filled 2015-08-26 (×8): qty 1

## 2015-08-26 MED ORDER — SODIUM CHLORIDE 0.9 % IV SOLN
2.0000 g | Freq: Once | INTRAVENOUS | Status: AC
  Administered 2015-08-27: 2 g via INTRAVENOUS
  Filled 2015-08-26: qty 20

## 2015-08-26 MED ORDER — POTASSIUM CHLORIDE CRYS ER 20 MEQ PO TBCR
20.0000 meq | EXTENDED_RELEASE_TABLET | Freq: Two times a day (BID) | ORAL | Status: AC
  Administered 2015-08-26 (×2): 20 meq via ORAL
  Filled 2015-08-26 (×2): qty 1

## 2015-08-26 MED ORDER — MAGNESIUM SULFATE 4 GM/100ML IV SOLN
4.0000 g | Freq: Once | INTRAVENOUS | Status: DC
  Filled 2015-08-26: qty 100

## 2015-08-26 NOTE — Progress Notes (Signed)
[place] at [place] NAME: Eschol Auxier    MR#:  [id number]  DATE OF BIRTH:  1971/03/12  Patient admitted for recurrent C. difficile. Still has watery diarrhea, patient had about 6 episodes of diarrhea since midnight. No abdominal pain no nausea no vomiting ,tolerating the diet.    CHIEF COMPLAINT:   Chief Complaint  Patient presents with  . Diarrhea    REVIEW OF SYSTEMS:   Review of Systems  Gastrointestinal: Positive for diarrhea.   CONSTITUTIONAL: No fever, fatigue or weakness.  EYES: No blurred or double vision.  EARS, NOSE, AND THROAT: No tinnitus or ear pain.  RESPIRATORY: No cough, shortness of breath, wheezing or hemoptysis.  CARDIOVASCULAR: No chest pain, orthopnea, edema.  GASTROINTESTINAL: Has diarrhea. He says it is not decreased. GENITOURINARY: No dysuria, hematuria.  ENDOCRINE: No polyuria, nocturia,  HEMATOLOGY: No anemia, easy bruising or bleeding SKIN: No rash or lesion. MUSCULOSKELETAL: No joint pain or arthritis.   NEUROLOGIC: No tingling, numbness, weakness.  PSYCHIATRY: No anxiety or depression.   DRUG ALLERGIES:  No Known Allergies  VITALS:  Blood pressure 109/68, pulse 75, temperature 98.5 F (36.9 C), temperature source Oral, resp. rate 18, height 5\' 8"  (1.727 m), weight 98.839 kg (217 lb 14.4 oz), SpO2 100 %.  PHYSICAL EXAMINATION:  GENERAL:  44 y.o.-year-old patient lying in the bed with no acute distress.  EYES: Pupils equal, round, reactive to light and accommodation. No scleral icterus. Extraocular muscles intact.  HEENT: Head atraumatic, normocephalic. Oropharynx and nasopharynx clear.  NECK:  Supple, no jugular venous distention. No thyroid enlargement, no tenderness.  LUNGS: Normal breath sounds bilaterally, no wheezing, rales,rhonchi or crepitation. No use of accessory muscles of respiration.  CARDIOVASCULAR: S1, S2 normal. No murmurs, rubs, or gallops.  ABDOMEN: Soft, nontender,  nondistended. Bowel sounds present. No organomegaly or mass.  EXTREMITIES: No pedal edema, cyanosis, or clubbing.  NEUROLOGIC: Cranial nerves II through XII are intact. Muscle strength 5/5 in all extremities. Sensation intact. Gait not checked.  PSYCHIATRIC: The patient is alert and oriented x 3.  SKIN: No obvious rash, lesion, or ulcer.    LABORATORY PANEL:   CBC  Recent Labs Lab 08/22/15 0428  WBC 5.7  HGB 8.8*  HCT 25.9*  PLT 51*   ------------------------------------------------------------------------------------------------------------------  Chemistries   Recent Labs Lab 08/20/15 1945  08/26/15 0440  NA 124*  < > 141  K 2.5*  < > 3.3*  CL 86*  < > 115*  CO2 22  < > 23  GLUCOSE 172*  < > 97  BUN 15  < > 7  CREATININE 0.87  < > 0.56*  CALCIUM 7.2*  < > 7.6*  MG 0.7*  < > 1.2*  AST 49*  --   --   ALT 17  --   --   ALKPHOS 178*  --   --   BILITOT 2.5*  --   --   < > = values in this interval not displayed. ------------------------------------------------------------------------------------------------------------------  Cardiac Enzymes  Recent Labs Lab 08/21/15 0053  TROPONINI 0.03   ------------------------------------------------------------------------------------------------------------------  RADIOLOGY:  No results found.  EKG:   Orders placed or performed during the hospital encounter of 08/20/15  . EKG 12-Lead  . EKG 12-Lead  . EKG 12-Lead  . EKG 12-Lead    ASSESSMENT AND PLAN:  1.Recurrent  cdiff colitis;on  PO vanco, IV Flagyl. Seen by GI, because of persistent diarrhea vancomycin dose is increased to 250 mg every every  6 hours. Appreciate GI following. May need the stool transplant due to recurrence. Which can be done as an outpatient. . Continue probiotic./pt was on dificid before.. Patient stool transplant as an outpatient after 10 -14days of treatment with vancomycin.  Patient had a colonoscopy on July 21 by path  shows  early  idiopathic inflammatory bowel disease Gastroenterology recommends  Mesalamine . I have started that.  #2 Sepsis present on admission secondary to C. difficile colitis . 3;Septic and hypovolemic shock: Improving. 4.diarrhea;hypokalemia/hyponatrmia/hypomagnesemia;  ;; continue to replace electrolytes.  C is consulted for this. 5..alcoholic cirrhosis 6.hypothyroidism; 7.GI and DVT pro[phylaxis 8.. . Escherichia coli UTI; started on Rocephin. Sensitive to that.  All the records are reviewed and case discussed with Care Management/Social Workerr. Management plans discussed with the patient, family and they are in agreement.  CODE STATUS:full  TOTAL TIME TAKING CARE OF THIS PATIENT: 35  minutes.   POSSIBLE D/C IN 3-4 DAYS, DEPENDING ON CLINICAL CONDITION.   Epifanio Lesches M.D on 08/26/2015 at 9:47 AM  Between 7am to 6pm - Pager - [phone]  After 6pm go to [email or web address] - password [email or web address] [facility]  [place] [facility]  Office  [phone]  CC: Primary care physician; No PCP Per Patient

## 2015-08-26 NOTE — Progress Notes (Signed)
Pharmacy Consult for electrolyte replacement Indication: electrolyte abnormalities due to diarrhea  No Known Allergies  Patient Measurements: Height: 5\' 8"  (172.7 cm) Weight: 217 lb 14.4 oz (98.839 kg) IBW/kg (Calculated) : 68.4   Vital Signs: Temp: 98.5 F (36.9 C) (09/11 0452) Temp Source: Oral (09/11 0452) BP: 109/68 mmHg (09/11 0452) Pulse Rate: 75 (09/11 0452)  Labs: No results for input(s): WBC, HGB, HCT, PLT, APTT, INR in the last 72 hours.   Recent Labs  08/24/15 0500 08/26/15 0440  NA 141 141  K 3.6 3.3*  CL 116* 115*  CO2 20* 23  GLUCOSE 94 97  BUN 7 7  CREATININE 0.55* 0.56*  CALCIUM 7.5* 7.6*  MG 1.3* 1.2*  PHOS 2.8 3.5   Estimated Creatinine Clearance: 132.9 mL/min (by C-G formula based on Cr of 0.56).   No results for input(s): GLUCAP in the last 72 hours.  Medical History: Past Medical History  Diagnosis Date  . Liver damage   . Hypertension   . Neuropathy   . Seizures     complicated ETOH withdrawal  . Pancytopenia, acquired   . Ascites due to alcoholic cirrhosis   . DTs (delirium tremens)   . Hypothyroidism   . Hepatitis C   . C. difficile diarrhea    Assessment: Patient with recurrent C. Diff and electrolyte abnormalities due to severe diarrhea. Pharmacy consulted to manage electrolytes.   Plan:  9/9 Mg 1.3 and Ca 7.5. Magnesium sulfate 4 grams and calcium gluconate 2 grams x1 ordered.  KCl WNL, however continued severe diarrhea, will give KCL 69mEq PO x 1 today.  9/11 K+ 3.3, Mg 1.2 and Ca 7.6. KCl 40 mEq, magnesium sulfate 4 grams, and calcium gluconate 2 grams x1 ordered. Recheck in AM.  Sim Boast, PharmD, BCPS  08/26/2015

## 2015-08-27 LAB — PHOSPHORUS: Phosphorus: 3.2 mg/dL (ref 2.5–4.6)

## 2015-08-27 LAB — BASIC METABOLIC PANEL
Anion gap: 8 (ref 5–15)
BUN: 7 mg/dL (ref 6–20)
CALCIUM: 7.9 mg/dL — AB (ref 8.9–10.3)
CO2: 19 mmol/L — ABNORMAL LOW (ref 22–32)
CREATININE: 0.55 mg/dL — AB (ref 0.61–1.24)
Chloride: 113 mmol/L — ABNORMAL HIGH (ref 101–111)
GFR calc Af Amer: >60 mL/min (ref >60)
GLUCOSE: 124 mg/dL — AB (ref 65–99)
POTASSIUM: 3 mmol/L — AB (ref 3.5–5.1)
SODIUM: 140 mmol/L (ref 135–145)

## 2015-08-27 LAB — MAGNESIUM: MAGNESIUM: 1 mg/dL — AB (ref 1.7–2.4)

## 2015-08-27 MED ORDER — SODIUM CHLORIDE 0.9 % IV SOLN
2.0000 g | Freq: Once | INTRAVENOUS | Status: AC
  Administered 2015-08-27: 2 g via INTRAVENOUS
  Filled 2015-08-27: qty 20

## 2015-08-27 MED ORDER — POTASSIUM CHLORIDE CRYS ER 20 MEQ PO TBCR
20.0000 meq | EXTENDED_RELEASE_TABLET | ORAL | Status: AC
  Administered 2015-08-27 (×3): 20 meq via ORAL
  Filled 2015-08-27 (×2): qty 1

## 2015-08-27 MED ORDER — POTASSIUM CHLORIDE CRYS ER 20 MEQ PO TBCR
40.0000 meq | EXTENDED_RELEASE_TABLET | Freq: Once | ORAL | Status: AC
  Administered 2015-08-27: 40 meq via ORAL
  Filled 2015-08-27: qty 2

## 2015-08-27 NOTE — Progress Notes (Signed)
Pharmacy Consult for electrolyte replacement Indication: electrolyte abnormalities due to diarrhea  No Known Allergies  Patient Measurements: Height: 5\' 8"  (172.7 cm) Weight: 217 lb 14.4 oz (98.839 kg) IBW/kg (Calculated) : 68.4   Vital Signs:    Labs: No results for input(s): WBC, HGB, HCT, PLT, APTT, INR in the last 72 hours.   Recent Labs  08/26/15 0440 08/27/15 0437  NA 141 140  K 3.3* 3.0*  CL 115* 113*  CO2 23 19*  GLUCOSE 97 124*  BUN 7 7  CREATININE 0.56* 0.55*  CALCIUM 7.6* 7.9*  MG 1.2* 1.0*  PHOS 3.5 3.2   Estimated Creatinine Clearance: 132.9 mL/min (by C-G formula based on Cr of 0.55).   No results for input(s): GLUCAP in the last 72 hours.  Medical History: Past Medical History  Diagnosis Date  . Liver damage   . Hypertension   . Neuropathy   . Seizures     complicated ETOH withdrawal  . Pancytopenia, acquired   . Ascites due to alcoholic cirrhosis   . DTs (delirium tremens)   . Hypothyroidism   . Hepatitis C   . C. difficile diarrhea    Assessment: Patient with recurrent C. Diff and electrolyte abnormalities due to severe diarrhea. Pharmacy consulted to manage electrolytes.   Plan:  9/9 Mg 1.3 and Ca 7.5. Magnesium sulfate 4 grams and calcium gluconate 2 grams x1 ordered.  KCl WNL, however continued severe diarrhea, will give KCL 39mEq PO x 1 today.  9/11 K+ 3.3, Mg 1.2 and Ca 7.6. KCl 40 mEq, magnesium sulfate 4 grams, and calcium gluconate 2 grams x1 ordered. Recheck in AM.  9/12 K+ 3.0, Ca 7.9, Mg 1.0. KCl 60 mEq and calcium gluconate 2 grams x1 ordered. Magnesium from yesterday has apparently not been given. Called RN to inquire as to reason.  Sim Boast, PharmD, BCPS  08/27/2015

## 2015-08-27 NOTE — Progress Notes (Signed)
[facility] at [place] NAME: Troy [name]    MR#:  [id number]  DATE OF BIRTH:  04/21/1971  Patient admitted for recurrent C. difficile. Still has watery diarrhea, patient had about 6 episodes of diarrhea since midnight. No abdominal pain no nausea no vomiting ,tolerating the diet.    CHIEF COMPLAINT:   Chief Complaint  Patient presents with  . Diarrhea   still having 4-5 loose bowel movements but not watery anymore. Getting more formed.  Potassium of 3.0 REVIEW OF SYSTEMS:   Review of Systems  Gastrointestinal: Positive for diarrhea.   CONSTITUTIONAL: No fever, fatigue or weakness.  EYES: No blurred or double vision.  EARS, NOSE, AND THROAT: No tinnitus or ear pain.  RESPIRATORY: No cough, shortness of breath, wheezing or hemoptysis.  CARDIOVASCULAR: No chest pain, orthopnea, edema.  GASTROINTESTINAL: Has diarrhea. He says it is not decreased. GENITOURINARY: No dysuria, hematuria.  ENDOCRINE: No polyuria, nocturia,  HEMATOLOGY: No anemia, easy bruising or bleeding SKIN: No rash or lesion. MUSCULOSKELETAL: No joint pain or arthritis.   NEUROLOGIC: No tingling, numbness, weakness.  PSYCHIATRY: No anxiety or depression.  DRUG ALLERGIES:  No Known Allergies VITALS:  Blood pressure 103/69, pulse 86, temperature 98.3 F (36.8 C), temperature source Oral, resp. rate 16, height 5\' 8"  (1.727 m), weight 217 lb 14.4 oz (98.839 kg), SpO2 100 %. PHYSICAL EXAMINATION:  GENERAL:  44 y.o.-year-old patient lying in the bed with no acute distress.  EYES: Pupils equal, round, reactive to light and accommodation. No scleral icterus. Extraocular muscles intact.  HEENT: Head atraumatic, normocephalic. Oropharynx and nasopharynx clear.  NECK:  Supple, no jugular venous distention. No thyroid enlargement, no tenderness.  LUNGS: Normal breath sounds bilaterally, no wheezing, rales,rhonchi or crepitation. No use of accessory muscles of respiration.   CARDIOVASCULAR: S1, S2 normal. No murmurs, rubs, or gallops.  ABDOMEN: Soft, nontender, nondistended. Bowel sounds present. No organomegaly or mass.  EXTREMITIES: No pedal edema, cyanosis, or clubbing.  NEUROLOGIC: Cranial nerves II through XII are intact. Muscle strength 5/5 in all extremities. Sensation intact. Gait not checked.  PSYCHIATRIC: The patient is alert and oriented x 3.  SKIN: No obvious rash, lesion, or ulcer.  LABORATORY PANEL:   CBC  Recent Labs Lab 08/22/15 0428  WBC 5.7  HGB 8.8*  HCT 25.9*  PLT 51*   ------------------------------------------------------------------------------------------------------------------  Chemistries   Recent Labs Lab 08/20/15 1945  08/27/15 0437  NA 124*  < > 140  K 2.5*  < > 3.0*  CL 86*  < > 113*  CO2 22  < > 19*  GLUCOSE 172*  < > 124*  BUN 15  < > 7  CREATININE 0.87  < > 0.55*  CALCIUM 7.2*  < > 7.9*  MG 0.7*  < > 1.0*  AST 49*  --   --   ALT 17  --   --   ALKPHOS 178*  --   --   BILITOT 2.5*  --   --   < > = values in this interval not displayed.  ASSESSMENT AND PLAN:  1.Recurrent  cdiff colitis;on  PO vanco, IV Flagyl. Seen by GI, because of persistent diarrhea vancomycin dose is increased to 250 mg every every 6 hours. Appreciate GI following. May need the stool transplant due to recurrence. Which can be done as an outpatient. . Continue probiotic./pt was on dificid before.. Patient stool transplant as an outpatient after 10 -14days of treatment with vancomycin.  Patient had  a colonoscopy on July 21 by path  shows  early idiopathic inflammatory bowel disease Gastroenterology recommends  Mesalamine which has been started  #2 Sepsis present on admission secondary to C. difficile colitis.  Resolved now  3;Septic and hypovolemic shock: Improving.  Resolved  4.diarrhea;hypokalemia/hyponatrmia/hypomagnesemia; slowly improving, aggressively replacing electrolytes.  He is still 3.0  5.Marland Kitchenalcoholic  cirrhosis 6.hypothyroidism; 7.GI and DVT pro[phylaxis 8. Escherichia coli UTI; started on Rocephin. Sensitive to that.  We will stop antibiotics today as this is uncomplicated UTI.  He is not running any more fever, no white count  All the records are reviewed and case discussed with Care Management/Social Worker. Management plans discussed with the patient, family and they are in agreement.  CODE STATUS: Full code  TOTAL TIME TAKING CARE OF THIS PATIENT: 35 minutes.   POSSIBLE D/C IN 1-2 DAYS, DEPENDING ON CLINICAL CONDITION.   [facility], Herby Amick M.D on 08/27/2015 at 1:41 PM  Between 7am to 6pm - Pager - [phone]  After 6pm go to [email or web address] - password [email or web address] [facility]  [place] [facility]  Office  [phone]  CC: Primary care physician; No PCP Per Patient

## 2015-08-27 NOTE — Progress Notes (Signed)
GI Note:  S:   Stool output somewhat decreased today.  6 stool in 24 hours.  Starting to form up.  No abd pain, no f/c, no n/v. Tolerating PO.  Inquiring about d/c.   Exam vss Nad, a and o x 3 Cta, no w/c Rrr, no m/r/g Nabs, obese, no r/g  Lab Results  Component Value Date   WBC 5.7 08/22/2015   HGB 8.8* 08/22/2015   HCT 25.9* 08/22/2015   MCV 95.3 08/22/2015   PLT 51* 08/22/2015    Current facility-administered medications:  .  acetaminophen (TYLENOL) tablet 650 mg, 650 mg, Oral, Q6H PRN **OR** acetaminophen (TYLENOL) suppository 650 mg, 650 mg, Rectal, Q6H PRN, Lance Coon, MD .  calcium gluconate 2 g in sodium chloride 0.9 % 100 mL IVPB, 2 g, Intravenous, Once, Epifanio Lesches, MD .  cefTRIAXone (ROCEPHIN) 1 g in dextrose 5 % 50 mL IVPB, 1 g, Intravenous, Q24H, Epifanio Lesches, MD, 1 g at 08/26/15 1405 .  folic acid (FOLVITE) tablet 1 mg, 1 mg, Oral, Daily, Lance Coon, MD, 1 mg at 08/26/15 1021 .  levothyroxine (SYNTHROID, LEVOTHROID) tablet 25 mcg, 25 mcg, Oral, QAC breakfast, Lance Coon, MD, 25 mcg at 08/26/15 1021 .  liver oil-zinc oxide (DESITIN) 40 % ointment, , Topical, PRN, Harrie Foreman, MD .  magnesium oxide (MAG-OX) tablet 400 mg, 400 mg, Oral, Q4H PRN, Harrie Foreman, MD .  magnesium sulfate IVPB 4 g 100 mL, 4 g, Intravenous, Once, Epifanio Lesches, MD .  menthol-cetylpyridinium (CEPACOL) lozenge 3 mg, 1 lozenge, Oral, PRN, Lytle Butte, MD .  Mesalamine (ASACOL) DR capsule 400 mg, 400 mg, Oral, BID, Epifanio Lesches, MD, 400 mg at 08/26/15 2034 .  metroNIDAZOLE (FLAGYL) IVPB 500 mg, 500 mg, Intravenous, Q8H, Lance Coon, MD, 500 mg at 08/27/15 0015 .  multivitamin with minerals tablet 1 tablet, 1 tablet, Oral, Daily, Lance Coon, MD, 1 tablet at 08/26/15 1021 .  nadolol (CORGARD) tablet 40 mg, 40 mg, Oral, Daily, Lance Coon, MD, 40 mg at 08/26/15 1021 .  ondansetron (ZOFRAN) tablet 4 mg, 4 mg, Oral, Q6H PRN **OR** ondansetron (ZOFRAN)  injection 4 mg, 4 mg, Intravenous, Q6H PRN, Lance Coon, MD .  potassium chloride SA (K-DUR,KLOR-CON) CR tablet 20 mEq, 20 mEq, Oral, 6 times per day, Epifanio Lesches, MD .  pregabalin (LYRICA) capsule 150 mg, 150 mg, Oral, BID, Epifanio Lesches, MD, 150 mg at 08/26/15 2034 .  QUEtiapine (SEROQUEL) tablet 50 mg, 50 mg, Oral, QHS PRN, Lance Coon, MD .  saccharomyces boulardii [facility]) capsule 250 mg, 250 mg, Oral, BID, Epifanio Lesches, MD, 250 mg at 08/26/15 2034 .  sodium chloride 0.9 % injection 3 mL, 3 mL, Intravenous, Q12H, Lance Coon, MD, 3 mL at 08/26/15 2035 .  thiamine (VITAMIN B-1) tablet 100 mg, 100 mg, Oral, Daily, 100 mg at 08/26/15 1022 **OR** thiamine (B-1) injection 100 mg, 100 mg, Intravenous, Daily, Lance Coon, MD .  vancomycin (VANCOCIN) 50 mg/mL oral solution 250 mg, 250 mg, Oral, 4 times per day, Epifanio Lesches, MD, 250 mg at 08/27/15 0528    A/p:  Improving c.diff.  - cont po vanc 250 q6 for 14 days - possible fecal transplant as outpt with Dr Vira Agar.

## 2015-08-28 LAB — BASIC METABOLIC PANEL
ANION GAP: 5 (ref 5–15)
ANION GAP: 6 (ref 5–15)
BUN: 7 mg/dL (ref 6–20)
BUN: 7 mg/dL (ref 6–20)
CALCIUM: 7.9 mg/dL — AB (ref 8.9–10.3)
CALCIUM: 8 mg/dL — AB (ref 8.9–10.3)
CHLORIDE: 115 mmol/L — AB (ref 101–111)
CHLORIDE: 110 mmol/L (ref 101–111)
CO2: 22 mmol/L (ref 22–32)
CO2: 23 mmol/L (ref 22–32)
Creatinine, Ser: 0.46 mg/dL — ABNORMAL LOW (ref 0.61–1.24)
Creatinine, Ser: 0.53 mg/dL — ABNORMAL LOW (ref 0.61–1.24)
GFR calc Af Amer: >60 mL/min (ref >60)
GFR calc Af Amer: >60 mL/min (ref >60)
GFR calc non Af Amer: >60 mL/min (ref >60)
GFR calc non Af Amer: >60 mL/min (ref >60)
GLUCOSE: 91 mg/dL (ref 65–99)
GLUCOSE: 159 mg/dL — AB (ref 65–99)
POTASSIUM: 3.6 mmol/L (ref 3.5–5.1)
Potassium: 3.5 mmol/L (ref 3.5–5.1)
Sodium: 142 mmol/L (ref 135–145)
Sodium: 139 mmol/L (ref 135–145)

## 2015-08-28 LAB — CBC
HEMATOCRIT: 26.4 % — AB (ref 40.0–52.0)
HEMOGLOBIN: 8.7 g/dL — AB (ref 13.0–18.0)
MCH: 32.2 pg (ref 26.0–34.0)
MCHC: 32.8 g/dL (ref 32.0–36.0)
MCV: 98.1 fL (ref 80.0–100.0)
Platelets: 151 10*3/uL (ref 150–440)
RBC: 2.69 MIL/uL — ABNORMAL LOW (ref 4.40–5.90)
RDW: 15.7 % — AB (ref 11.5–14.5)
WBC: 3.7 10*3/uL — AB (ref 3.8–10.6)

## 2015-08-28 LAB — MAGNESIUM
Magnesium: 1.1 mg/dL — ABNORMAL LOW (ref 1.7–2.4)
Magnesium: 1.8 mg/dL (ref 1.7–2.4)

## 2015-08-28 MED ORDER — MAGNESIUM SULFATE 4 GM/100ML IV SOLN
4.0000 g | Freq: Once | INTRAVENOUS | Status: AC
  Administered 2015-08-28: 4 g via INTRAVENOUS
  Filled 2015-08-28: qty 100

## 2015-08-28 MED ORDER — POTASSIUM CHLORIDE CRYS ER 20 MEQ PO TBCR
40.0000 meq | EXTENDED_RELEASE_TABLET | Freq: Once | ORAL | Status: AC
  Administered 2015-08-28: 40 meq via ORAL
  Filled 2015-08-28: qty 2

## 2015-08-28 MED ORDER — POTASSIUM CHLORIDE 10 MEQ/100ML IV SOLN
10.0000 meq | INTRAVENOUS | Status: DC
  Filled 2015-08-28 (×4): qty 100

## 2015-08-28 NOTE — Progress Notes (Signed)
Called Dr. Marcille Blanco regarding an order for potassium iv and patient's potassium is within normal limits - 3.5.  Doctor put in order for discontinuation of potassium.  Christene Slates  08/28/2015  6:57 AM

## 2015-08-28 NOTE — Progress Notes (Signed)
Pt refuses bed alarm. 

## 2015-08-28 NOTE — Consult Note (Signed)
Pt stools are forming up, described as "turds" with mush.  He may go home in 1-2 days.  I need to see him in office this Friday if he does go home to arrange a stool transplant as an out patient.  I usually give 2 weeks of vancomycin for this so please write for another week of this, he has had a week of it so far in hospital.

## 2015-08-28 NOTE — Progress Notes (Signed)
Pharmacy Consult for electrolyte replacement Indication: electrolyte abnormalities due to diarrhea  No Known Allergies  Patient Measurements: Height: 5\' 8"  (172.7 cm) Weight: 217 lb 14.4 oz (98.839 kg) IBW/kg (Calculated) : 68.4   Vital Signs: Temp: 98.1 F (36.7 C) (09/13 1223) Temp Source: Oral (09/13 1223) BP: 111/73 mmHg (09/13 1223) Pulse Rate: 77 (09/13 1223)  Labs:  Recent Labs  08/28/15 0421  WBC 3.7*  HGB 8.7*  HCT 26.4*  PLT 151     Recent Labs  08/26/15 0440 08/27/15 0437 08/28/15 0421 08/28/15 1325  NA 141 140 142 139  K 3.3* 3.0* 3.5 3.6  CL 115* 113* 115* 110  CO2 23 19* 22 23  GLUCOSE 97 124* 91 159*  BUN 7 7 7 7   CREATININE 0.56* 0.55* 0.46* 0.53*  CALCIUM 7.6* 7.9* 7.9* 8.0*  MG 1.2* 1.0* 1.1* 1.8  PHOS 3.5 3.2  --   --    Estimated Creatinine Clearance: 132.9 mL/min (by C-G formula based on Cr of 0.53).   No results for input(s): GLUCAP in the last 72 hours.  Medical History: Past Medical History  Diagnosis Date  . Liver damage   . Hypertension   . Neuropathy   . Seizures     complicated ETOH withdrawal  . Pancytopenia, acquired   . Ascites due to alcoholic cirrhosis   . DTs (delirium tremens)   . Hypothyroidism   . Hepatitis C   . C. difficile diarrhea    Assessment: Patient with recurrent C. Diff and electrolyte abnormalities due to severe diarrhea. Pharmacy consulted to manage electrolytes.   Plan:  9/9 Mg 1.3 and Ca 7.5. Magnesium sulfate 4 grams and calcium gluconate 2 grams x1 ordered.  KCl WNL, however continued severe diarrhea, will give KCL 15mEq PO x 1 today.  9/11 K+ 3.3, Mg 1.2 and Ca 7.6. KCl 40 mEq, magnesium sulfate 4 grams, and calcium gluconate 2 grams x1 ordered. Recheck in AM.  9/12 K+ 3.0, Ca 7.9, Mg 1.0. KCl 60 mEq and calcium gluconate 2 grams x1 ordered. Magnesium from yesterday has apparently not been given. Called RN to inquire as to reason.  0913 K 3.5, adjusted calcium 8.6, Mg 1.1. Magnesium 4  gm IV x 1 and potassium 10 mEq IV Q1H x 4 doses, will recheck labs this afternoon.   9/13 PM: K 3.6, Mag 1.8 - mag 4 g IV x1 given this AM, IV KCl runs d/c'd (no doses given). Will order KCl 40 mEq PO x1 for today. Will recheck labs in AM.     Rayna Sexton, PharmD, BCPS Clinical Pharmacist 08/28/2015 3:01 PM

## 2015-08-28 NOTE — Progress Notes (Signed)
[place] at [place] NAME: Troy [name]    MR#:  [id number]  DATE OF BIRTH:  07-31-1971  Patient admitted for recurrent C. difficile. Still has watery diarrhea, patient had about 6 episodes of diarrhea since midnight. No abdominal pain no nausea no vomiting ,tolerating the diet.    CHIEF COMPLAINT:   Chief Complaint  Patient presents with  . Diarrhea  2-3 loose bowel movements - Getting more formed.  K 3.5 REVIEW OF SYSTEMS:   Review of Systems  Gastrointestinal: Positive for diarrhea.   CONSTITUTIONAL: No fever, fatigue or weakness.  EYES: No blurred or double vision.  EARS, NOSE, AND THROAT: No tinnitus or ear pain.  RESPIRATORY: No cough, shortness of breath, wheezing or hemoptysis.  CARDIOVASCULAR: No chest pain, orthopnea, edema.  GASTROINTESTINAL: Has diarrhea. He says it is not decreased. GENITOURINARY: No dysuria, hematuria.  ENDOCRINE: No polyuria, nocturia,  HEMATOLOGY: No anemia, easy bruising or bleeding SKIN: No rash or lesion. MUSCULOSKELETAL: No joint pain or arthritis.   NEUROLOGIC: No tingling, numbness, weakness.  PSYCHIATRY: No anxiety or depression.  DRUG ALLERGIES:  No Known Allergies VITALS:  Blood pressure 121/62, pulse 81, temperature 98.2 F (36.8 C), temperature source Oral, resp. rate 16, height 5\' 8"  (1.727 m), weight 217 lb 14.4 oz (98.839 kg), SpO2 100 %. PHYSICAL EXAMINATION:  GENERAL:  44 y.o.-year-old patient lying in the bed with no acute distress.  EYES: Pupils equal, round, reactive to light and accommodation. No scleral icterus. Extraocular muscles intact.  HEENT: Head atraumatic, normocephalic. Oropharynx and nasopharynx clear.  NECK:  Supple, no jugular venous distention. No thyroid enlargement, no tenderness.  LUNGS: Normal breath sounds bilaterally, no wheezing, rales,rhonchi or crepitation. No use of accessory muscles of respiration.  CARDIOVASCULAR: S1, S2 normal. No murmurs, rubs, or  gallops.  ABDOMEN: Soft, nontender, nondistended. Bowel sounds present. No organomegaly or mass.  EXTREMITIES: No pedal edema, cyanosis, or clubbing.  NEUROLOGIC: Cranial nerves II through XII are intact. Muscle strength 5/5 in all extremities. Sensation intact. Gait not checked.  PSYCHIATRIC: The patient is alert and oriented x 3.  SKIN: No obvious rash, lesion, or ulcer.  LABORATORY PANEL:   CBC  Recent Labs Lab 08/28/15 0421  WBC 3.7*  HGB 8.7*  HCT 26.4*  PLT 151   ------------------------------------------------------------------------------------------------------------------  Chemistries   Recent Labs Lab 08/28/15 0421  NA 142  K 3.5  CL 115*  CO2 22  GLUCOSE 91  BUN 7  CREATININE 0.46*  CALCIUM 7.9*  MG 1.1*    ASSESSMENT AND PLAN:  1.Recurrent  cdiff colitis;on  PO vanco, IV Flagyl. Seen by GI, because of persistent diarrhea vancomycin dose is increased to 250 mg every every 6 hours. Appreciate GI following. Plan for stool transplant due to recurrence as an outpatient. . Continue probiotic. stool transplant as an outpatient after 10 -14days of treatment with vancomycin.  Patient had a colonoscopy on July 21 by path  shows  early idiopathic inflammatory bowel disease Gastroenterology recommends  Mesalamine which has been started  #2 Sepsis present on admission secondary to C. difficile colitis.  Resolved now  3;Septic and hypovolemic shock: Improving.  Resolved  4.diarrhea;hypokalemia/hyponatrmia/hypomagnesemia; slowly improving, aggressively replacing electrolytes. K 3.5 today 5.Marland Kitchenalcoholic cirrhosis 6.hypothyroidism; 7.GI and DVT pro[phylaxis 8. Escherichia coli UTI: treated  All the records are reviewed and case discussed with Care Management/Social Worker. Management plans discussed with the patient, family and they are in agreement.  CODE STATUS: Full code  TOTAL TIME  TAKING CARE OF THIS PATIENT: 35 minutes.   POSSIBLE D/C IN AM, DEPENDING ON  CLINICAL CONDITION.   [facility], Troy [name] M.D on 08/28/2015 at 11:54 AM  Between 7am to 6pm - Pager - [phone]  After 6pm go to [email or web address] - password [email or web address] [facility]  [place] [facility]  Office  [phone]  CC: Primary care physician; No PCP Per Patient

## 2015-08-28 NOTE — Progress Notes (Signed)
Pharmacy Consult for electrolyte replacement Indication: electrolyte abnormalities due to diarrhea  No Known Allergies  Patient Measurements: Height: 5\' 8"  (172.7 cm) Weight: 217 lb 14.4 oz (98.839 kg) IBW/kg (Calculated) : 68.4   Vital Signs: Temp: 98 F (36.7 C) (09/12 2025) BP: 113/71 mmHg (09/12 2025) Pulse Rate: 80 (09/12 2025)  Labs:  Recent Labs  08/28/15 0421  WBC 3.7*  HGB 8.7*  HCT 26.4*  PLT 151     Recent Labs  08/26/15 0440 08/27/15 0437 08/28/15 0421  NA 141 140 142  K 3.3* 3.0* 3.5  CL 115* 113* 115*  CO2 23 19* 22  GLUCOSE 97 124* 91  BUN 7 7 7   CREATININE 0.56* 0.55* 0.46*  CALCIUM 7.6* 7.9* 7.9*  MG 1.2* 1.0* 1.1*  PHOS 3.5 3.2  --    Estimated Creatinine Clearance: 132.9 mL/min (by C-G formula based on Cr of 0.46).   No results for input(s): GLUCAP in the last 72 hours.  Medical History: Past Medical History  Diagnosis Date  . Liver damage   . Hypertension   . Neuropathy   . Seizures     complicated ETOH withdrawal  . Pancytopenia, acquired   . Ascites due to alcoholic cirrhosis   . DTs (delirium tremens)   . Hypothyroidism   . Hepatitis C   . C. difficile diarrhea    Assessment: Patient with recurrent C. Diff and electrolyte abnormalities due to severe diarrhea. Pharmacy consulted to manage electrolytes.   Plan:  9/9 Mg 1.3 and Ca 7.5. Magnesium sulfate 4 grams and calcium gluconate 2 grams x1 ordered.  KCl WNL, however continued severe diarrhea, will give KCL 27mEq PO x 1 today.  9/11 K+ 3.3, Mg 1.2 and Ca 7.6. KCl 40 mEq, magnesium sulfate 4 grams, and calcium gluconate 2 grams x1 ordered. Recheck in AM.  9/12 K+ 3.0, Ca 7.9, Mg 1.0. KCl 60 mEq and calcium gluconate 2 grams x1 ordered. Magnesium from yesterday has apparently not been given. Called RN to inquire as to reason.  0913 K 3.5, adjusted calcium 8.6, Mg 1.1. Magnesium 4 gm IV x 1 and potassium 10 mEq IV Q1H x 4 doses, will recheck labs this afternoon.    Shrihaan Porzio A. [place], [place].D. Clinical Pharmacist  08/28/2015

## 2015-08-29 LAB — CBC
HEMATOCRIT: 27 % — AB (ref 40.0–52.0)
HEMOGLOBIN: 8.9 g/dL — AB (ref 13.0–18.0)
MCH: 32.4 pg (ref 26.0–34.0)
MCHC: 33 g/dL (ref 32.0–36.0)
MCV: 98.1 fL (ref 80.0–100.0)
Platelets: 158 10*3/uL (ref 150–440)
RBC: 2.75 MIL/uL — AB (ref 4.40–5.90)
RDW: 16.6 % — ABNORMAL HIGH (ref 11.5–14.5)
WBC: 3.8 10*3/uL (ref 3.8–10.6)

## 2015-08-29 LAB — BASIC METABOLIC PANEL
ANION GAP: 5 (ref 5–15)
BUN: 7 mg/dL (ref 6–20)
CALCIUM: 8 mg/dL — AB (ref 8.9–10.3)
CHLORIDE: 115 mmol/L — AB (ref 101–111)
CO2: 22 mmol/L (ref 22–32)
Creatinine, Ser: 0.56 mg/dL — ABNORMAL LOW (ref 0.61–1.24)
GFR calc Af Amer: >60 mL/min (ref >60)
GLUCOSE: 135 mg/dL — AB (ref 65–99)
POTASSIUM: 3.6 mmol/L (ref 3.5–5.1)
Sodium: 142 mmol/L (ref 135–145)

## 2015-08-29 LAB — MAGNESIUM: MAGNESIUM: 1.4 mg/dL — AB (ref 1.7–2.4)

## 2015-08-29 MED ORDER — VANCOMYCIN 50 MG/ML ORAL SOLUTION: 250.0000 mg | Freq: Four times a day (QID) | ORAL | Status: AC

## 2015-08-29 MED ORDER — MAGNESIUM SULFATE 4 GM/100ML IV SOLN: 4.0000 g | INTRAVENOUS | Status: DC

## 2015-08-29 MED ORDER — POTASSIUM CHLORIDE CRYS ER 20 MEQ PO TBCR
20.0000 meq | EXTENDED_RELEASE_TABLET | Freq: Two times a day (BID) | ORAL | Status: DC
  Administered 2015-08-29: 20 meq via ORAL
  Filled 2015-08-29: qty 1

## 2015-08-29 MED ORDER — MAGNESIUM SULFATE 4 GM/100ML IV SOLN
4.0000 g | Freq: Once | INTRAVENOUS | Status: AC
  Administered 2015-08-29: 4 g via INTRAVENOUS
  Filled 2015-08-29: qty 100

## 2015-08-29 NOTE — Progress Notes (Signed)
Pharmacy Consult for electrolyte replacement Indication: electrolyte abnormalities due to diarrhea  No Known Allergies  Patient Measurements: Height: 5\' 8"  (172.7 cm) Weight: 217 lb 14.4 oz (98.839 kg) IBW/kg (Calculated) : 68.4   Vital Signs: Temp: 98.6 F (37 C) (09/14 0459) Temp Source: Oral (09/13 2147) BP: 106/60 mmHg (09/14 0459) Pulse Rate: 67 (09/14 0459)  Labs:  Recent Labs  08/28/15 0421 08/29/15 0612  WBC 3.7* 3.8  HGB 8.7* 8.9*  HCT 26.4* 27.0*  PLT 151 158     Recent Labs  08/27/15 0437 08/28/15 0421 08/28/15 1325 08/29/15 0612  NA 140 142 139 142  K 3.0* 3.5 3.6 3.6  CL 113* 115* 110 115*  CO2 19* 22 23 22   GLUCOSE 124* 91 159* 135*  BUN 7 7 7 7   CREATININE 0.55* 0.46* 0.53* 0.56*  CALCIUM 7.9* 7.9* 8.0* 8.0*  MG 1.0* 1.1* 1.8 1.4*  PHOS 3.2  --   --   --    Estimated Creatinine Clearance: 132.9 mL/min (by C-G formula based on Cr of 0.56).   No results for input(s): GLUCAP in the last 72 hours.  Medical History: Past Medical History  Diagnosis Date  . Liver damage   . Hypertension   . Neuropathy   . Seizures     complicated ETOH withdrawal  . Pancytopenia, acquired   . Ascites due to alcoholic cirrhosis   . DTs (delirium tremens)   . Hypothyroidism   . Hepatitis C   . C. difficile diarrhea    Assessment: Patient with recurrent C. Diff and electrolyte abnormalities due to severe diarrhea. Pharmacy consulted to manage electrolytes.   Plan:  9/9 Mg 1.3 and Ca 7.5. Magnesium sulfate 4 grams and calcium gluconate 2 grams x1 ordered.  KCl WNL, however continued severe diarrhea, will give KCL 17mEq PO x 1 today.  9/11 K+ 3.3, Mg 1.2 and Ca 7.6. KCl 40 mEq, magnesium sulfate 4 grams, and calcium gluconate 2 grams x1 ordered. Recheck in AM.  9/12 K+ 3.0, Ca 7.9, Mg 1.0. KCl 60 mEq and calcium gluconate 2 grams x1 ordered. Magnesium from yesterday has apparently not been given. Called RN to inquire as to reason.  0913 K 3.5, adjusted  calcium 8.6, Mg 1.1. Magnesium 4 gm IV x 1 and potassium 10 mEq IV Q1H x 4 doses, will recheck labs this afternoon.   9/13 PM: K 3.6, Mag 1.8 - mag 4 g IV x1 given this AM, IV KCl runs d/c'd (no doses given). Will order KCl 40 mEq PO x1 for today. Will recheck labs in AM.   0914 AM Mg 1.4 K 3.5 ordered 4 gm IV mag x 1 and potassium chloride oral 20 mEq BID x 2 doses, will follow up AM.     Ovid Curd A. [place], [place].D. Clinical Pharmacist 08/29/2015 7:13 AM

## 2015-08-29 NOTE — Progress Notes (Signed)
A&O. VSS. Tolerating diet well. No complaints of pain or nausea. No stools reported. Discharged per MD orders. Discharge instructions reviewed with pt and pt verbalized understanding. Prescription given to pt. IV removed per policy. Discharged via wheelchair escorted by auxilary.

## 2015-08-29 NOTE — Discharge Instructions (Signed)
Clostridium Difficile Infection °Clostridium difficile (C. difficile) is a germ found in the intestines. C. difficile infection can occur after taking some medicines. C. difficile infection can cause watery poop (diarrhea) or severe disease. °HOME CARE °· Drink enough fluids to keep your pee (urine) clear or pale yellow. Avoid milk, caffeine, and alcohol. °· Ask your doctor how to replace body fluid losses (rehydrate). °· Eat small meals more often rather than large meals. °· Take your medicine (antibiotics) as told. Finish it even if you start to feel better. °· Do not  use medicines to slow the watery poop. °· Wash your hands well after using the bathroom and before preparing food. °· Make sure people who live with you wash their hands often. °· Clean all surfaces. Use a product that contains chlorine bleach. °GET HELP RIGHT AWAY IF:  °· The watery poop does not stop, or it comes back after you finish your medicine. °· You feel very dry or thirsty (dehydrated). °· You have a fever. °· You have more belly (abdominal) pain or tenderness. °· There is blood in your poop (stool), or your poop is black and tar-like. °· You cannot eat food or drink liquids without throwing up (vomiting). °MAKE SURE YOU: °· Understand these instructions. °· Will watch your condition. °· Will get help right away if you are not doing well or get worse. °Document Released: 09/28/2009 Document Revised: 04/17/2014 Document Reviewed: 05/09/2011 °Exit[employer]® Patient Information ©2015 [employer]. This information is not intended to replace advice given to you by your health care provider. Make sure you discuss any questions you have with your health care provider. ° °

## 2015-08-30 NOTE — Discharge Summary (Signed)
[facility] NAME: Troy [name]    MR#:  [id number]  DATE OF BIRTH:  16-Aug-1971  DATE OF ADMISSION:  08/20/2015 ADMITTING PHYSICIAN: Lance Coon, MD  DATE OF DISCHARGE: 08/29/2015  2:06 PM  PRIMARY CARE PHYSICIAN: Sherrin Daisy MD   ADMISSION DIAGNOSIS:  Hypokalemia [E87.6] Severe diarrhea [K52.9] C. difficile diarrhea [A04.7] Sepsis, due to unspecified organism [A41.9] DISCHARGE DIAGNOSIS:  Principal Problem:   Enteritis due to Clostridium difficile Active Problems:   Hypokalemia   Hyponatremia   Hypocalcemia   Alcohol abuse   HTN (hypertension)   Hypothyroidism (acquired)   GERD (gastroesophageal reflux disease)   Pressure ulcer  SECONDARY DIAGNOSIS:   Past Medical History  Diagnosis Date  . Liver damage   . Hypertension   . Neuropathy   . Seizures     complicated ETOH withdrawal  . Pancytopenia, acquired   . Ascites due to alcoholic cirrhosis   . DTs (delirium tremens)   . Hypothyroidism   . Hepatitis C   . C. difficile diarrhea    HOSPITAL COURSE:  44 y.o. male who was admitted for Persistent large volume watery diarrhea. Please see Dr Tobey Grim dictated H & P for further details. 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 GI c/s was obtained with Dr Vira Agar who recommended to treat him with PO vanco & IV Flagyl which he responded well. GI is also planning for stool transplant due to recurrence as an outpatient after 10 -14days of treatment with vancomycin.   He was noted to have Sepsis present on admission secondary to C. difficile colitis which was resolved with treatment.   * Septic and hypovolemic shock: Resolved  * hypokalemia/hyponatrmia/hypomagnesemia: repleted and resolved.  * Escherichia coli UTI: treated  Patient was feeling much better and diarrhea was improved significantly. He was discharged home in stable condition with scheduled outpt f/up on coming Friday with GI. He was agreeable  with d/c plans. DISCHARGE CONDITIONS:  stable CONSULTS OBTAINED:  Treatment Team:  Manya Silvas, MD DRUG ALLERGIES:  No Known Allergies DISCHARGE MEDICATIONS:   Discharge Medication List as of 08/29/2015 11:47 AM    START taking these medications   Details  vancomycin (VANCOCIN) 50 mg/mL oral solution Take 5 mLs (250 mg total) by mouth every 6 (six) hours. For 1 more week supply (please supply accordingly), Starting 08/29/2015, Until Wed 09/05/15, Normal      CONTINUE these medications which have NOT CHANGED   Details  folic acid (FOLVITE) 1 MG tablet Take 1 tablet (1 mg total) by mouth daily., Starting 05/17/2015, Until Discontinued, Normal    furosemide (LASIX) 40 MG tablet Take 1 tablet (40 mg total) by mouth daily., Starting 05/17/2015, Until Discontinued, Normal    HYDROcodone-acetaminophen (NORCO/VICODIN) 5-325 MG per tablet Take 1 tablet by mouth 3 (three) times daily as needed for moderate pain., Starting 07/09/2015, Until Discontinued, Print    levothyroxine (SYNTHROID, LEVOTHROID) 25 MCG tablet Take 1 tablet (25 mcg total) by mouth daily before breakfast., Starting 07/09/2015, Until Discontinued, No Print    loperamide (IMODIUM) 2 MG capsule Take 1 capsule (2 mg total) by mouth 3 (three) times daily., Starting 06/21/2015, Until Discontinued, Print    magnesium 30 MG tablet Take 1 tablet (30 mg total) by mouth 2 (two) times daily., Starting 06/21/2015, Until Discontinued, Print    magnesium oxide (MAG-OX) 400 (241.3 MG) MG tablet Take 1 tablet (400 mg total) by mouth daily., Starting 07/09/2015, Until Discontinued, No  Print    nadolol (CORGARD) 40 MG tablet Take 1 tablet (40 mg total) by mouth daily., Starting 05/17/2015, Until Discontinued, Normal    pantoprazole (PROTONIX) 40 MG tablet Take 1 tablet (40 mg total) by mouth daily., Starting 06/21/2015, Until Discontinued, Print    potassium chloride SA (K-DUR,KLOR-CON) 20 MEQ tablet Take 2 tablets (40 mEq total) by mouth 2 (two) times  daily., Starting 05/17/2015, Until Discontinued, Normal    predniSONE (DELTASONE) 20 MG tablet Take 2 tablets (40 mg total) by mouth daily with breakfast., Starting 07/09/2015, Until Discontinued, No Print    pregabalin (LYRICA) 150 MG capsule Take 150 mg by mouth 2 (two) times daily as needed (for nerve pain)., Until Discontinued, Historical Med    QUEtiapine (SEROQUEL) 50 MG tablet Take 50 mg by mouth at bedtime as needed (for sleep)., Until Discontinued, Historical Med    saccharomyces boulardii (FLORASTOR) 250 MG capsule Take 1 capsule (250 mg total) by mouth 2 (two) times daily., Starting 06/21/2015, Until Discontinued, Print    thiamine 100 MG tablet Take 1 tablet (100 mg total) by mouth daily., Starting 05/17/2015, Until Discontinued, Normal    zinc oxide 20 % ointment Apply topically 2 (two) times daily as needed for irritation. Apply to button, Starting 06/21/2015, Until Discontinued, Print       DISCHARGE INSTRUCTIONS:   DIET:  Regular diet DISCHARGE CONDITION:  Good ACTIVITY:  Activity as tolerated OXYGEN:  Home Oxygen: No.  Oxygen Delivery: room air DISCHARGE LOCATION:  home   If you experience worsening of your admission symptoms, develop shortness of breath, life threatening emergency, suicidal or homicidal thoughts you must seek medical attention immediately by calling 911 or calling your MD immediately  if symptoms less severe.  You Must read complete instructions/literature along with all the possible adverse reactions/side effects for all the Medicines you take and that have been prescribed to you. Take any new Medicines after you have completely understood and accpet all the possible adverse reactions/side effects.   Please note  You were cared for by a hospitalist during your hospital stay. If you have any questions about your discharge medications or the care you received while you were in the hospital after you are discharged, you can call the unit and asked to speak  with the hospitalist on call if the hospitalist that took care of you is not available. Once you are discharged, your primary care physician will handle any further medical issues. Please note that NO REFILLS for any discharge medications will be authorized once you are discharged, as it is imperative that you return to your primary care physician (or establish a relationship with a primary care physician if you do not have one) for your aftercare needs so that they can reassess your need for medications and monitor your lab values.    On the day of Discharge: VITAL SIGNS:  Blood pressure 106/60, pulse 67, temperature 98.6 F (37 C), temperature source Oral, resp. rate 18, height 5\' 8"  (1.727 m), weight 217 lb 14.4 oz (98.839 kg), SpO2 100 %. PHYSICAL EXAMINATION:  GENERAL:  44 y.o.-year-old patient lying in the bed with no acute distress.  EYES: Pupils equal, round, reactive to light and accommodation. No scleral icterus. Extraocular muscles intact.  HEENT: Head atraumatic, normocephalic. Oropharynx and nasopharynx clear.  NECK:  Supple, no jugular venous distention. No thyroid enlargement, no tenderness.  LUNGS: Normal breath sounds bilaterally, no wheezing, rales,rhonchi or crepitation. No use of accessory muscles of respiration.  CARDIOVASCULAR: S1, S2  normal. No murmurs, rubs, or gallops.  ABDOMEN: Soft, non-tender, non-distended. Bowel sounds present. No organomegaly or mass.  EXTREMITIES: No pedal edema, cyanosis, or clubbing.  NEUROLOGIC: Cranial nerves II through XII are intact. Muscle strength 5/5 in all extremities. Sensation intact. Gait not checked.  PSYCHIATRIC: The patient is alert and oriented x 3.  SKIN: No obvious rash, lesion, or ulcer.  DATA REVIEW:   CBC  Recent Labs Lab 08/29/15 0612  WBC 3.8  HGB 8.9*  HCT 27.0*  PLT 158    Chemistries   Recent Labs Lab 08/29/15 0612  NA 142  K 3.6  CL 115*  CO2 22  GLUCOSE 135*  BUN 7  CREATININE 0.56*  CALCIUM  8.0*  MG 1.4*    Microbiology Results  Results for orders placed or performed during the hospital encounter of 08/20/15  C difficile quick scan w PCR reflex     Status: Abnormal   Collection Time: 08/20/15  7:45 PM  Result Value Ref Range Status   C Diff antigen POSITIVE (A) NEGATIVE Final   C Diff toxin POSITIVE (A) NEGATIVE Final   C Diff interpretation   Final    Positive for toxigenic C. difficile, active toxin production present.    Comment: CRITICAL RESULT CALLED TO, READ BACK BY AND VERIFIED WITH: LAUREN CLOUDEN AT 2237 08/20/15 SDR   Blood culture (routine x 2)     Status: None   Collection Time: 08/20/15 10:09 PM  Result Value Ref Range Status   Specimen Description BLOOD LEFT ANTECUBITAL  Final   Special Requests BOTTLES DRAWN AEROBIC AND ANAEROBIC 6ML  Final   Culture  Setup Time   Final    GRAM POSITIVE COCCI IN CLUSTERS ANAEROBIC BOTTLE ONLY CRITICAL RESULT CALLED TO, READ BACK BY AND VERIFIED WITH: KAYLA PRICE AT 0940 08/22/15 CTJ    Culture   Final    COAGULASE NEGATIVE STAPHYLOCOCCUS ANAEROBIC BOTTLE ONLY Results consistent with contamination.    Report Status 08/25/2015 FINAL  Final  Blood culture (routine x 2)     Status: None   Collection Time: 08/20/15 10:09 PM  Result Value Ref Range Status   Specimen Description BLOOD RIGHT ANTECUBITAL  Final   Special Requests BOTTLES DRAWN AEROBIC AND ANAEROBIC 10ML  Final   Culture NO GROWTH 5 DAYS  Final   Report Status 08/25/2015 FINAL  Final  Urine culture     Status: None   Collection Time: 08/21/15  1:55 AM  Result Value Ref Range Status   Specimen Description URINE, RANDOM  Final   Special Requests Normal  Final   Culture >=100,000 COLONIES/mL ESCHERICHIA COLI  Final   Report Status 08/23/2015 FINAL  Final   Organism ID, Bacteria ESCHERICHIA COLI  Final      Susceptibility   Escherichia coli - MIC*    AMPICILLIN <=2 SENSITIVE Sensitive     CEFTAZIDIME <=1 SENSITIVE Sensitive     CEFAZOLIN <=4 SENSITIVE  Sensitive     CEFTRIAXONE <=1 SENSITIVE Sensitive     CIPROFLOXACIN <=0.25 SENSITIVE Sensitive     GENTAMICIN <=1 SENSITIVE Sensitive     IMIPENEM <=0.25 SENSITIVE Sensitive     TRIMETH/SULFA <=20 SENSITIVE Sensitive     NITROFURANTOIN Value in next row Sensitive      SENSITIVE<=16    PIP/TAZO Value in next row Sensitive      SENSITIVE<=4    * >=100,000 COLONIES/mL ESCHERICHIA COLI     Management plans discussed with the patient and he is in agreement.  CODE STATUS: Full Code  TOTAL TIME TAKING CARE OF THIS PATIENT: 55 minutes.    [facility], Lee-Ann Gal M.D on 08/30/2015 at 10:51 AM  Between 7am to 6pm - Pager - [phone]  After 6pm go to [email or web address] - password [email or web address] [facility]  Office  [phone]  CC: Primary care physician; Sherrin Daisy MD Gaylyn Cheers MD

## 2015-09-10 ENCOUNTER — Ambulatory Visit: Payer: 59 | Admitting: *Deleted

## 2015-09-10 ENCOUNTER — Encounter
Admission: RE | Disposition: A | Discharge status: Home / Self Care | Payer: Self-pay | Source: Ambulatory Visit | Attending: Unknown Physician Specialty

## 2015-09-10 ENCOUNTER — Encounter: Payer: Self-pay | Admitting: *Deleted

## 2015-09-10 ENCOUNTER — Ambulatory Visit
Admission: RE | Admit: 2015-09-10 | Discharge: 2015-09-10 | Disposition: A | Discharge status: Home / Self Care | Payer: 59 | Source: Ambulatory Visit | Attending: Unknown Physician Specialty | Admitting: Unknown Physician Specialty

## 2015-09-10 DIAGNOSIS — I10 Essential (primary) hypertension: Secondary | ICD-10-CM | POA: Insufficient documentation

## 2015-09-10 DIAGNOSIS — A047 Enterocolitis due to Clostridium difficile: Secondary | ICD-10-CM | POA: Insufficient documentation

## 2015-09-10 DIAGNOSIS — E039 Hypothyroidism, unspecified: Secondary | ICD-10-CM | POA: Diagnosis not present

## 2015-09-10 DIAGNOSIS — F1020 Alcohol dependence, uncomplicated: Secondary | ICD-10-CM | POA: Insufficient documentation

## 2015-09-10 DIAGNOSIS — K7030 Alcoholic cirrhosis of liver without ascites: Secondary | ICD-10-CM | POA: Insufficient documentation

## 2015-09-10 DIAGNOSIS — F17210 Nicotine dependence, cigarettes, uncomplicated: Secondary | ICD-10-CM | POA: Insufficient documentation

## 2015-09-10 HISTORY — PX: FECAL TRANSPLANT: SHX6383

## 2015-09-10 SURGERY — FECAL MICROBIOTA TRANSFER
Anesthesia: General

## 2015-09-10 MED ORDER — MIDAZOLAM HCL 5 MG/5ML IJ SOLN
INTRAMUSCULAR | Status: DC | PRN
  Administered 2015-09-10: 1 mg via INTRAVENOUS

## 2015-09-10 MED ORDER — PROPOFOL 10 MG/ML IV BOLUS
INTRAVENOUS | Status: DC | PRN
  Administered 2015-09-10: 50 mg via INTRAVENOUS

## 2015-09-10 MED ORDER — PROPOFOL 500 MG/50ML IV EMUL
INTRAVENOUS | Status: DC | PRN
  Administered 2015-09-10: 160 ug/kg/min via INTRAVENOUS

## 2015-09-10 MED ORDER — FENTANYL CITRATE (PF) 100 MCG/2ML IJ SOLN
INTRAMUSCULAR | Status: DC | PRN
  Administered 2015-09-10: 50 ug via INTRAVENOUS

## 2015-09-10 MED ORDER — SODIUM CHLORIDE 0.9 % IV SOLN
INTRAVENOUS | Status: DC
  Administered 2015-09-10: 11:00:00 via INTRAVENOUS

## 2015-09-10 NOTE — Op Note (Signed)
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 Procedure:         Pre-Anesthesia Assessment:                    - After reviewing the risks and benefits, the patient was                     deemed in satisfactory condition to undergo the procedure.                    After obtaining informed consent, the colonoscope was                     passed under direct vision. Throughout the procedure, the                     patient's blood pressure, pulse, and oxygen saturations                     were monitored continuously. The Colonoscope was                     introduced through the anus and advanced to the the cecum,                     identified by appendiceal orifice and ileocecal valve. The                     colonoscopy was performed without difficulty. The patient                     tolerated the procedure well. The quality of the bowel                     preparation was good. The patient tolerated the procedure                     well. Findings:      Fecal Microbiota Transplant (Bacteriotherapy): Donor stool was prepared       by me using milk as per protocol. Approximately 400 mL of the emulsified       donor stool was instilled in the transverse colon, at the hepatic       flexure, in the ascending colon and in the cecum. A detailed       colonoscopic exam could not  be performed upon scope withdrawal secondary       to limited visibility from the instilled stool. The colon had some semi  liquid stool present which was lavaged and suctioned out of the colon.       The mucosa of the colon was not inflammed due to recent antibiotic. Impression:        - Fecal Microbiota Transplant (Bacteriotherapy) performed                     in the transverse colon, in the hepatic flexure, in the                     ascending colon and in the cecum.                    - No specimens collected. Recommendation:    - The findings and recommendations were discussed with the                     patient. Manya Silvas, MD 09/10/2015 12:23:42 PM This report has been signed electronically. Number of Addenda: 0 Note Initiated On: 09/10/2015 11:28 AM Total Procedure Duration: 0 hours 17 minutes 26 seconds       [facility]

## 2015-09-10 NOTE — Transfer of Care (Signed)
Immediate Anesthesia Transfer of Care Note  Patient: Troy [name]  Procedure(s) Performed: Procedure(s): FECAL TRANSPLANT (N/A)  Patient Location: PACU  Anesthesia Type:General  Level of Consciousness: awake, alert , oriented and patient cooperative  Airway & Oxygen Therapy: Patient Spontanous Breathing and Patient connected to nasal cannula oxygen  Post-op Assessment: Report given to RN and Post -op Vital signs reviewed and stable  Post vital signs: Reviewed and stable  Last Vitals:  Filed Vitals:   09/10/15 1158  BP: 95/64  Pulse:   Temp: 36.6 C  Resp: 12    Complications: No apparent anesthesia complications

## 2015-09-10 NOTE — Anesthesia Preprocedure Evaluation (Addendum)
Anesthesia Evaluation  Patient identified by MRN, date of birth, ID band Patient awake    Reviewed: Allergy & Precautions, NPO status , Patient's Chart, lab work & pertinent test results  Airway Mallampati: III  TM Distance: <3 FB Neck ROM: Limited    Dental  (+) Teeth Intact   Pulmonary Current Smoker,    Pulmonary exam normal        Cardiovascular Exercise Tolerance: Poor hypertension, Pt. on medications and Pt. on home beta blockers Normal cardiovascular exam     Neuro/Psych Seizures -,  Ethanol withdrawal seizure in the past.    GI/Hepatic GERD  Medicated and Controlled,(+) Cirrhosis   ascites    , Hepatitis -, CC.diff. diarrhea.   Endo/Other  Hypothyroidism treated  Renal/GU      Musculoskeletal   Abdominal (+) + obese,   Peds  Hematology  (+) anemia , Hb 8.9.   Anesthesia Other Findings Has been off prednisone for weeks now.  Reproductive/Obstetrics                            Anesthesia Physical Anesthesia Plan  ASA: IV  Anesthesia Plan: General   Post-op Pain Management:    Induction: Intravenous  Airway Management Planned: Nasal Cannula  Additional Equipment:   Intra-op Plan:   Post-operative Plan:   Informed Consent: I have reviewed the patients History and Physical, chart, labs and discussed the procedure including the risks, benefits and alternatives for the proposed anesthesia with the patient or authorized representative who has indicated his/her understanding and acceptance.     Plan Discussed with: CRNA  Anesthesia Plan Comments:         Anesthesia Quick Evaluation

## 2015-09-10 NOTE — H&P (Signed)
Primary Care Physician:  No PCP Per Patient Primary Gastroenterologist:  Dr. Vira Agar  Pre-Procedure History & Physical: HPI:  Troy [name] is a 44 y.o. male is here for an colonoscopy.   Past Medical History  Diagnosis Date  . Liver damage   . Hypertension   . Neuropathy   . Seizures     complicated ETOH withdrawal  . Pancytopenia, acquired   . Ascites due to alcoholic cirrhosis   . DTs (delirium tremens)   . Hypothyroidism   . Hepatitis C   . C. difficile diarrhea     Past Surgical History  Procedure Laterality Date  . Joint replacement      Left hip replacement  . Esophagogastroduodenoscopy (egd) with propofol N/A 07/05/2015    Procedure: ESOPHAGOGASTRODUODENOSCOPY (EGD) WITH PROPOFOL;  Surgeon: Josefine Class, MD;  Location: [facility] ENDOSCOPY;  Service: Endoscopy;  Laterality: N/A;  . Colonoscopy with propofol N/A 07/05/2015    Procedure: COLONOSCOPY WITH PROPOFOL;  Surgeon: Josefine Class, MD;  Location: [facility] ENDOSCOPY;  Service: Endoscopy;  Laterality: N/A;    Prior to Admission medications   Medication Sig Start Date End Date Taking? Authorizing Provider  folic acid (FOLVITE) 1 MG tablet Take 1 tablet (1 mg total) by mouth daily. 05/17/15  Yes Epifanio Lesches, MD  furosemide (LASIX) 40 MG tablet Take 1 tablet (40 mg total) by mouth daily. 05/17/15  Yes Epifanio Lesches, MD  HYDROcodone-acetaminophen (NORCO/VICODIN) 5-325 MG per tablet Take 1 tablet by mouth 3 (three) times daily as needed for moderate pain. 07/09/15  Yes Fritzi Mandes, MD  levothyroxine (SYNTHROID, LEVOTHROID) 25 MCG tablet Take 1 tablet (25 mcg total) by mouth daily before breakfast. 07/09/15  Yes Fritzi Mandes, MD  loperamide (IMODIUM) 2 MG capsule Take 1 capsule (2 mg total) by mouth 3 (three) times daily. 06/21/15  Yes Demetrios Loll, MD  magnesium 30 MG tablet Take 1 tablet (30 mg total) by mouth 2 (two) times daily. 06/21/15  Yes Demetrios Loll, MD  magnesium oxide (MAG-OX) 400 (241.3 MG) MG tablet Take 1  tablet (400 mg total) by mouth daily. 07/09/15  Yes Fritzi Mandes, MD  nadolol (CORGARD) 40 MG tablet Take 1 tablet (40 mg total) by mouth daily. 05/17/15  Yes Epifanio Lesches, MD  pantoprazole (PROTONIX) 40 MG tablet Take 1 tablet (40 mg total) by mouth daily. 06/21/15  Yes Demetrios Loll, MD  potassium chloride SA (K-DUR,KLOR-CON) 20 MEQ tablet Take 2 tablets (40 mEq total) by mouth 2 (two) times daily. 05/17/15  Yes Epifanio Lesches, MD  predniSONE (DELTASONE) 20 MG tablet Take 2 tablets (40 mg total) by mouth daily with breakfast. 07/09/15  Yes Fritzi Mandes, MD  pregabalin (LYRICA) 150 MG capsule Take 150 mg by mouth 2 (two) times daily as needed (for nerve pain).   Yes Historical Provider, MD  QUEtiapine (SEROQUEL) 50 MG tablet Take 50 mg by mouth at bedtime as needed (for sleep).   Yes Historical Provider, MD  saccharomyces boulardii (FLORASTOR) 250 MG capsule Take 1 capsule (250 mg total) by mouth 2 (two) times daily. 06/21/15  Yes Demetrios Loll, MD  thiamine 100 MG tablet Take 1 tablet (100 mg total) by mouth daily. 05/17/15  Yes Epifanio Lesches, MD  zinc oxide 20 % ointment Apply topically 2 (two) times daily as needed for irritation. Apply to button 06/21/15  Yes Demetrios Loll, MD    Allergies as of 09/03/2015  . (No Known Allergies)    Family History  Problem Relation Age of Onset  .  Crohn's disease Father     Social History   Social History  . Marital Status: Married    Spouse Name: N/A  . Number of Children: N/A  . Years of Education: N/A   Occupational History  . Not on file.   Social History Main Topics  . Smoking status: Current Every Day Smoker -- 0.50 packs/day for 20 years    Types: Cigarettes  . Smokeless tobacco: Not on file     Comment: stopped in 2005, restarted one month ago  . Alcohol Use: 21.6 oz/week    36 Standard drinks or equivalent per week     Comment: hx of chronic ETOH abuse. Pint a day.   . Drug Use: No  . Sexual Activity: Not on file   Other Topics Concern   . Not on file   Social History Narrative    Review of Systems: See HPI, otherwise negative ROS  Physical Exam: BP 123/94 mmHg  Pulse 108  Temp(Src) 97.9 F (36.6 C) (Tympanic)  Resp 19  SpO2 100% General:   Alert,  pleasant and cooperative in NAD Head:  Normocephalic and atraumatic. Neck:  Supple; no masses or thyromegaly. Lungs:  Clear throughout to auscultation.    Heart:  Regular rate and rhythm. Abdomen:  Soft, nontender and nondistended. Normal bowel sounds, without guarding, and without rebound.   Neurologic:  Alert and  oriented x4;  grossly normal neurologically.  Impression/Plan: Malvin Johns is here for an colonoscopy to be performed for stool transplant for treatment of recurrent C. Difficile colitis  Risks, benefits, limitations, and alternatives regarding  colonoscopy have been reviewed with the patient.  Questions have been answered.  All parties agreeable.   Gaylyn Cheers, MD  09/10/2015, 1:18 PM

## 2015-09-10 NOTE — Anesthesia Postprocedure Evaluation (Signed)
  Anesthesia Post-op Note  Patient: Troy [name]  Procedure(s) Performed: Procedure(s): FECAL TRANSPLANT (N/A)  Anesthesia type:General  Patient location: PACU  Post pain: Pain level controlled  Post assessment: Post-op Vital signs reviewed, Patient's Cardiovascular Status Stable, Respiratory Function Stable, Patent Airway and No signs of Nausea or vomiting  Post vital signs: Reviewed and stable  Last Vitals:  Filed Vitals:   09/10/15 1208  BP: 111/73  Pulse: 106  Temp:   Resp: 13    Level of consciousness: awake, alert  and patient cooperative  Complications: No apparent anesthesia complications

## 2015-09-11 ENCOUNTER — Encounter: Payer: Self-pay | Admitting: Unknown Physician Specialty

## 2015-10-16 DEATH — deceased

## 2015-11-15 IMAGING — CR DG CHEST 1V PORT
1 series · 1 of 1 positions shown · non-contrast
Comparison: 05/13/2015

CLINICAL DATA: Altered mental status.  Diarrhea.

EXAM:
PORTABLE CHEST - 1 VIEW

[portable]
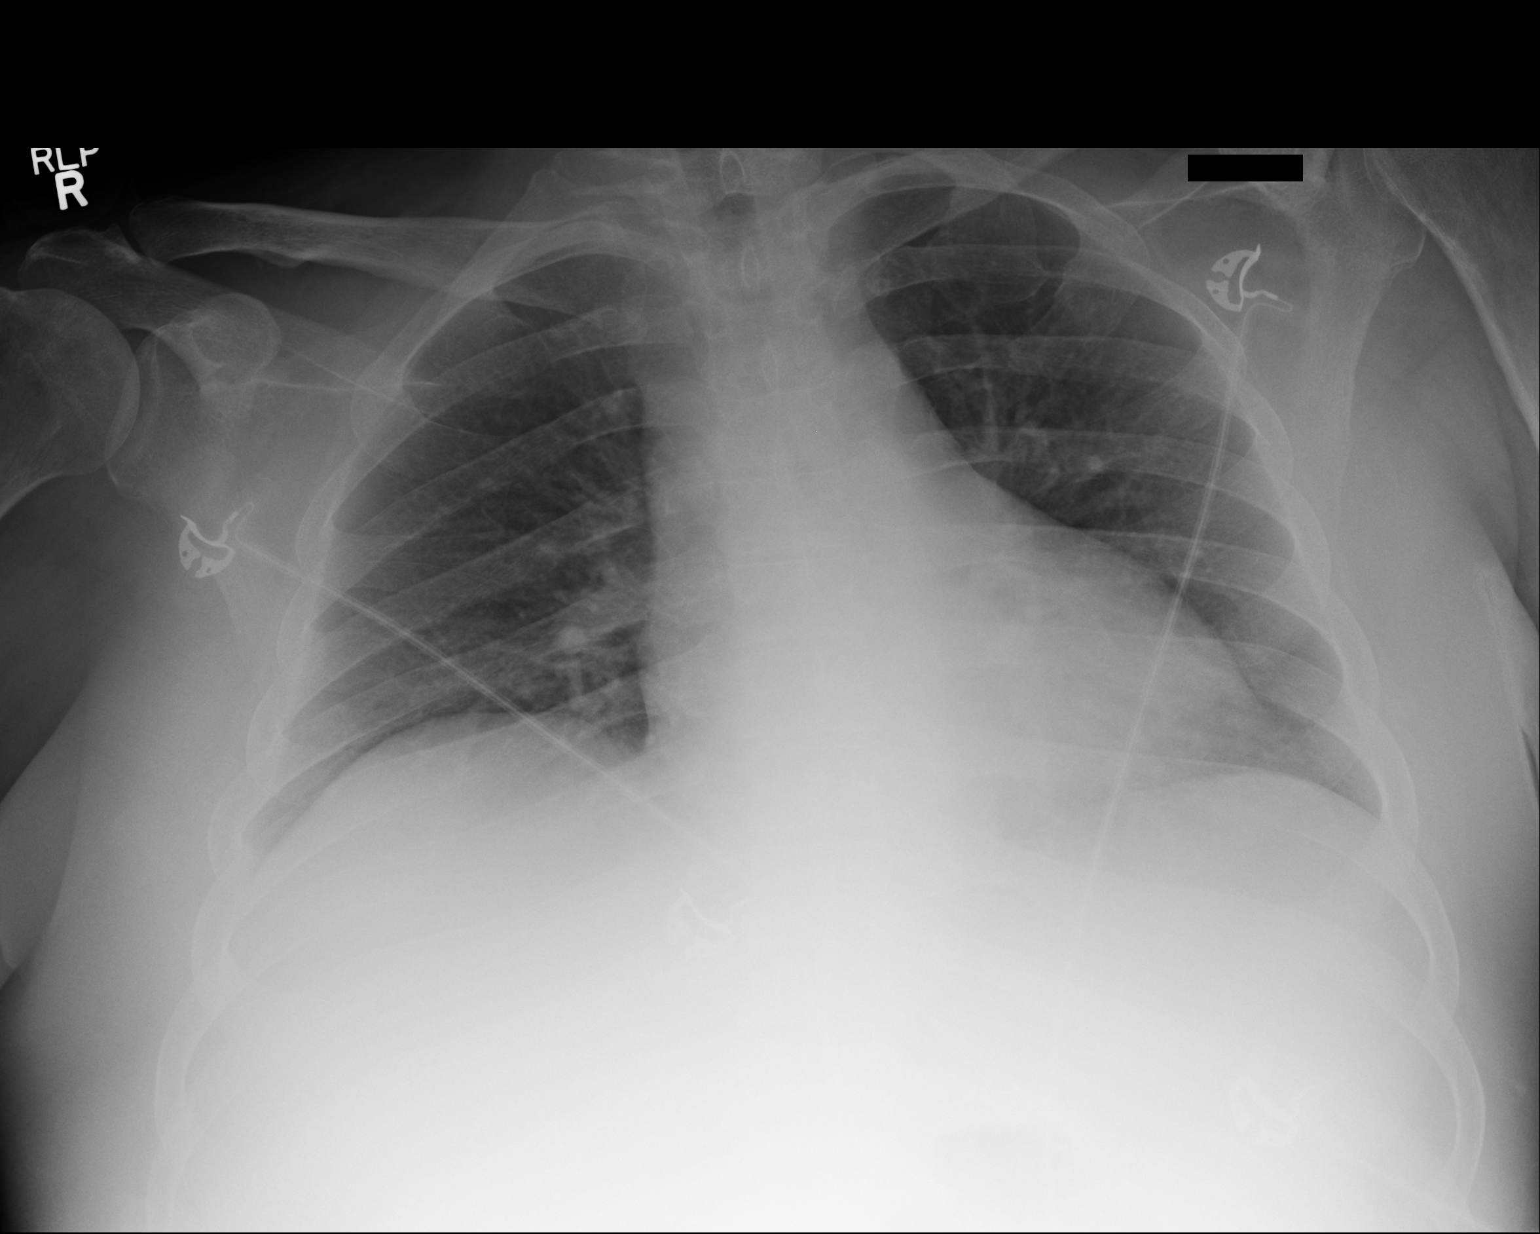

[1 of 1 positions shown; findings below may reference images not displayed]

FINDINGS: The heart size and mediastinal contours are within normal limits.
Both lungs are clear. The visualized skeletal structures are
unremarkable.
IMPRESSION: Normal exam.

## 2015-11-17 IMAGING — US US ABDOMEN LIMITED
1 series · 14 of 16 positions shown · non-contrast
Comparison: May 15, 2015.

CLINICAL DATA: Cirrhosis of liver.

EXAM:
LIMITED ABDOMEN ULTRASOUND FOR ASCITES
TECHNIQUE: Limited ultrasound survey for ascites was performed in all four
abdominal quadrants.

[Series 1: us abdomen limited · 0.31mm/px · 14 of 16 slices shown]
[im 1/16]
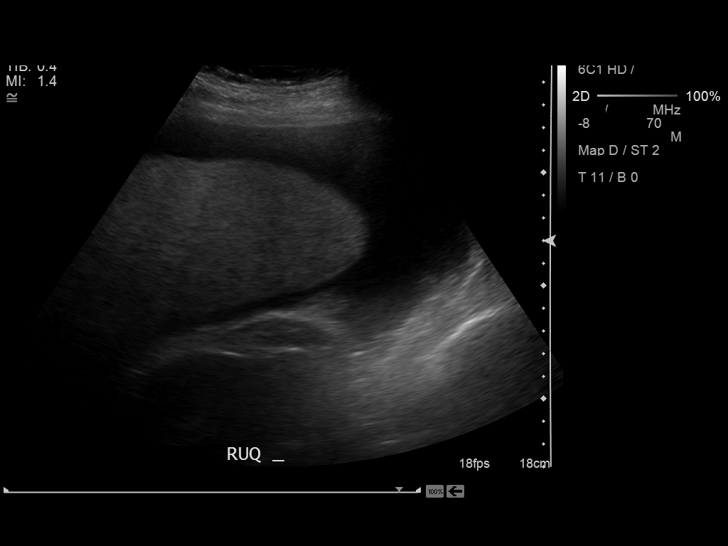
[im 2/16]
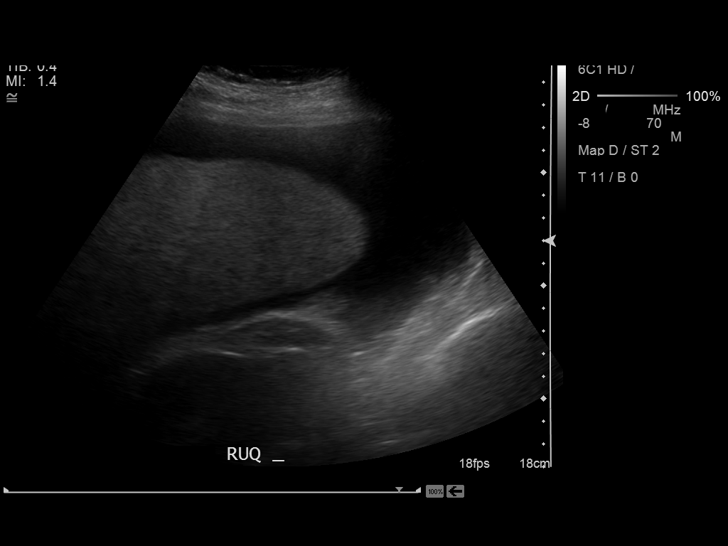
[im 3/16]
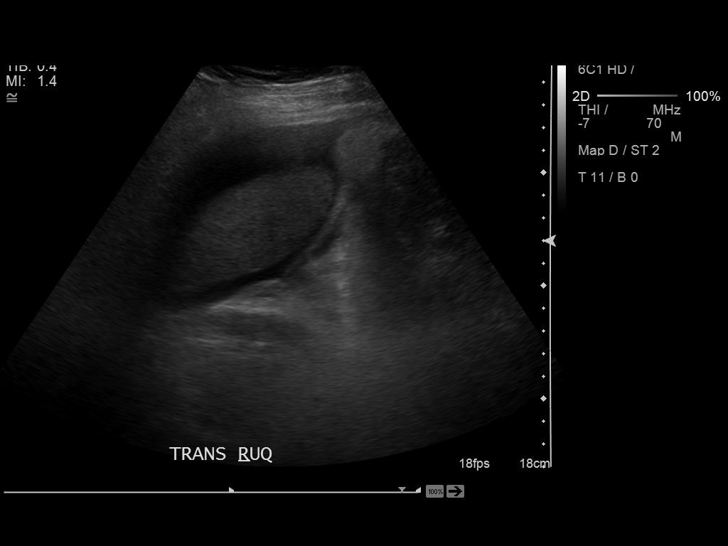
[im 5/16]
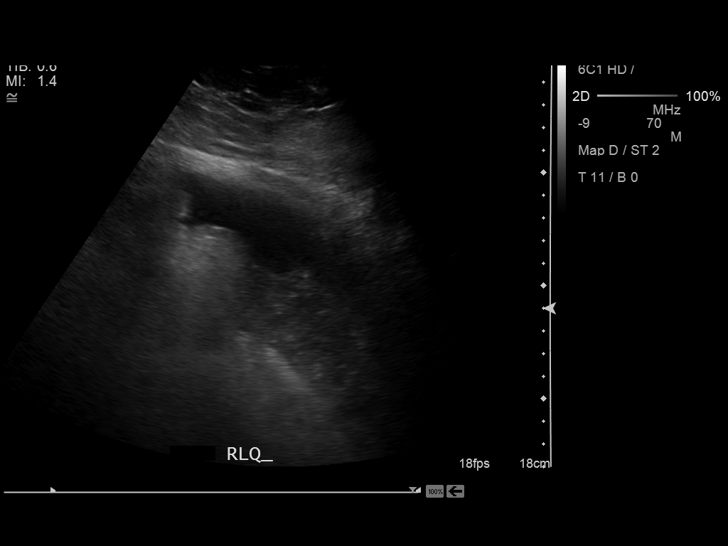
[im 6/16]
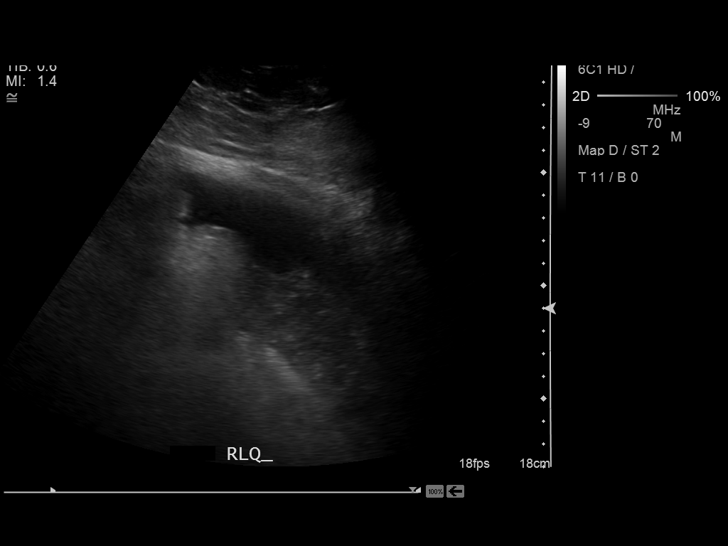
[im 7/16]
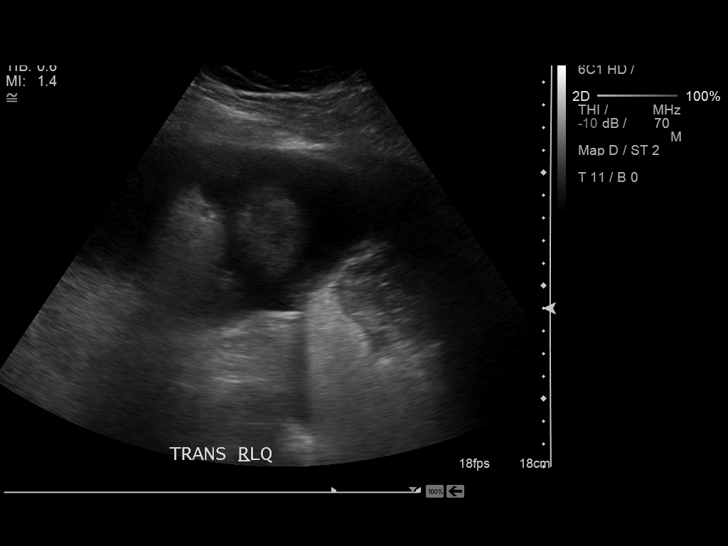
[im 8/16]
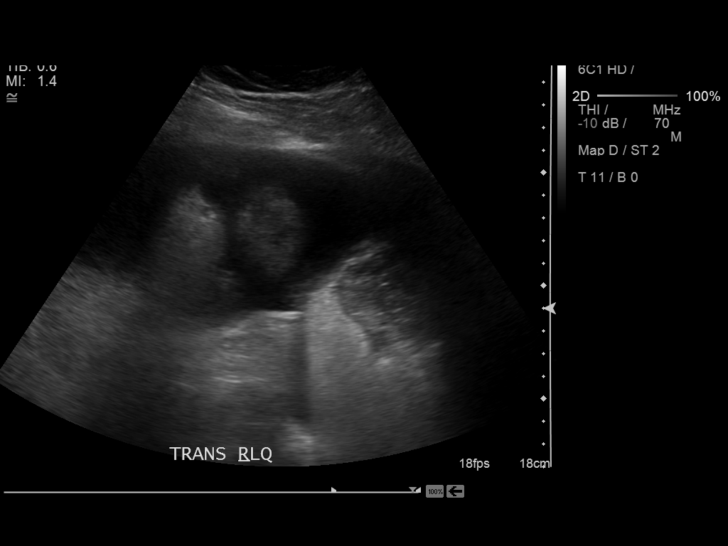
[im 9/16]
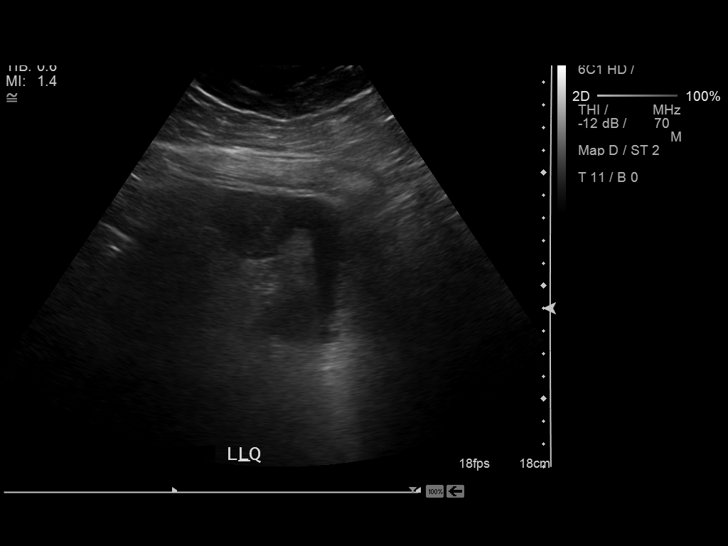
[im 10/16]
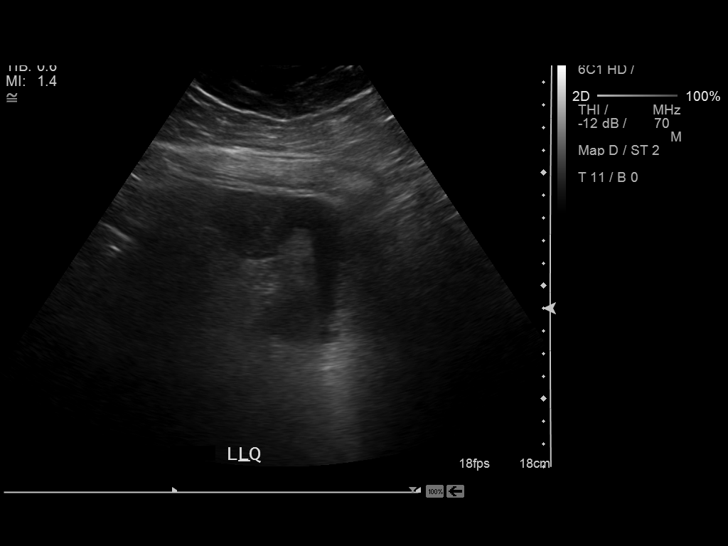
[im 11/16]
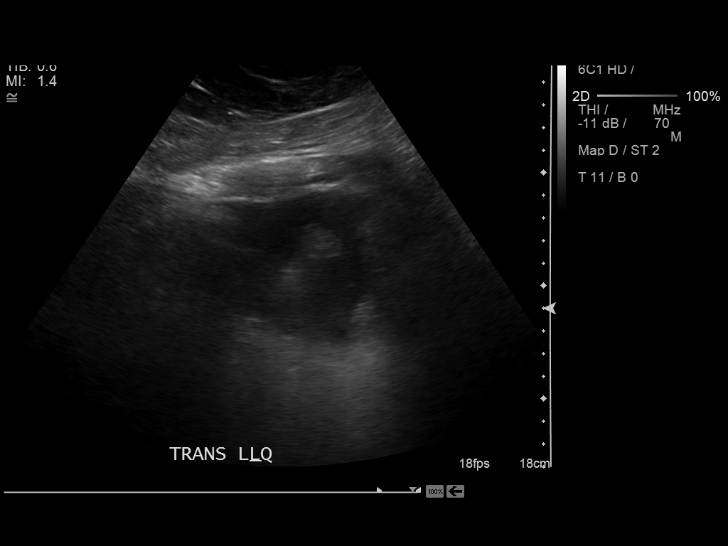
[im 13/16]
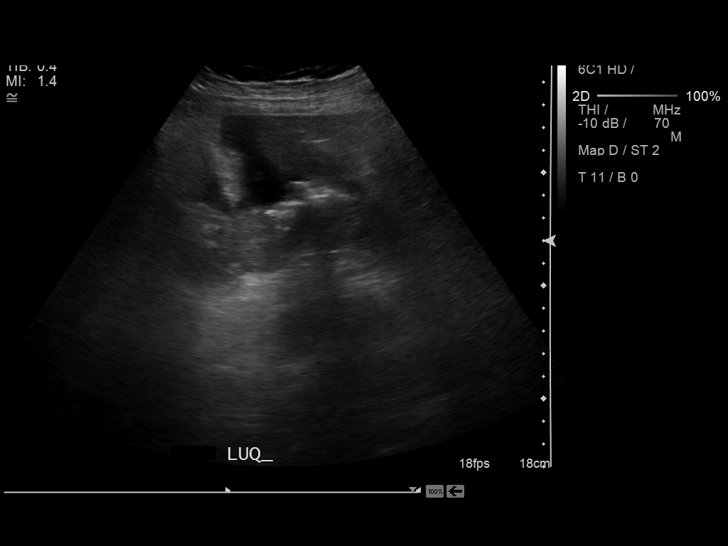
[im 14/16]
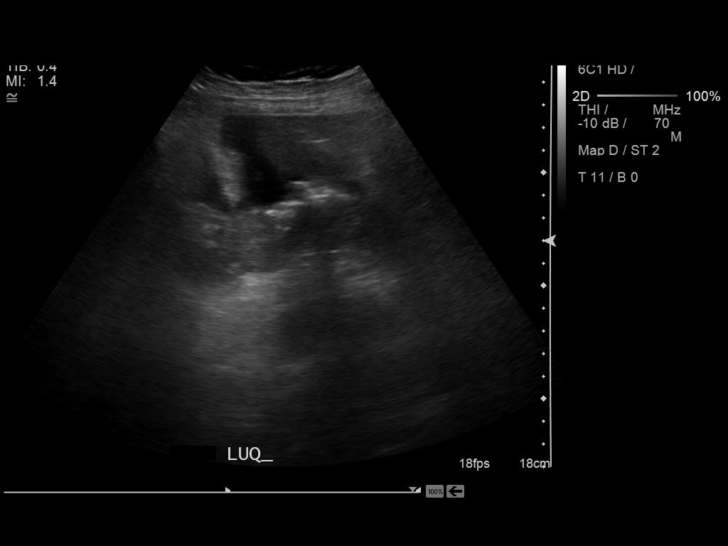
[im 15/16]
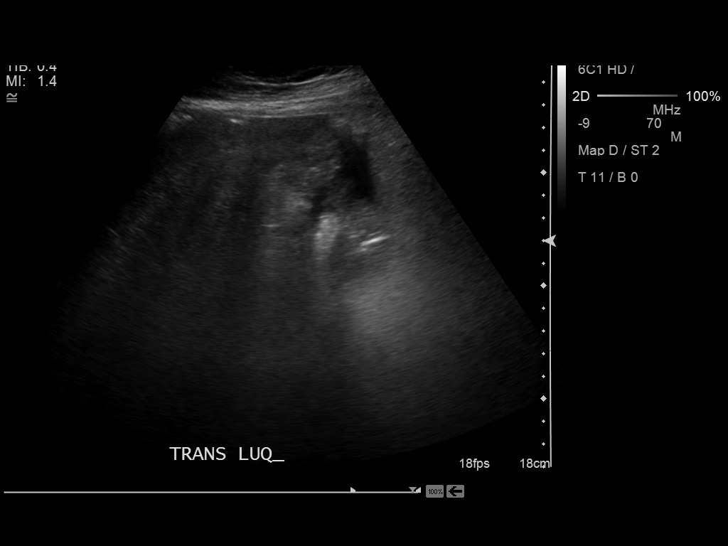
[im 16/16]
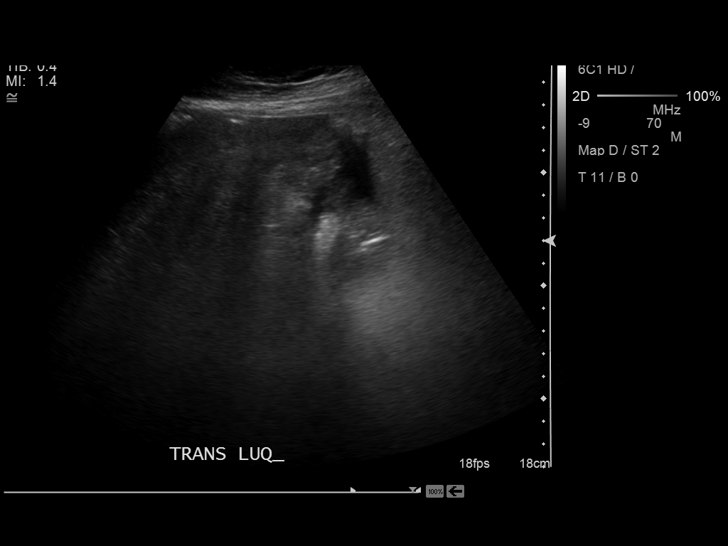

[14 of 16 positions shown; findings below may reference images not displayed]

FINDINGS: Mild ascites is noted around the liver and the right upper quadrant.
Only a minimal to mild amount ascites is noted in the other 3
quadrants of the abdomen.
IMPRESSION: Minimal to mild ascites is noted.

## 2015-11-21 IMAGING — CR DG ABDOMEN 1V
1 series · 2 of 2 positions shown · non-contrast
Comparison: CT abdomen and pelvis February 22, 2014

CLINICAL DATA: Abdominal pain

EXAM:
ABDOMEN - 1 VIEW

[Series 1: ap · 0.17mm/px · 2 of 2 slices shown]
[im 1/2]
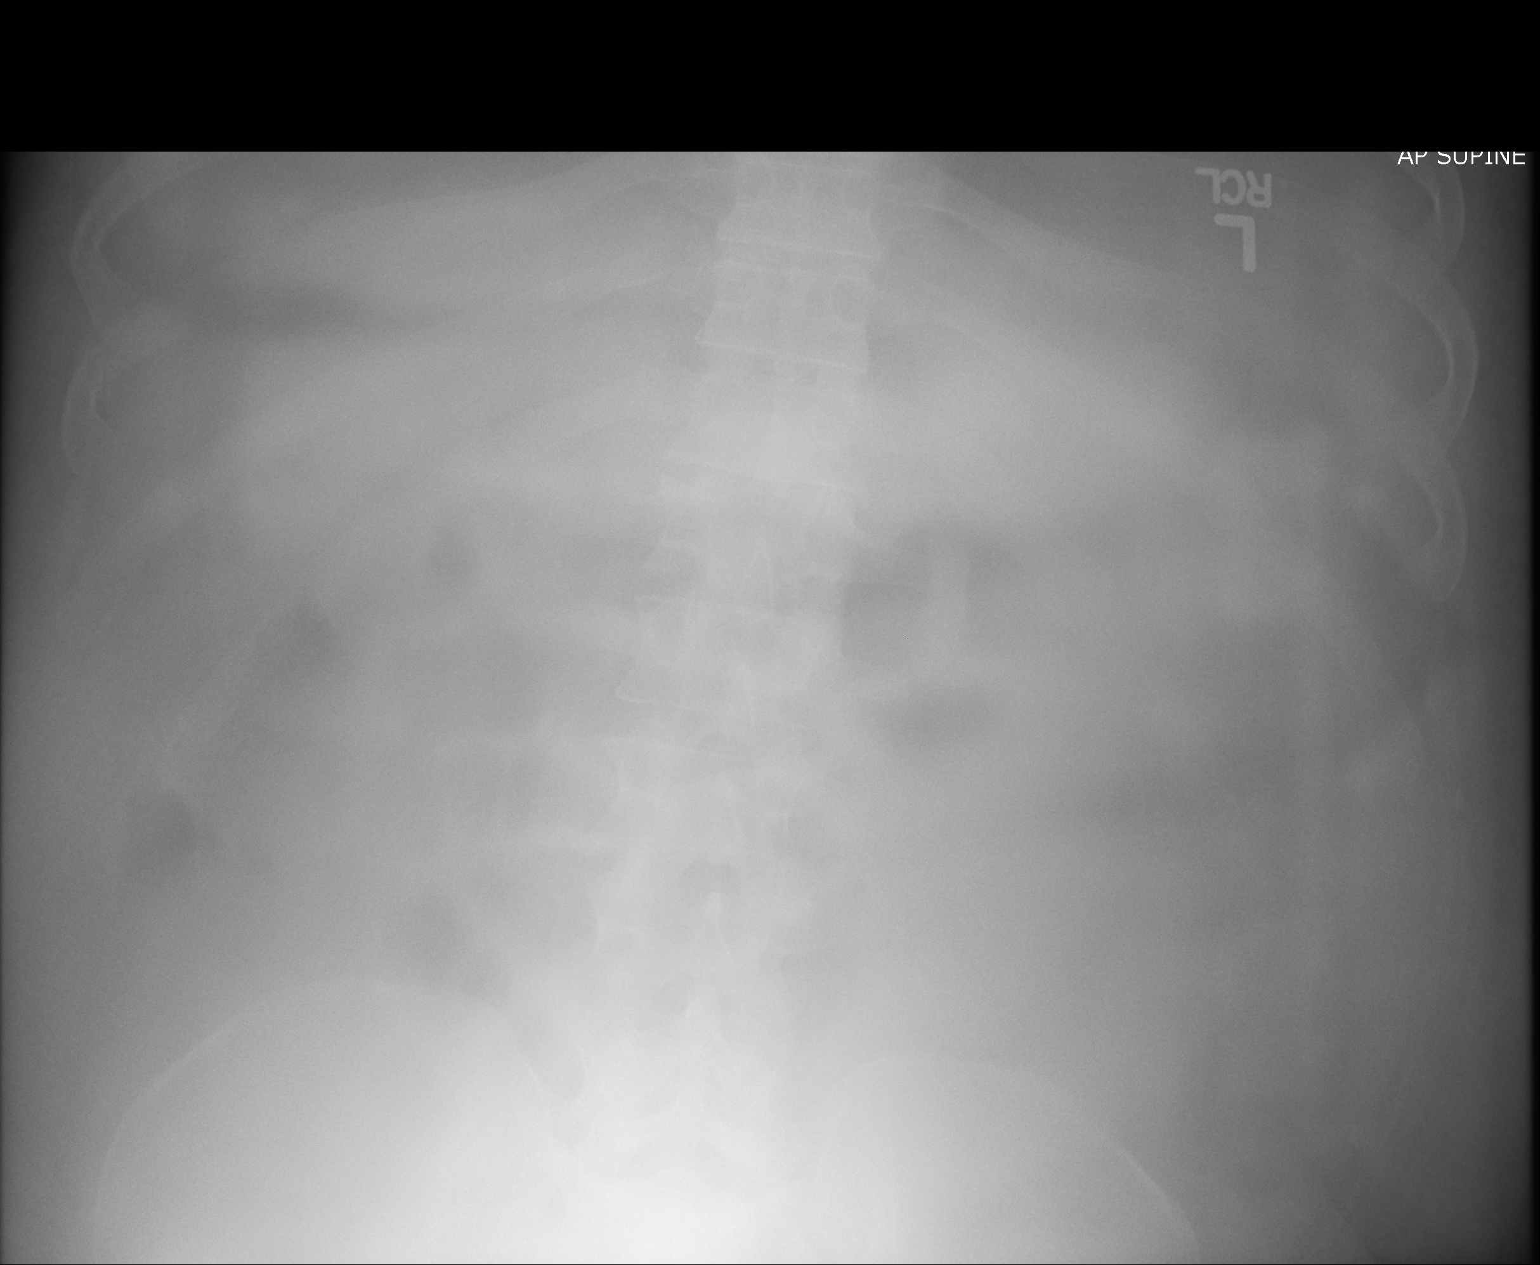
[im 2/2]
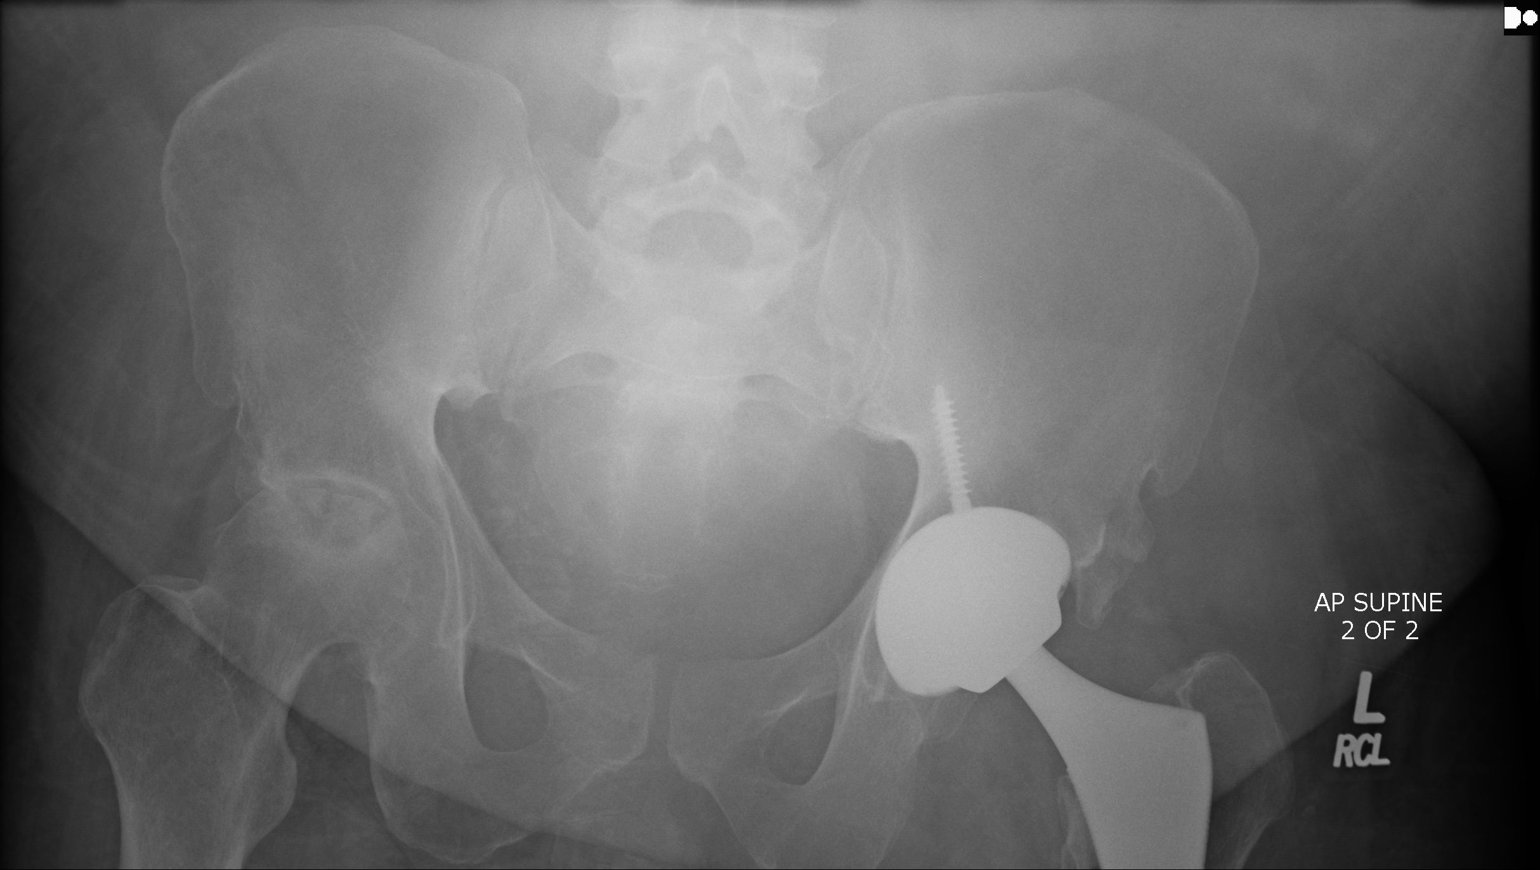

[2 of 2 positions shown; findings below may reference images not displayed]

FINDINGS: There is no bowel dilatation or air-fluid level suggesting
obstruction. No free air is seen on this supine examination. There
is a total hip replacement on the left. There is extensive avascular
necrosis in the right femoral head.
IMPRESSION: Bowel gas pattern unremarkable. No demonstrable obstruction or free
air. Total hip prosthesis on the left. Extensive avascular necrosis
right femoral head.

## 2015-12-10 IMAGING — CR DG HIP (WITH OR WITHOUT PELVIS) 2-3V*R*
1 series · 3 of 3 positions shown · non-contrast
Comparison: None.

CLINICAL DATA: Right hip pain following recent fall, initial
encounter

EXAM:
DG HIP (WITH OR WITHOUT PELVIS) 2-3V RIGHT

[Series 1: dg hip unilat with pelvis 2-3 views righ · 0.14mm/px · 3 of 3 slices shown]
[im 1/3]
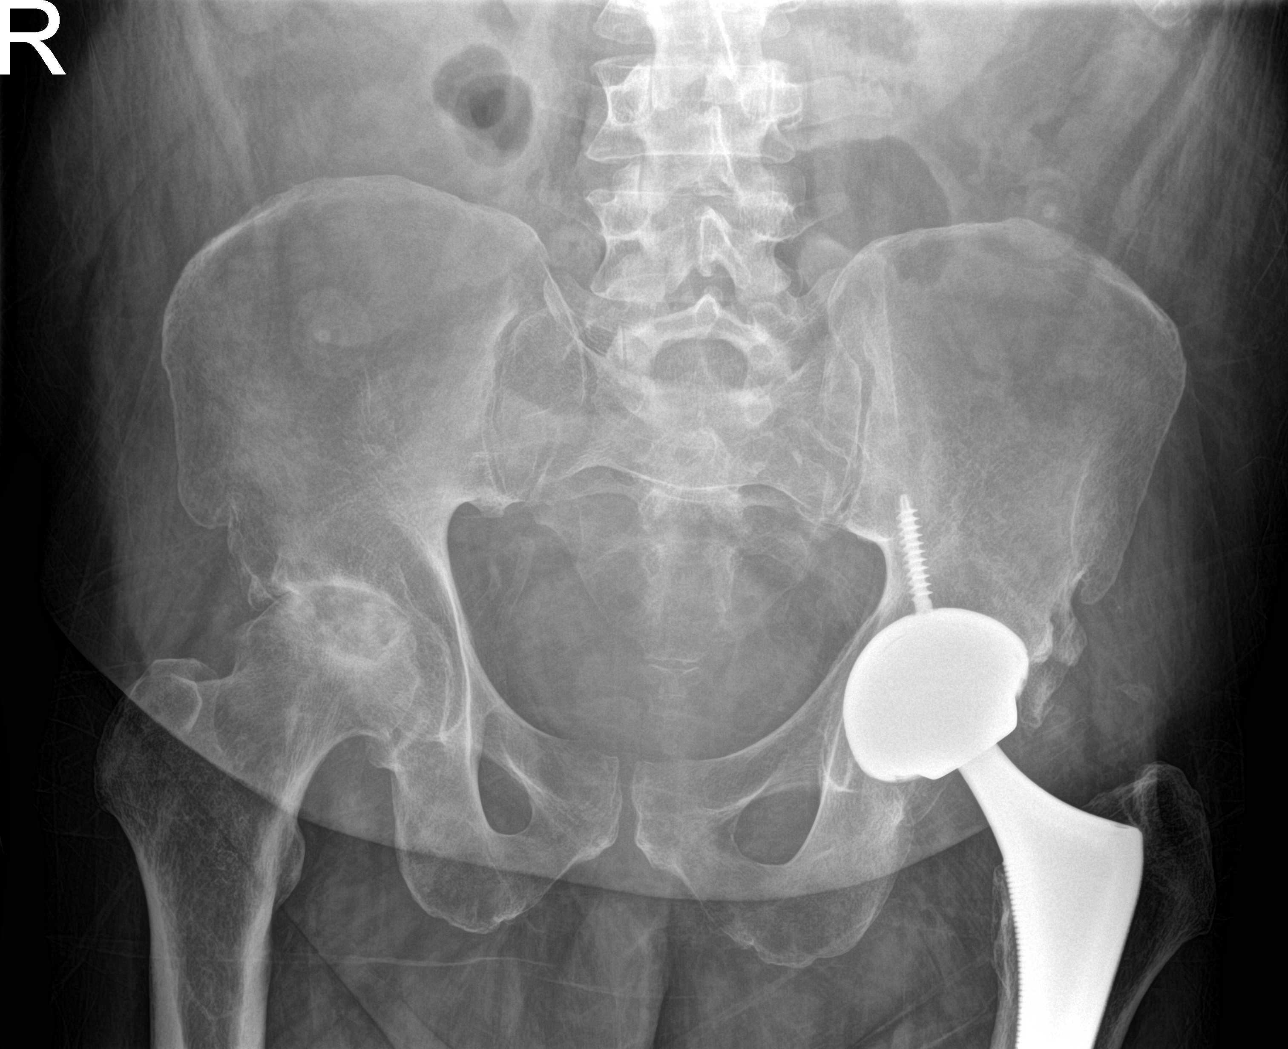
[im 2/3]
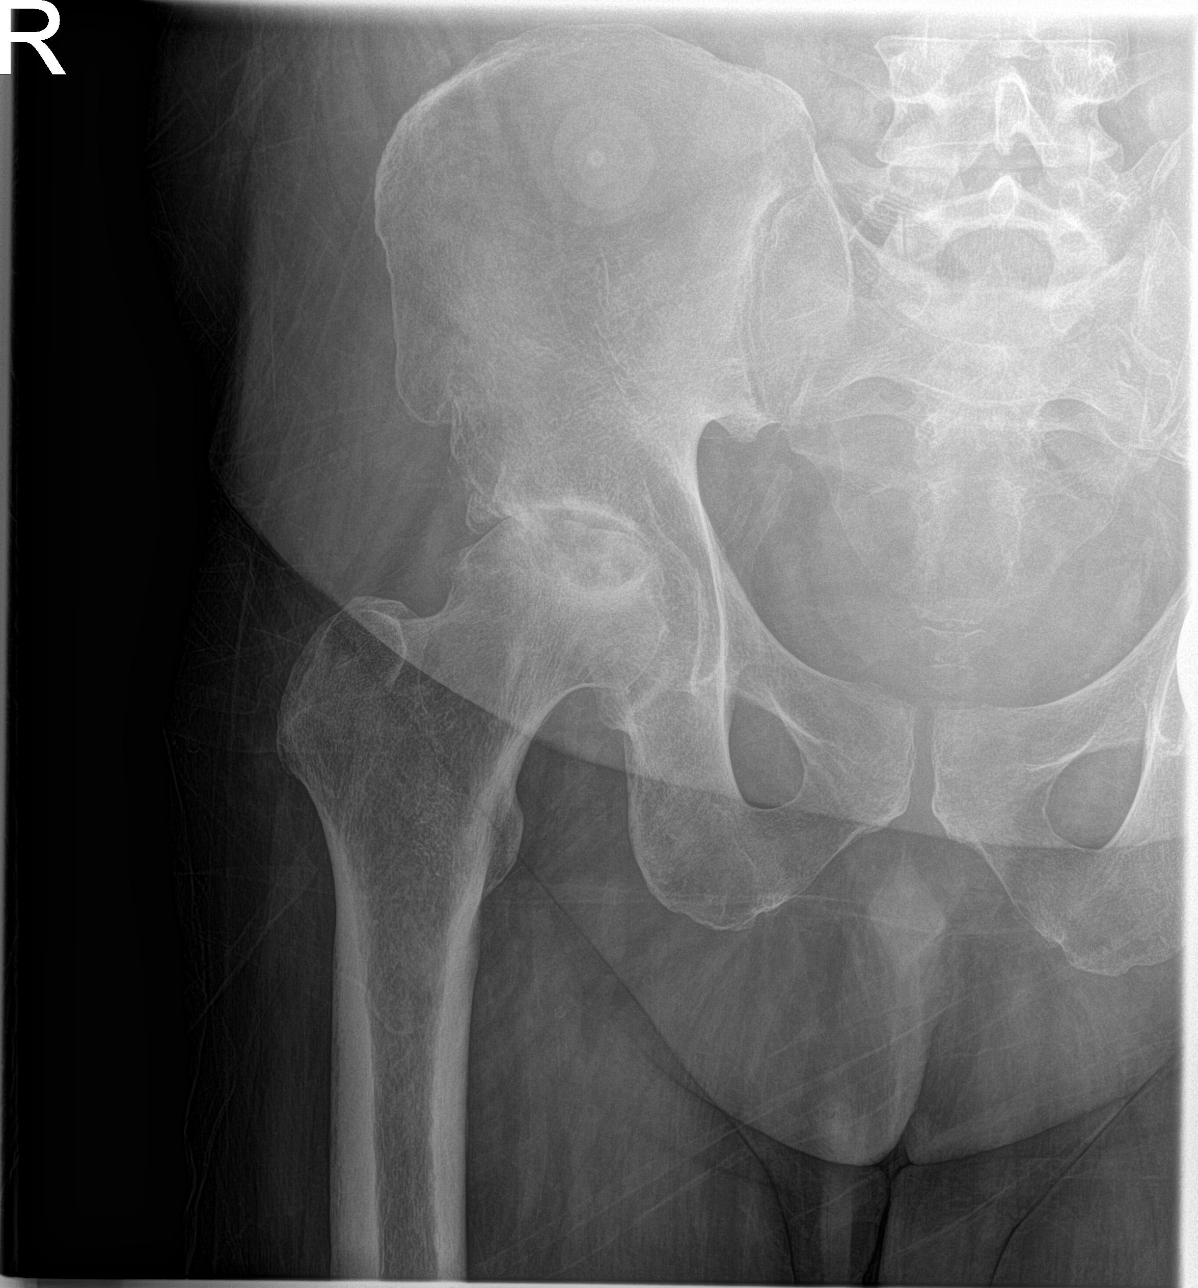
[im 3/3]
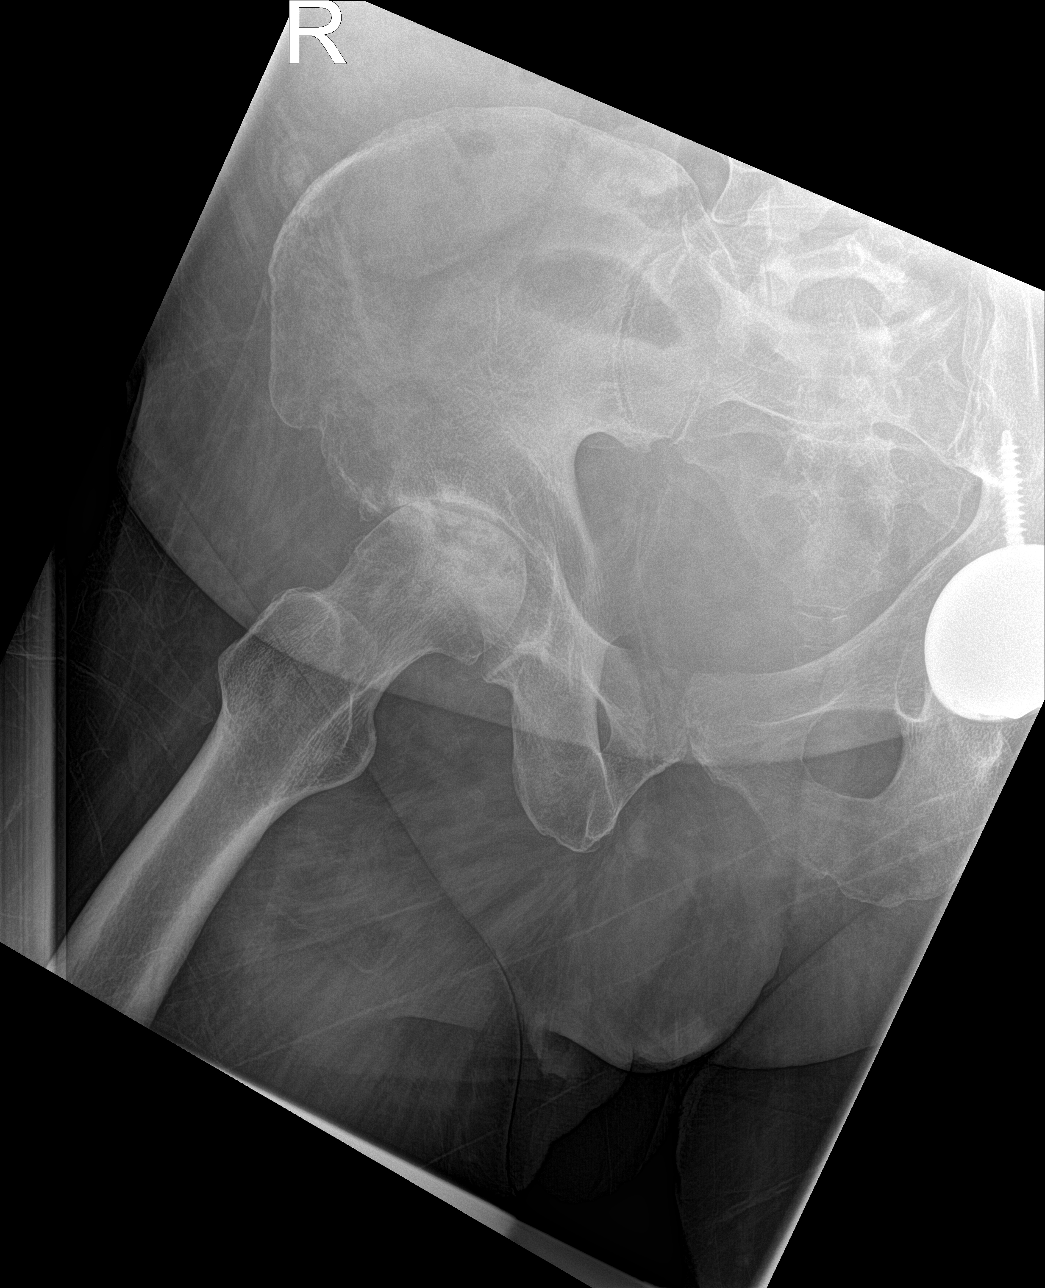

[3 of 3 positions shown; findings below may reference images not displayed]

FINDINGS: There are changes consistent with prior left hip replacement.
Significant remodeling of the right femoral head is noted consistent
with avascular necrosis. Subchondral sclerosis in the acetabulum is
seen. No acute fracture or dislocation is noted. The pelvic ring is
intact.
IMPRESSION: Changes consistent with avascular necrosis. No acute abnormality is
noted.

## 2017-01-06 IMAGING — US US RENAL
1 series · 14 of 25 positions shown · non-contrast
Comparison: No previous renal ultrasound available for comparison.
Comparison is made to an abdomen CT dated 07/03/2015.

CLINICAL DATA: Renal failure.

EXAM:
RENAL / URINARY TRACT ULTRASOUND COMPLETE

[Series 1: us renal · 0.25mm/px · 14 of 32 slices shown]
[im 1/32]
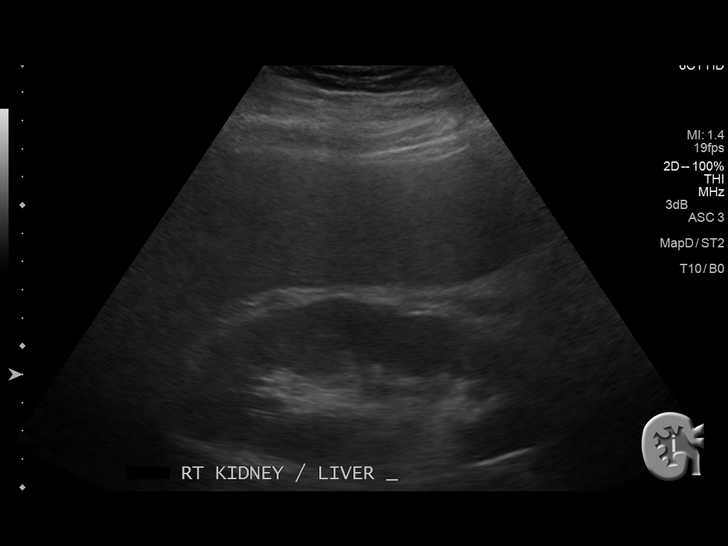
[im 3/32]
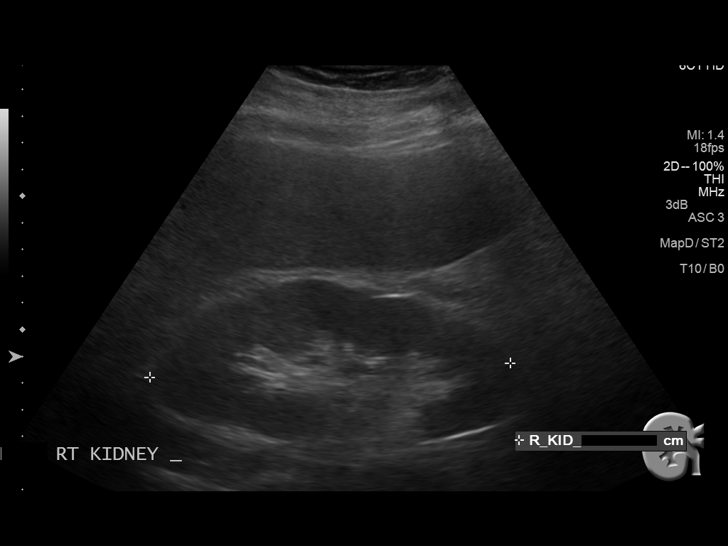
[im 6/32]
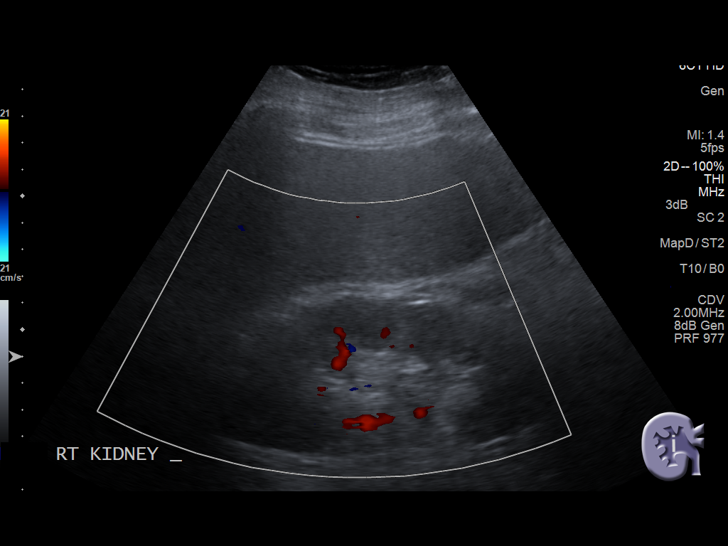
[im 8/32]
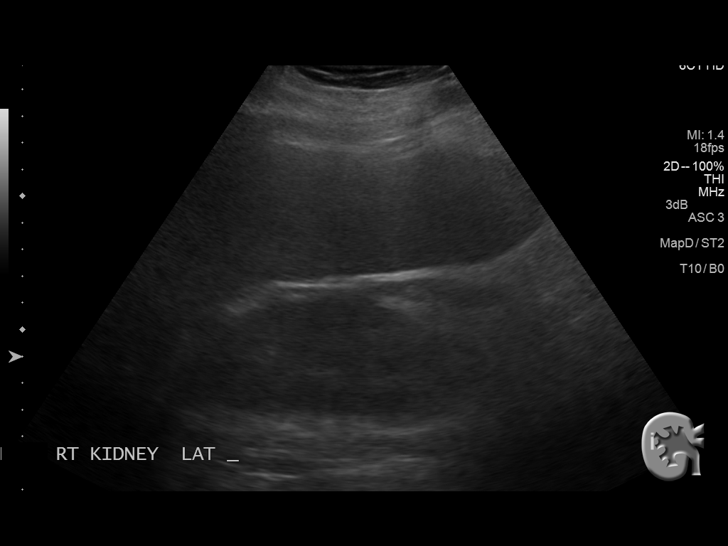
[im 11/32]
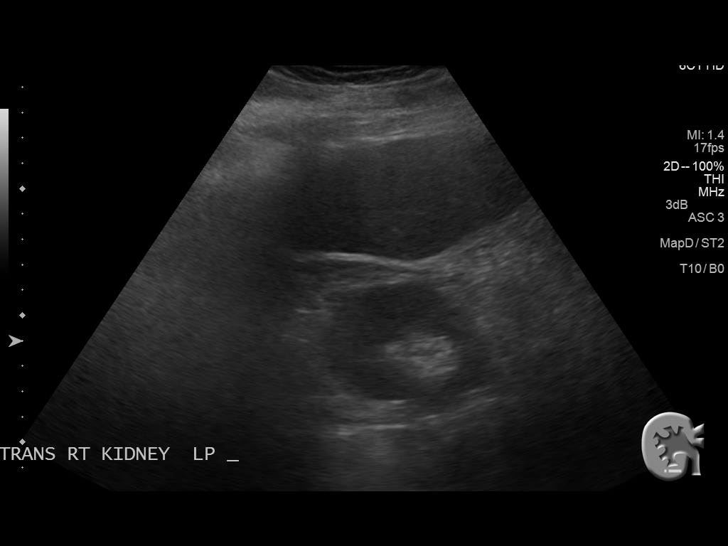
[im 12/32]
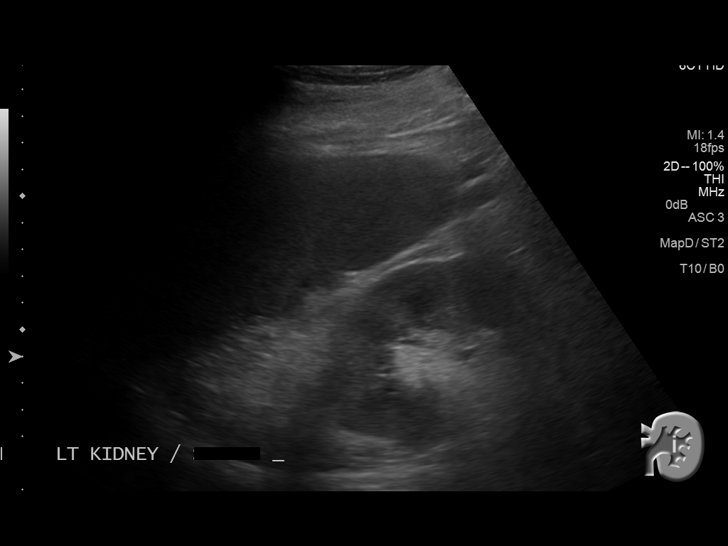
[im 15/32]
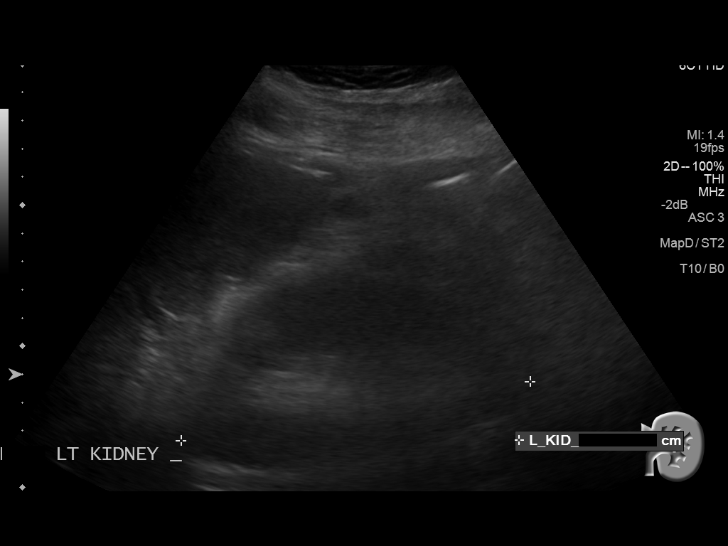
[im 17/32]
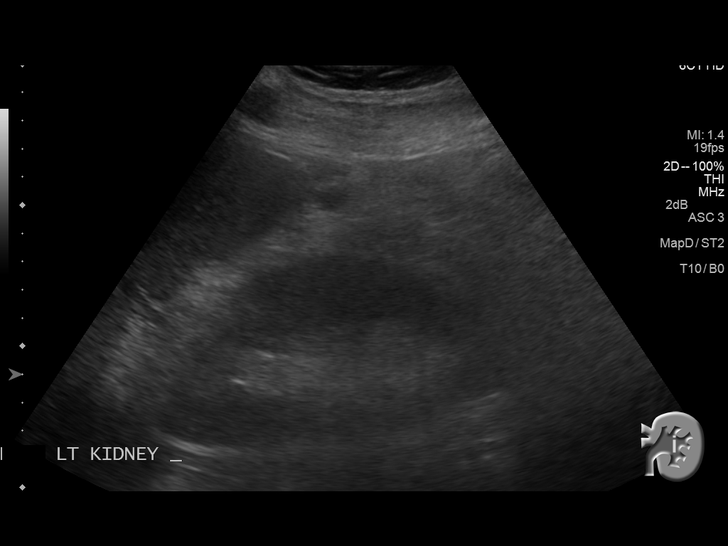
[im 20/32]
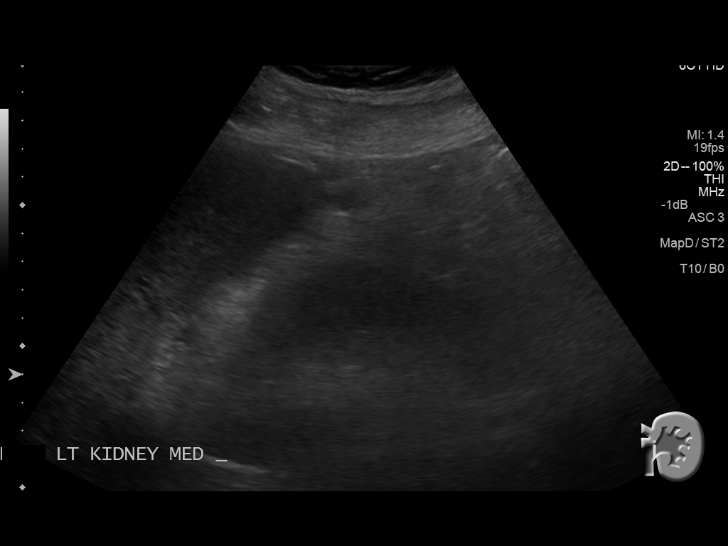
[im 21/32]
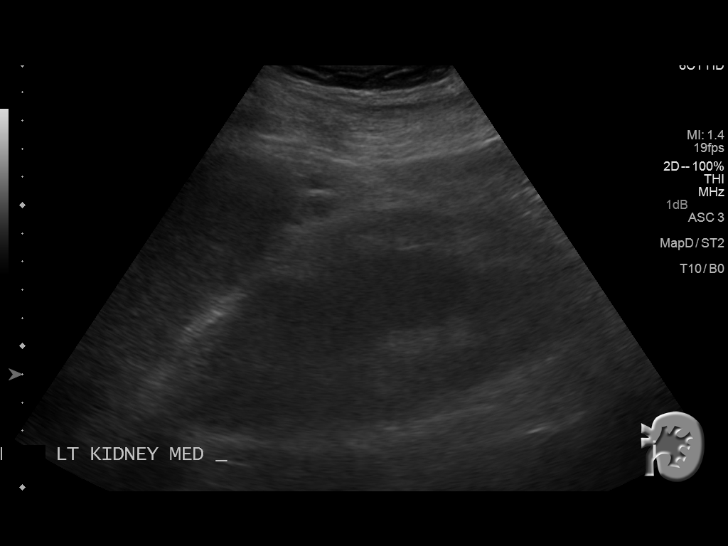
[im 24/32]
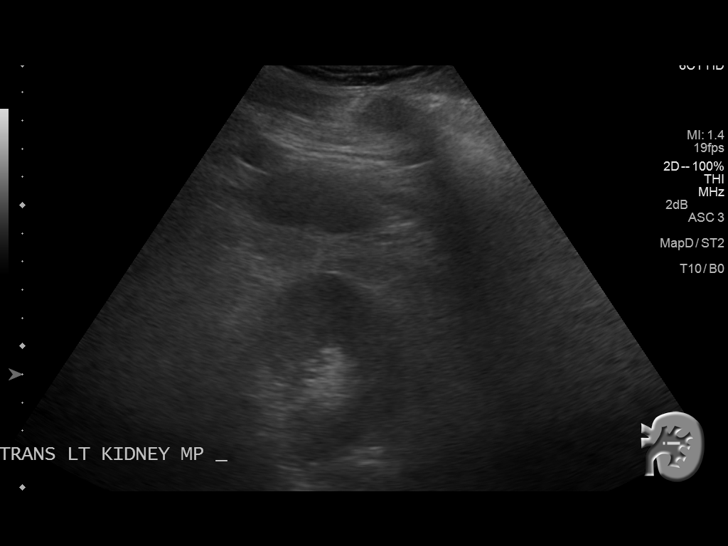
[im 26/32]
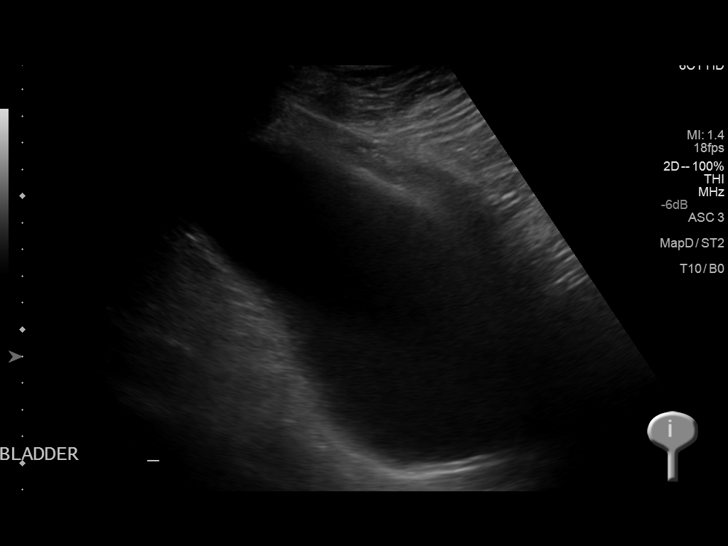
[im 29/32]
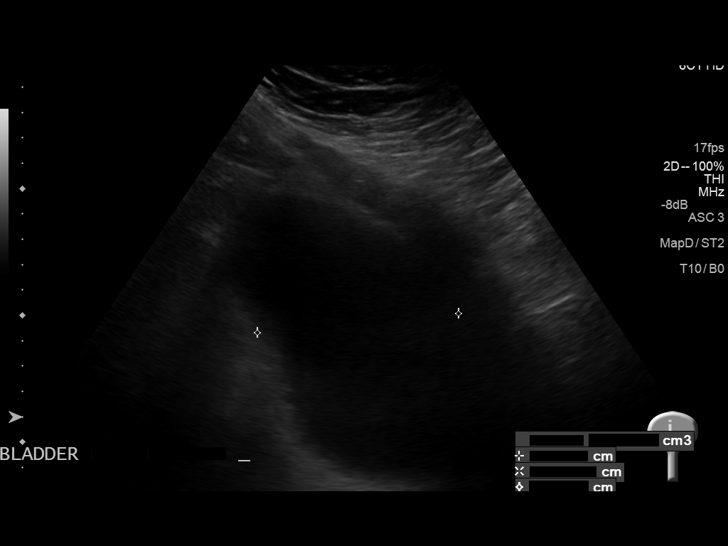
[im 32/32]
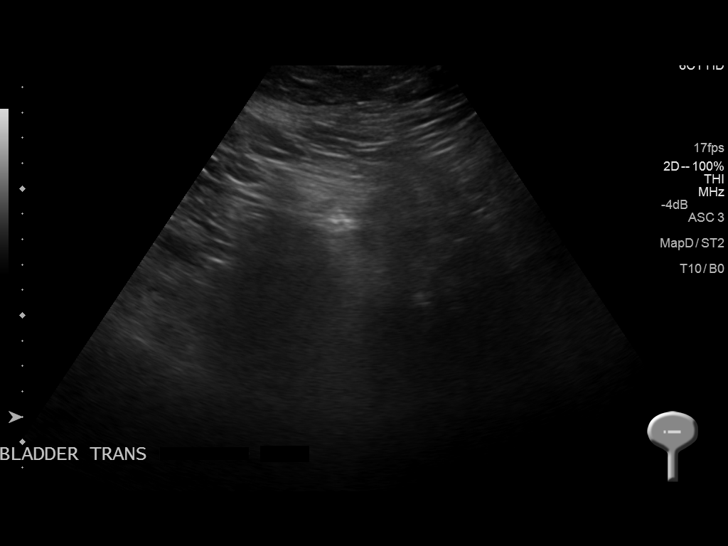

[14 of 25 positions shown; findings below may reference images not displayed]

FINDINGS: Right Kidney:

Length: 13.5 cm. Right kidney is normal in size and echogenicity
without mass, cyst, stone, or hydronephrosis. Blood flow is shown
within the right renal hilum.

Left Kidney:

Length: 12.6 cm.. Left kidney is normal in size and echogenicity
without mass, cyst, stone, or hydronephrosis. Blood flow is shown
within the left renal hilum.

Bladder:

Bladder appears normal. Prevoid bladder volume measured at 508 cc.
Final images show no significant postvoid residual.
IMPRESSION: Normal renal ultrasound.
